# Patient Record
Sex: Male | Born: 1940 | Race: White | Hispanic: No | Marital: Married | Smoking: Never smoker
Health system: Southern US, Community
[De-identification: ages and names within clinical notes are randomized; demographics above are authoritative.]

## PROBLEM LIST (undated history)

## (undated) DIAGNOSIS — C4491 Basal cell carcinoma of skin, unspecified: Secondary | ICD-10-CM

## (undated) DIAGNOSIS — IMO0002 Reserved for concepts with insufficient information to code with codable children: Secondary | ICD-10-CM

## (undated) DIAGNOSIS — Z8546 Personal history of malignant neoplasm of prostate: Secondary | ICD-10-CM

## (undated) DIAGNOSIS — Z860101 Personal history of adenomatous and serrated colon polyps: Secondary | ICD-10-CM

## (undated) DIAGNOSIS — K648 Other hemorrhoids: Secondary | ICD-10-CM

## (undated) DIAGNOSIS — Z86010 Personal history of colonic polyps: Secondary | ICD-10-CM

## (undated) DIAGNOSIS — S0300XA Dislocation of jaw, unspecified side, initial encounter: Secondary | ICD-10-CM

## (undated) DIAGNOSIS — E785 Hyperlipidemia, unspecified: Secondary | ICD-10-CM

## (undated) DIAGNOSIS — K7689 Other specified diseases of liver: Secondary | ICD-10-CM

## (undated) DIAGNOSIS — K222 Esophageal obstruction: Secondary | ICD-10-CM

## (undated) DIAGNOSIS — K5790 Diverticulosis of intestine, part unspecified, without perforation or abscess without bleeding: Secondary | ICD-10-CM

## (undated) DIAGNOSIS — K2970 Gastritis, unspecified, without bleeding: Secondary | ICD-10-CM

## (undated) DIAGNOSIS — K219 Gastro-esophageal reflux disease without esophagitis: Secondary | ICD-10-CM

## (undated) DIAGNOSIS — R911 Solitary pulmonary nodule: Secondary | ICD-10-CM

## (undated) HISTORY — PX: MOHS SURGERY: SUR867

## (undated) HISTORY — DX: Gastro-esophageal reflux disease without esophagitis: K21.9

## (undated) HISTORY — DX: Reserved for concepts with insufficient information to code with codable children: IMO0002

## (undated) HISTORY — DX: Personal history of adenomatous and serrated colon polyps: Z86.0101

## (undated) HISTORY — PX: APPENDECTOMY: SHX54

## (undated) HISTORY — PX: ROTATOR CUFF REPAIR: SHX139

## (undated) HISTORY — DX: Personal history of malignant neoplasm of prostate: Z85.46

## (undated) HISTORY — DX: Gilbert syndrome: E80.4

## (undated) HISTORY — DX: Dislocation of jaw, unspecified side, initial encounter: S03.00XA

## (undated) HISTORY — DX: Gastritis, unspecified, without bleeding: K29.70

## (undated) HISTORY — DX: Basal cell carcinoma of skin, unspecified: C44.91

## (undated) HISTORY — DX: Other specified diseases of liver: K76.89

## (undated) HISTORY — DX: Solitary pulmonary nodule: R91.1

## (undated) HISTORY — DX: Diverticulosis of intestine, part unspecified, without perforation or abscess without bleeding: K57.90

## (undated) HISTORY — DX: Personal history of colonic polyps: Z86.010

## (undated) HISTORY — DX: Esophageal obstruction: K22.2

## (undated) HISTORY — DX: Hyperlipidemia, unspecified: E78.5

## (undated) HISTORY — DX: Other hemorrhoids: K64.8

---

## 1983-06-11 HISTORY — PX: CHOLECYSTECTOMY: SHX55

## 2000-04-22 ENCOUNTER — Ambulatory Visit (HOSPITAL_COMMUNITY): Admission: RE | Admit: 2000-04-22 | Discharge: 2000-04-22 | Payer: Self-pay | Admitting: Gastroenterology

## 2000-04-23 ENCOUNTER — Encounter (INDEPENDENT_AMBULATORY_CARE_PROVIDER_SITE_OTHER): Payer: Self-pay | Admitting: *Deleted

## 2000-09-27 ENCOUNTER — Emergency Department (HOSPITAL_COMMUNITY): Admission: EM | Admit: 2000-09-27 | Discharge: 2000-09-28 | Payer: Self-pay | Admitting: Emergency Medicine

## 2000-09-28 ENCOUNTER — Encounter: Payer: Self-pay | Admitting: Internal Medicine

## 2000-10-29 ENCOUNTER — Observation Stay (HOSPITAL_COMMUNITY): Admission: EM | Admit: 2000-10-29 | Discharge: 2000-10-30 | Payer: Self-pay | Admitting: Emergency Medicine

## 2000-10-29 ENCOUNTER — Encounter (INDEPENDENT_AMBULATORY_CARE_PROVIDER_SITE_OTHER): Payer: Self-pay | Admitting: Specialist

## 2002-06-10 HISTORY — PX: COLONOSCOPY W/ POLYPECTOMY: SHX1380

## 2002-06-10 HISTORY — PX: ESOPHAGEAL DILATION: SHX303

## 2004-02-09 ENCOUNTER — Encounter: Payer: Self-pay | Admitting: Internal Medicine

## 2004-04-30 ENCOUNTER — Ambulatory Visit: Payer: Self-pay | Admitting: Internal Medicine

## 2004-06-28 ENCOUNTER — Ambulatory Visit: Payer: Self-pay | Admitting: Internal Medicine

## 2004-08-14 ENCOUNTER — Ambulatory Visit: Payer: Self-pay | Admitting: Internal Medicine

## 2004-09-27 ENCOUNTER — Ambulatory Visit: Payer: Self-pay | Admitting: Internal Medicine

## 2004-10-03 ENCOUNTER — Ambulatory Visit: Payer: Self-pay | Admitting: Internal Medicine

## 2005-03-19 ENCOUNTER — Ambulatory Visit: Payer: Self-pay | Admitting: Internal Medicine

## 2005-04-09 ENCOUNTER — Ambulatory Visit: Payer: Self-pay | Admitting: Internal Medicine

## 2005-05-23 ENCOUNTER — Ambulatory Visit: Payer: Self-pay | Admitting: Internal Medicine

## 2005-05-29 ENCOUNTER — Encounter: Admission: RE | Admit: 2005-05-29 | Discharge: 2005-05-29 | Payer: Self-pay | Admitting: Internal Medicine

## 2005-07-11 HISTORY — PX: PROSTATECTOMY: SHX69

## 2005-08-01 ENCOUNTER — Inpatient Hospital Stay (HOSPITAL_COMMUNITY): Admission: RE | Admit: 2005-08-01 | Discharge: 2005-08-02 | Payer: Self-pay | Admitting: Urology

## 2005-08-01 ENCOUNTER — Encounter (INDEPENDENT_AMBULATORY_CARE_PROVIDER_SITE_OTHER): Payer: Self-pay | Admitting: *Deleted

## 2005-10-02 ENCOUNTER — Ambulatory Visit: Payer: Self-pay | Admitting: Internal Medicine

## 2005-11-18 ENCOUNTER — Ambulatory Visit: Payer: Self-pay | Admitting: Internal Medicine

## 2006-01-23 ENCOUNTER — Ambulatory Visit: Payer: Self-pay | Admitting: Internal Medicine

## 2006-02-19 ENCOUNTER — Ambulatory Visit: Payer: Self-pay | Admitting: Internal Medicine

## 2006-02-25 ENCOUNTER — Ambulatory Visit: Payer: Self-pay | Admitting: Internal Medicine

## 2006-07-14 ENCOUNTER — Ambulatory Visit: Payer: Self-pay | Admitting: Internal Medicine

## 2006-08-18 ENCOUNTER — Ambulatory Visit: Payer: Self-pay | Admitting: Internal Medicine

## 2006-08-25 ENCOUNTER — Encounter: Admission: RE | Admit: 2006-08-25 | Discharge: 2006-08-25 | Payer: Self-pay | Admitting: Internal Medicine

## 2006-10-06 ENCOUNTER — Ambulatory Visit: Payer: Self-pay | Admitting: Internal Medicine

## 2006-10-06 LAB — CONVERTED CEMR LAB
Alkaline Phosphatase: 66 units/L (ref 39–117)
Basophils Relative: 0.4 % (ref 0.0–1.0)
Bilirubin, Direct: 0.1 mg/dL (ref 0.0–0.3)
CO2: 31 meq/L (ref 19–32)
Creatinine, Ser: 1 mg/dL (ref 0.4–1.5)
Eosinophils Relative: 7.5 % — ABNORMAL HIGH (ref 0.0–5.0)
Glucose, Bld: 97 mg/dL (ref 70–99)
HCT: 41.8 % (ref 39.0–52.0)
Hemoglobin: 14.3 g/dL (ref 13.0–17.0)
LDL Cholesterol: 58 mg/dL (ref 0–99)
Lymphocytes Relative: 28.5 % (ref 12.0–46.0)
Monocytes Absolute: 0.5 10*3/uL (ref 0.2–0.7)
Neutrophils Relative %: 52.8 % (ref 43.0–77.0)
Potassium: 4.4 meq/L (ref 3.5–5.1)
RDW: 12.2 % (ref 11.5–14.6)
Sodium: 144 meq/L (ref 135–145)
TSH: 1.27 microintl units/mL (ref 0.35–5.50)
Total Bilirubin: 1 mg/dL (ref 0.3–1.2)
Total Protein: 6.6 g/dL (ref 6.0–8.3)
VLDL: 22 mg/dL (ref 0–40)

## 2006-10-20 ENCOUNTER — Encounter: Admission: RE | Admit: 2006-10-20 | Discharge: 2006-10-20 | Payer: Self-pay | Admitting: Specialist

## 2006-10-29 DIAGNOSIS — C4490 Unspecified malignant neoplasm of skin, unspecified: Secondary | ICD-10-CM | POA: Insufficient documentation

## 2006-12-08 ENCOUNTER — Ambulatory Visit: Payer: Self-pay | Admitting: Family Medicine

## 2007-02-12 ENCOUNTER — Telehealth (INDEPENDENT_AMBULATORY_CARE_PROVIDER_SITE_OTHER): Payer: Self-pay | Admitting: *Deleted

## 2007-03-03 ENCOUNTER — Ambulatory Visit: Payer: Self-pay | Admitting: Internal Medicine

## 2007-03-03 DIAGNOSIS — E785 Hyperlipidemia, unspecified: Secondary | ICD-10-CM | POA: Insufficient documentation

## 2007-03-23 ENCOUNTER — Encounter: Payer: Self-pay | Admitting: Internal Medicine

## 2007-04-21 ENCOUNTER — Ambulatory Visit: Payer: Self-pay | Admitting: Internal Medicine

## 2007-06-01 ENCOUNTER — Telehealth (INDEPENDENT_AMBULATORY_CARE_PROVIDER_SITE_OTHER): Payer: Self-pay | Admitting: *Deleted

## 2007-06-19 ENCOUNTER — Ambulatory Visit: Payer: Self-pay | Admitting: Internal Medicine

## 2007-06-29 LAB — CONVERTED CEMR LAB
Basophils Absolute: 0 10*3/uL (ref 0.0–0.1)
Eosinophils Absolute: 0.2 10*3/uL (ref 0.0–0.6)
Eosinophils Relative: 3.5 % (ref 0.0–5.0)
MCV: 95.9 fL (ref 78.0–100.0)
Platelets: 163 10*3/uL (ref 150–400)
RBC: 4.17 M/uL — ABNORMAL LOW (ref 4.22–5.81)
TSH: 1.52 microintl units/mL (ref 0.35–5.50)
WBC: 5.4 10*3/uL (ref 4.5–10.5)

## 2007-08-03 ENCOUNTER — Emergency Department (HOSPITAL_COMMUNITY): Admission: EM | Admit: 2007-08-03 | Discharge: 2007-08-03 | Payer: Self-pay | Admitting: Family Medicine

## 2007-09-01 ENCOUNTER — Ambulatory Visit: Payer: Self-pay | Admitting: Internal Medicine

## 2007-09-03 ENCOUNTER — Ambulatory Visit: Payer: Self-pay | Admitting: Internal Medicine

## 2007-09-03 DIAGNOSIS — K219 Gastro-esophageal reflux disease without esophagitis: Secondary | ICD-10-CM | POA: Insufficient documentation

## 2007-09-04 ENCOUNTER — Encounter (INDEPENDENT_AMBULATORY_CARE_PROVIDER_SITE_OTHER): Payer: Self-pay | Admitting: *Deleted

## 2007-09-07 ENCOUNTER — Encounter (INDEPENDENT_AMBULATORY_CARE_PROVIDER_SITE_OTHER): Payer: Self-pay | Admitting: *Deleted

## 2007-09-14 ENCOUNTER — Encounter: Payer: Self-pay | Admitting: Internal Medicine

## 2007-09-14 ENCOUNTER — Ambulatory Visit: Payer: Self-pay

## 2007-09-17 ENCOUNTER — Ambulatory Visit: Payer: Self-pay | Admitting: Internal Medicine

## 2007-09-17 ENCOUNTER — Encounter: Payer: Self-pay | Admitting: Internal Medicine

## 2007-09-17 ENCOUNTER — Encounter (INDEPENDENT_AMBULATORY_CARE_PROVIDER_SITE_OTHER): Payer: Self-pay | Admitting: *Deleted

## 2007-09-22 ENCOUNTER — Encounter: Payer: Self-pay | Admitting: Internal Medicine

## 2007-11-09 ENCOUNTER — Ambulatory Visit: Payer: Self-pay | Admitting: Internal Medicine

## 2007-11-10 ENCOUNTER — Ambulatory Visit: Payer: Self-pay | Admitting: Internal Medicine

## 2007-11-10 ENCOUNTER — Encounter: Payer: Self-pay | Admitting: Family Medicine

## 2007-11-12 ENCOUNTER — Encounter (INDEPENDENT_AMBULATORY_CARE_PROVIDER_SITE_OTHER): Payer: Self-pay | Admitting: *Deleted

## 2007-11-13 LAB — CONVERTED CEMR LAB
Magnesium: 2.8 mg/dL — ABNORMAL HIGH (ref 1.5–2.5)
Total CK: 103 units/L (ref 7–195)

## 2007-11-16 ENCOUNTER — Ambulatory Visit: Payer: Self-pay | Admitting: Internal Medicine

## 2007-11-16 LAB — CONVERTED CEMR LAB
Bilirubin Urine: NEGATIVE
Creatinine, Ser: 1 mg/dL (ref 0.4–1.5)
Eosinophils Relative: 4.5 % (ref 0.0–5.0)
Glucose, Urine, Semiquant: NEGATIVE
Monocytes Relative: 6.1 % (ref 3.0–12.0)
Neutrophils Relative %: 62 % (ref 43.0–77.0)
Platelets: 148 10*3/uL — ABNORMAL LOW (ref 150–400)
Specific Gravity, Urine: 1.02
WBC: 5.3 10*3/uL (ref 4.5–10.5)
pH: 6.5

## 2007-11-17 ENCOUNTER — Ambulatory Visit: Payer: Self-pay | Admitting: Internal Medicine

## 2007-12-01 ENCOUNTER — Ambulatory Visit: Payer: Self-pay | Admitting: Internal Medicine

## 2007-12-01 DIAGNOSIS — K7689 Other specified diseases of liver: Secondary | ICD-10-CM | POA: Insufficient documentation

## 2007-12-01 DIAGNOSIS — R930 Abnormal findings on diagnostic imaging of skull and head, not elsewhere classified: Secondary | ICD-10-CM | POA: Insufficient documentation

## 2008-01-12 ENCOUNTER — Ambulatory Visit: Payer: Self-pay | Admitting: Internal Medicine

## 2008-01-12 DIAGNOSIS — Z8546 Personal history of malignant neoplasm of prostate: Secondary | ICD-10-CM | POA: Insufficient documentation

## 2008-01-12 LAB — CONVERTED CEMR LAB
Cholesterol, target level: 200 mg/dL
HDL goal, serum: 40 mg/dL

## 2008-01-18 LAB — CONVERTED CEMR LAB
ALT: 30 units/L (ref 0–53)
AST: 29 units/L (ref 0–37)
Albumin: 4.1 g/dL (ref 3.5–5.2)
BUN: 16 mg/dL (ref 6–23)
Basophils Absolute: 0.1 10*3/uL (ref 0.0–0.1)
Basophils Relative: 2 % (ref 0.0–3.0)
CO2: 28 meq/L (ref 19–32)
Calcium: 9.2 mg/dL (ref 8.4–10.5)
Cholesterol: 149 mg/dL (ref 0–200)
Creatinine, Ser: 1 mg/dL (ref 0.4–1.5)
Eosinophils Relative: 3.7 % (ref 0.0–5.0)
Hemoglobin: 15.7 g/dL (ref 13.0–17.0)
LDL Cholesterol: 82 mg/dL (ref 0–99)
Lymphocytes Relative: 29 % (ref 12.0–46.0)
MCHC: 34 g/dL (ref 30.0–36.0)
MCV: 98.3 fL (ref 78.0–100.0)
Neutro Abs: 2.8 10*3/uL (ref 1.4–7.7)
RBC: 4.68 M/uL (ref 4.22–5.81)
TSH: 1.36 microintl units/mL (ref 0.35–5.50)
Total Bilirubin: 1.8 mg/dL — ABNORMAL HIGH (ref 0.3–1.2)
Total Protein: 7.2 g/dL (ref 6.0–8.3)
WBC: 5.1 10*3/uL (ref 4.5–10.5)

## 2008-03-01 ENCOUNTER — Telehealth (INDEPENDENT_AMBULATORY_CARE_PROVIDER_SITE_OTHER): Payer: Self-pay | Admitting: *Deleted

## 2008-03-16 ENCOUNTER — Ambulatory Visit: Payer: Self-pay | Admitting: Internal Medicine

## 2008-03-23 ENCOUNTER — Encounter: Payer: Self-pay | Admitting: Internal Medicine

## 2008-04-08 ENCOUNTER — Ambulatory Visit: Payer: Self-pay | Admitting: Family Medicine

## 2008-04-08 LAB — CONVERTED CEMR LAB
Glucose, Urine, Semiquant: NEGATIVE
Nitrite: NEGATIVE
Specific Gravity, Urine: <1.005
WBC Urine, dipstick: NEGATIVE
pH: 6.5

## 2008-05-12 ENCOUNTER — Ambulatory Visit: Payer: Self-pay | Admitting: Internal Medicine

## 2008-05-12 LAB — CONVERTED CEMR LAB
OCCULT 1: NEGATIVE
OCCULT 2: NEGATIVE

## 2008-05-13 ENCOUNTER — Telehealth: Payer: Self-pay | Admitting: Internal Medicine

## 2008-05-13 DIAGNOSIS — H4040X0 Glaucoma secondary to eye inflammation, unspecified eye, stage unspecified: Secondary | ICD-10-CM | POA: Insufficient documentation

## 2008-05-17 ENCOUNTER — Telehealth: Payer: Self-pay | Admitting: Internal Medicine

## 2008-05-18 DIAGNOSIS — Z860100 Personal history of colon polyps, unspecified: Secondary | ICD-10-CM | POA: Insufficient documentation

## 2008-05-18 DIAGNOSIS — K648 Other hemorrhoids: Secondary | ICD-10-CM | POA: Insufficient documentation

## 2008-05-18 DIAGNOSIS — K222 Esophageal obstruction: Secondary | ICD-10-CM | POA: Insufficient documentation

## 2008-05-18 DIAGNOSIS — K5730 Diverticulosis of large intestine without perforation or abscess without bleeding: Secondary | ICD-10-CM | POA: Insufficient documentation

## 2008-05-19 ENCOUNTER — Ambulatory Visit: Payer: Self-pay | Admitting: Internal Medicine

## 2008-05-19 DIAGNOSIS — J984 Other disorders of lung: Secondary | ICD-10-CM | POA: Insufficient documentation

## 2008-05-19 LAB — CONVERTED CEMR LAB
ALT: 26 units/L (ref 0–53)
Albumin: 3.9 g/dL (ref 3.5–5.2)
Amylase: 115 units/L (ref 27–131)
Creatinine, Ser: 1.1 mg/dL (ref 0.4–1.5)
Total Protein: 7.1 g/dL (ref 6.0–8.3)

## 2008-05-23 ENCOUNTER — Ambulatory Visit: Payer: Self-pay | Admitting: Internal Medicine

## 2008-05-30 ENCOUNTER — Telehealth (INDEPENDENT_AMBULATORY_CARE_PROVIDER_SITE_OTHER): Payer: Self-pay | Admitting: *Deleted

## 2008-06-23 ENCOUNTER — Ambulatory Visit: Payer: Self-pay | Admitting: Internal Medicine

## 2008-08-15 ENCOUNTER — Encounter: Payer: Self-pay | Admitting: Internal Medicine

## 2008-08-26 ENCOUNTER — Ambulatory Visit: Payer: Self-pay | Admitting: Internal Medicine

## 2008-08-26 LAB — CONVERTED CEMR LAB
AST: 24 units/L (ref 0–37)
Albumin: 3.9 g/dL (ref 3.5–5.2)
Alkaline Phosphatase: 76 units/L (ref 39–117)
LDL Cholesterol: 75 mg/dL (ref 0–99)
Total Bilirubin: 1.4 mg/dL — ABNORMAL HIGH (ref 0.3–1.2)
Total CHOL/HDL Ratio: 4

## 2008-09-02 ENCOUNTER — Ambulatory Visit: Payer: Self-pay | Admitting: Internal Medicine

## 2008-09-21 ENCOUNTER — Encounter: Payer: Self-pay | Admitting: Internal Medicine

## 2008-12-15 ENCOUNTER — Encounter: Payer: Self-pay | Admitting: Internal Medicine

## 2008-12-27 ENCOUNTER — Telehealth (INDEPENDENT_AMBULATORY_CARE_PROVIDER_SITE_OTHER): Payer: Self-pay | Admitting: *Deleted

## 2008-12-28 ENCOUNTER — Encounter (INDEPENDENT_AMBULATORY_CARE_PROVIDER_SITE_OTHER): Payer: Self-pay | Admitting: *Deleted

## 2009-01-17 ENCOUNTER — Ambulatory Visit: Payer: Self-pay | Admitting: Internal Medicine

## 2009-01-17 DIAGNOSIS — M26629 Arthralgia of temporomandibular joint, unspecified side: Secondary | ICD-10-CM | POA: Insufficient documentation

## 2009-01-17 DIAGNOSIS — H9190 Unspecified hearing loss, unspecified ear: Secondary | ICD-10-CM | POA: Insufficient documentation

## 2009-01-20 ENCOUNTER — Encounter (INDEPENDENT_AMBULATORY_CARE_PROVIDER_SITE_OTHER): Payer: Self-pay | Admitting: *Deleted

## 2009-03-29 ENCOUNTER — Encounter: Payer: Self-pay | Admitting: Internal Medicine

## 2009-03-30 ENCOUNTER — Encounter: Payer: Self-pay | Admitting: Internal Medicine

## 2009-03-30 ENCOUNTER — Ambulatory Visit (HOSPITAL_COMMUNITY): Admission: RE | Admit: 2009-03-30 | Discharge: 2009-03-30 | Payer: Self-pay | Admitting: Internal Medicine

## 2009-04-06 ENCOUNTER — Ambulatory Visit: Payer: Self-pay | Admitting: Internal Medicine

## 2009-05-16 ENCOUNTER — Encounter: Payer: Self-pay | Admitting: Internal Medicine

## 2009-06-06 ENCOUNTER — Ambulatory Visit: Payer: Self-pay | Admitting: Internal Medicine

## 2009-07-21 ENCOUNTER — Ambulatory Visit: Payer: Self-pay | Admitting: Family Medicine

## 2009-07-24 ENCOUNTER — Ambulatory Visit: Payer: Self-pay | Admitting: Internal Medicine

## 2009-08-29 ENCOUNTER — Ambulatory Visit: Payer: Self-pay | Admitting: Internal Medicine

## 2009-08-30 ENCOUNTER — Telehealth (INDEPENDENT_AMBULATORY_CARE_PROVIDER_SITE_OTHER): Payer: Self-pay | Admitting: *Deleted

## 2009-10-06 ENCOUNTER — Encounter: Payer: Self-pay | Admitting: Internal Medicine

## 2009-10-17 ENCOUNTER — Encounter: Payer: Self-pay | Admitting: Internal Medicine

## 2009-10-26 ENCOUNTER — Telehealth (INDEPENDENT_AMBULATORY_CARE_PROVIDER_SITE_OTHER): Payer: Self-pay | Admitting: *Deleted

## 2010-01-15 ENCOUNTER — Ambulatory Visit: Payer: Self-pay | Admitting: Internal Medicine

## 2010-01-15 DIAGNOSIS — M940 Chondrocostal junction syndrome [Tietze]: Secondary | ICD-10-CM | POA: Insufficient documentation

## 2010-01-16 ENCOUNTER — Encounter: Payer: Self-pay | Admitting: Internal Medicine

## 2010-01-19 ENCOUNTER — Ambulatory Visit: Payer: Self-pay | Admitting: Internal Medicine

## 2010-01-22 ENCOUNTER — Telehealth: Payer: Self-pay | Admitting: Internal Medicine

## 2010-01-22 LAB — CONVERTED CEMR LAB
ALT: 29 units/L (ref 0–53)
AST: 29 units/L (ref 0–37)
Albumin: 4.3 g/dL (ref 3.5–5.2)
Alkaline Phosphatase: 63 units/L (ref 39–117)
Basophils Absolute: 0 10*3/uL (ref 0.0–0.1)
Calcium: 9.3 mg/dL (ref 8.4–10.5)
Eosinophils Relative: 3.3 % (ref 0.0–5.0)
GFR calc non Af Amer: 80.08 mL/min (ref >60)
Glucose, Bld: 95 mg/dL (ref 70–99)
HDL: 39.5 mg/dL (ref >39.00)
Hemoglobin: 15.3 g/dL (ref 13.0–17.0)
Lymphocytes Relative: 27.6 % (ref 12.0–46.0)
Monocytes Relative: 11.2 % (ref 3.0–12.0)
Neutro Abs: 2.8 10*3/uL (ref 1.4–7.7)
Potassium: 4.6 meq/L (ref 3.5–5.1)
RDW: 12.9 % (ref 11.5–14.6)
Sodium: 142 meq/L (ref 135–145)
Total Bilirubin: 1.6 mg/dL — ABNORMAL HIGH (ref 0.3–1.2)
Total CHOL/HDL Ratio: 4
Triglycerides: 225 mg/dL — ABNORMAL HIGH (ref 0.0–149.0)
VLDL: 45 mg/dL — ABNORMAL HIGH (ref 0.0–40.0)
WBC: 5 10*3/uL (ref 4.5–10.5)

## 2010-01-29 ENCOUNTER — Ambulatory Visit: Payer: Self-pay | Admitting: Internal Medicine

## 2010-02-08 ENCOUNTER — Ambulatory Visit: Payer: Self-pay | Admitting: Internal Medicine

## 2010-03-19 ENCOUNTER — Ambulatory Visit: Payer: Self-pay | Admitting: Internal Medicine

## 2010-03-19 DIAGNOSIS — R0789 Other chest pain: Secondary | ICD-10-CM | POA: Insufficient documentation

## 2010-03-20 ENCOUNTER — Telehealth (INDEPENDENT_AMBULATORY_CARE_PROVIDER_SITE_OTHER): Payer: Self-pay | Admitting: *Deleted

## 2010-05-24 ENCOUNTER — Ambulatory Visit: Payer: Self-pay | Admitting: Internal Medicine

## 2010-05-31 LAB — CONVERTED CEMR LAB
HDL: 35 mg/dL — ABNORMAL LOW (ref >39.00)
LDL Cholesterol: 86 mg/dL (ref 0–99)
Total CHOL/HDL Ratio: 4
Triglycerides: 104 mg/dL (ref 0.0–149.0)

## 2010-06-10 HISTORY — PX: OTHER SURGICAL HISTORY: SHX169

## 2010-06-15 ENCOUNTER — Telehealth (INDEPENDENT_AMBULATORY_CARE_PROVIDER_SITE_OTHER): Payer: Self-pay | Admitting: *Deleted

## 2010-06-20 ENCOUNTER — Encounter: Payer: Self-pay | Admitting: Internal Medicine

## 2010-07-04 ENCOUNTER — Other Ambulatory Visit: Payer: Self-pay | Admitting: Dermatology

## 2010-07-08 LAB — CONVERTED CEMR LAB
ALT: 27 units/L (ref 0–53)
AST: 29 units/L (ref 0–37)
Alkaline Phosphatase: 63 units/L (ref 39–117)
BUN: 14 mg/dL (ref 6–23)
Basophils Absolute: 0 10*3/uL (ref 0.0–0.1)
Bilirubin, Direct: 0.1 mg/dL (ref 0.0–0.3)
Calcium: 9.1 mg/dL (ref 8.4–10.5)
Cholesterol, target level: 200 mg/dL
Cholesterol: 166 mg/dL (ref 0–200)
Eosinophils Relative: 2.8 % (ref 0.0–5.0)
GFR calc non Af Amer: 78.46 mL/min (ref >60)
Glucose, Bld: 86 mg/dL (ref 70–99)
HCT: 44.7 % (ref 39.0–52.0)
HDL: 35.5 mg/dL — ABNORMAL LOW (ref >39.00)
LDL Cholesterol: 95 mg/dL (ref 0–99)
Lymphocytes Relative: 29.1 % (ref 12.0–46.0)
Monocytes Relative: 10.5 % (ref 3.0–12.0)
Platelets: 144 10*3/uL — ABNORMAL LOW (ref 150.0–400.0)
Potassium: 4 meq/L (ref 3.5–5.1)
RDW: 12.1 % (ref 11.5–14.6)
Sodium: 142 meq/L (ref 135–145)
TSH: 1.09 microintl units/mL (ref 0.35–5.50)
Total Bilirubin: 1.7 mg/dL — ABNORMAL HIGH (ref 0.3–1.2)
Troponin I: 0.02 ng/mL (ref <0.06)
VLDL: 35.2 mg/dL (ref 0.0–40.0)
WBC: 4.9 10*3/uL (ref 4.5–10.5)

## 2010-07-12 NOTE — Assessment & Plan Note (Signed)
Summary: cough, fever, headache//fd   Vital Signs:  Patient profile:   70 year old male Weight:      169.13 pounds Temp:     98.2 degrees F oral Pulse rate:   65 / minute Pulse rhythm:   regular BP sitting:   122 / 80  (left arm) Cuff size:   regular  Vitals Entered By: Army Fossa CMA (July 21, 2009 1:12 PM) CC: [name] (highest 101.2), chest congestion, and headache, Cough   History of Present Illness:  Cough      This is a 70 year old man who presents with Cough.  The symptoms began 3 days ago.  Pt using cheratussin and mucinex but he has had high fevers.  He is also taking IB.  The patient reports non-productive cough and fever, but denies productive cough, pleuritic chest pain, shortness of breath, wheezing, exertional dyspnea, hemoptysis, and malaise.  The patient denies the following symptoms: cold/URI symptoms, sore throat, nasal congestion, chronic rhinitis, weight loss, acid reflux symptoms, and peripheral edema.  Ineffective prior treatments have included OTC cough medication.    Allergies: 1)  ! Asa 2)  ! Relafen 3)  ! * Heavy Pain Meds 4)  ! Codeine  Past History:  Past Medical History: Last updated: 01/17/2009 Low back pain,DDD Skin cancer, hx of, Basal Cell & Squamous Cell, Dr Nicholas Lose Hx of GASTRITIS, HX OF (ICD-V12.79) Hx of ESOPHAGEAL STRICTURE (ICD-530.3), Dr [name] COLONIC POLYPS, ADENOMATOUS, HX OF (ICD-V12.72) HEMORRHOIDS, INTERNAL (ICD-455.0) DIVERTICULOSIS OF COLON (ICD-562.10) GLAUCOMA ASSOCIATED WITH OCULAR INFLAMMATIONS (ICD-365.62) IBS (ICD-564.1) PROSTATE CANCER, HX OF (ICD-V10.46), Dr Laverle Patter NONSPECIFIC ABNORM FIND RAD&OTH EXAM LUNG FIELD (ICD-793.1) HEPATIC CYST (ICD-573.8) GERD (ICD-530.81) HYPERLIPIDEMIA (ICD-272.2): LDL goal = < 100 Gilbert's Syndrome  Past Surgical History: Last updated: 01/17/2009 Endo 2001: stricture, neg 2009 Cholecystectomy1995 Colon polypectomy 2004,neg 2009, Dr [name] Prostatectomy 2/07, Dr  Laverle Patter Rotator cuff repair X 2 ,L  shoulder Appendectomy  Family History: Last updated: 01/17/2009 Father: MM Mother: colon CA Siblings: bro COAD M uncle MI,d 38  Social History: Last updated: 01/17/2009 no diet Occupation: Retired Alcohol Use - yes: socially Illicit Drug Use - no Never Smoked Regular exercise-yes: decreased in Summer, usually 5X/week for 2-3 mpd   Risk Factors: Exercise: yes (01/17/2009)  Risk Factors: Smoking Status: never (01/17/2009)  Family History: Reviewed history from 01/17/2009 and no changes required. Father: MM Mother: colon CA Siblings: bro COAD M uncle MI,d 80  Social History: Reviewed history from 01/17/2009 and no changes required. no diet Occupation: Retired Alcohol Use - yes: socially Illicit Drug Use - no Never Smoked Regular exercise-yes: decreased in Summer, usually 5X/week for 2-3 mpd   Review of Systems      See HPI  Physical Exam  General:  Well-developed,well-nourished,in no acute distress; alert,appropriate and cooperative throughout examination Ears:  External ear exam shows no significant lesions or deformities.  Otoscopic examination reveals clear canals, tympanic membranes are intact bilaterally without bulging, retraction, inflammation or discharge. Hearing is grossly normal bilaterally. Nose:  External nasal examination shows no deformity or inflammation. Nasal mucosa are pink and moist without lesions or exudates. Mouth:  Oral mucosa and oropharynx without lesions or exudates.  Teeth in good repair. Neck:  No deformities, masses, or tenderness noted. Lungs:  bibasilar rales L>R Heart:  normal rate and no murmur.   Skin:  Intact without suspicious lesions or rashes Psych:  Oriented X3 and normally interactive.     Impression & Recommendations:  Problem # 1:  ACUTE BRONCHITIS (ICD-466.0)  r/o pneumonia The following medications were removed from the medication list:    Azithromycin 250 Mg Tabs  (Azithromycin) .Marland Kitchen... As per pack His updated medication list for this problem includes:    Benzonatate 200 Mg Caps (Benzonatate) .Marland Kitchen... 1  q 8 hrs as needed cough    Avelox 400 Mg Tabs (Moxifloxacin hcl) .Marland Kitchen... 1 by mouth once daily    Cheratussin Ac 100-10 Mg/22ml Syrp (Guaifenesin-codeine) .Marland Kitchen... 1-2 tsp by mouth at bedtime    mucinex for day cough  Orders: T-2 View CXR (71020TC)  Take antibiotics and other medications as directed. Encouraged to push clear liquids, get enough rest, and take acetaminophen as needed. To be seen in 5-7 days if no improvement, sooner if worse.  Complete Medication List: 1)  Xalatan Soln (Latanoprost soln) 2)  Prilosec 20 Mg Cpdr (Omeprazole) .... Take 1 tablet by mouth two times a day 3)  Lorazepam 1 Mg Tabs (Lorazepam) .Marland Kitchen.. 1 by mouth once daily prn 4)  Cialis 20 Mg Tabs (Tadalafil) .... Every other day 5)  Folic Acid 1000mg   6)  Tramadol Hcl 50 Mg Tabs (Tramadol hcl) .Marland Kitchen.. 1 by mouth once daily as needed 7)  Multivitamin Without Iron  8)  Flaxseed Oil 2000mg   .... 2 tab qd 9)  Polyethylene Glycol 3350 Powd (Polyethylene glycol 3350) .... Dissolve 17 gms (1 capful) in 8 oz of liquid and take daily.  disp 1 large bottle 10)  Crestor 20 Mg Tabs (Rosuvastatin calcium) .Marland Kitchen.. 1 by mouth at bedtime 11)  Benzonatate 200 Mg Caps (Benzonatate) .Marland Kitchen.. 1  q 8 hrs as needed cough 12)  Avelox 400 Mg Tabs (Moxifloxacin hcl) .Marland Kitchen.. 1 by mouth once daily 13)  Cheratussin Ac 100-10 Mg/88ml Syrp (Guaifenesin-codeine) .Marland Kitchen.. 1-2 tsp by mouth at bedtime Prescriptions: AVELOX 400 MG TABS (MOXIFLOXACIN HCL) 1 by mouth once daily  #10 x 0   Entered and Authorized by:   Loreen Freud DO   Signed by:   Loreen Freud DO on 07/21/2009   Method used:   Electronically to        [facility]* (retail)       [address] [place], [place]  [address]       Ph: [phone] or [phone]       Fax: [phone]   RxID:    [id number]   Laboratory Results    Other Tests  Influenza A: negative Comments: Army Fossa CMA  July 21, 2009 1:35 PM

## 2010-07-12 NOTE — Medication Information (Signed)
Summary: [facility]  [facility]   Imported By: Lanelle Bal 06/27/2010 15:10:59  _____________________________________________________________________  External Attachment:    Type:   Image     Comment:   External Document

## 2010-07-12 NOTE — Consult Note (Signed)
Summary: [facility]  [facility]   Imported By: Lanelle Bal 11/07/2009 12:46:54  _____________________________________________________________________  External Attachment:    Type:   Image     Comment:   External Document

## 2010-07-12 NOTE — Letter (Signed)
Summary: [facility]  [facility]   Imported By: Lanelle Bal 10/16/2009 13:50:36  _____________________________________________________________________  External Attachment:    Type:   Image     Comment:   External Document

## 2010-07-12 NOTE — Assessment & Plan Note (Signed)
Summary: FELL FRIDAY HIT SIDE//KN   Vital Signs:  Patient profile:   70 year old male Weight:      167.8 pounds BMI:     26.77 Temp:     97.7 degrees F oral Pulse rate:   60 / minute Resp:     16 per minute BP sitting:   122 / 70  (left arm) Cuff size:   large  Vitals Entered By: Shonna Chock CMA (March 19, 2010 2:42 PM) CC: Patient injured right side playing soccer   Primary Care Provider:  Marga Melnick, MD  CC:  Patient injured right side playing soccer.  History of Present Illness:      This is a 70 year old male who presents with chest wall  pain since a fall 03/16/2010 while playing soccer with his grandson  The patient reports resting chest pain  @ the R  inferior rib cage, worse with  lifting .He  denies nausea, vomiting, diaphoresis, shortness of breath, and indigestion.  The pain is described as intermittent and burning.   The pain is brought on or made worse by lying in RLLDP  The pain is relieved or improved with change in position, standing up.  Rx: Tramadol , Tylenol  Current Medications (verified): 1)  Xalatan   Soln (Latanoprost Soln) 2)  Prilosec 20 Mg  Cpdr (Omeprazole) .... Take 1 Tablet By Mouth Two Times A Day 3)  Lorazepam 1 Mg  Tabs (Lorazepam) .Marland Kitchen.. 1 By Mouth Once Daily Prn 4)  Cialis 20 Mg  Tabs (Tadalafil) .... Every Other Day 5)  Folic Acid 1000mg  6)  Multivitamin Without Iron 7)  Flaxseed Oil 2000mg  .... 2 Tab Qd 8)  Crestor 20 Mg Tabs (Rosuvastatin Calcium) .Marland Kitchen.. 1 By Mouth At Bedtime 9)  Tramadol Hcl 50 Mg Tabs (Tramadol Hcl) .Marland Kitchen.. 1 Every 6 Hrs As Needed Pain 10)  Glucosamine 500 Mg Caps (Glucosamine Sulfate) .... 2 By Mouth Am  Allergies: 1)  ! Asa 2)  ! Relafen 3)  ! * Heavy Pain Meds 4)  ! Codeine  Physical Exam  General:  well-nourished,in no acute distress; alert,appropriate and cooperative throughout examination Chest Wall:  costochondrial tenderness especially R ant /inferior rib cage .   Lungs:  Normal respiratory effort, chest  expands symmetrically. Lungs are clear to auscultation, no crackles or wheezes.No rub Heart:  regular rhythm, no murmur, no gallop, no rub, and bradycardia.   Skin:  Intact without suspicious lesions or rashes Cervical Nodes:  No lymphadenopathy noted Axillary Nodes:  No palpable lymphadenopathy   Impression & Recommendations:  Problem # 1:  CHEST PAIN (ICD-786.50)  chest wall pain post fall  Orders: T-2 View CXR (71020TC) T-Ribs Unilateral 2 Views (71100TC)  Complete Medication List: 1)  Xalatan Soln (Latanoprost soln) 2)  Prilosec 20 Mg Cpdr (Omeprazole) .... Take 1 tablet by mouth two times a day 3)  Lorazepam 1 Mg Tabs (Lorazepam) .Marland Kitchen.. 1 by mouth once daily prn 4)  Cialis 20 Mg Tabs (Tadalafil) .... Every other day 5)  Folic Acid 1000mg   6)  Multivitamin Without Iron  7)  Flaxseed Oil 2000mg   .... 2 tab qd 8)  Crestor 20 Mg Tabs (Rosuvastatin calcium) .Marland Kitchen.. 1 by mouth at bedtime 9)  Tramadol Hcl 50 Mg Tabs (Tramadol hcl) .Marland Kitchen.. 1 every 6 hrs as needed pain 10)  Glucosamine 500 Mg Caps (Glucosamine sulfate) .... 2 by mouth am  Patient Instructions: 1)  Avoid injury to the rib cage; no soccer or other contact  sports Prescriptions: TRAMADOL HCL 50 MG TABS (TRAMADOL HCL) 1 every 6 hrs as needed pain  #30 x 5   Entered and Authorized by:   Marga Melnick MD   Signed by:   Marga Melnick MD on 03/19/2010   Method used:   Faxed to ...       [facility]* (retail)       [address] [place], [place]  [address]       Ph: [phone] or [phone]       Fax: [phone]   RxID:   [id number]

## 2010-07-12 NOTE — Progress Notes (Signed)
Summary: Referral  Phone Note Call from Patient   Caller: Patient Summary of Call: Pt would like to know the name of the Doctor that was recommended to him by Dr. [name] for the Audiologist. [phone]. Initial call taken by: Lavell Islam,  January 22, 2010 8:58 AM  Follow-up for Phone Call        Pauline Good , phone  [phone] Follow-up by: Marga Melnick MD,  January 22, 2010 3:48 PM  Additional Follow-up for Phone Call Additional follow up Details #1::        pt wife aware...........Marland KitchenFelecia Deloach CMA  January 22, 2010 4:33 PM

## 2010-07-12 NOTE — Assessment & Plan Note (Signed)
Summary: YEARLY EXAM///SPH   Vital Signs:  Patient profile:   70 year old male Height:      66.5 inches Weight:      164 pounds BMI:     26.17 Temp:     97.6 degrees F oral Pulse rate:   56 / minute Resp:     14 per minute BP sitting:   110 / 62  (left arm) Cuff size:   large  Vitals Entered By: Shonna Chock CMA (January 19, 2010 8:37 AM) CC: Yearly exam, fasting labs and refill Crestor   Primary Care Provider:  Marga Melnick, MD  CC:  Yearly exam and fasting labs and refill Crestor.  History of Present Illness: Here for Medicare AWV: 1.Risk factors based on Past M, S, F history:GERD;colon polyps;Dyslipidemia( chart updated) 2.Physical Activities: see data 3.Depression/mood: denied 4.Hearing: decreased whisper @ 6 ft. Audiology evaluation recommended. Hearing test @ [facility] 6 mos ago was abnormal. 5.ADL's:no limitations  6.Fall Risk:none   7.Home Safety: no issues 8.Height, weight, &visual acuity:wall chart read @ 6 ft with lenses 9.Counseling: none requested;POA up to date 10.Labs ordered based on risk factors: see Orders 11. Referral Coordination: see #4 12. Care Plan: see Instructions 13.Cognitive Assessment: Oriented X 3;memory & recall  normal ; "WORLD" spelled backwards; mood & affect normal. Hyperlipidemia Follow-Up      This is a 70 year old man who  also presents for Hyperlipidemia follow-up.  The patient denies muscle aches, GI upset, abdominal pain, flushing, itching, constipation, diarrhea, and fatigue.  The patient denies the following symptoms: chest pain/pressure, exercise intolerance, dypsnea, palpitations, syncope, and pedal edema.  Compliance with medications (by patient report) has been near 100%.  Dietary compliance has been good.  Adjunctive measures currently used by the patient include fish oil supplements.    Preventive Screening-Counseling & Management  Alcohol-Tobacco     Alcohol drinks/day: 1     Smoking Status: never  Caffeine-Diet-Exercise  Caffeine use/day: 2-3 cups     Diet Comments: modified heart healthy     Does Patient Exercise: no  Hep-HIV-STD-Contraception     Dental Visit-last 6 months yes     Sun Exposure-Excessive: no  Safety-Violence-Falls     Seat Belt Use: yes     Firearms in the Home: firearms in the home     Firearm Counseling: not indicated; uses recommended firearm safety measures     Smoke Detectors: yes     Violence in the Home: no risk noted     Sexual Abuse: no     Fall Risk: none      Sexual History:  currently monogamous.        Drug Use:  never.        Blood Transfusions:  no.        Travel History:  05/2009  to [place].    Current Medications (verified): 1)  Xalatan   Soln (Latanoprost Soln) 2)  Prilosec 20 Mg  Cpdr (Omeprazole) .... Take 1 Tablet By Mouth Two Times A Day 3)  Lorazepam 1 Mg  Tabs (Lorazepam) .Marland Kitchen.. 1 By Mouth Once Daily Prn 4)  Cialis 20 Mg  Tabs (Tadalafil) .... Every Other Day 5)  Folic Acid 1000mg  6)  Multivitamin Without Iron 7)  Flaxseed Oil 2000mg  .... 2 Tab Qd 8)  Crestor 20 Mg Tabs (Rosuvastatin Calcium) .Marland Kitchen.. 1 By Mouth At Bedtime 9)  Tramadol Hcl 50 Mg Tabs (Tramadol Hcl) .Marland Kitchen.. 1 Every 6 Hrs As Needed Pain  Allergies: 1)  !  Asa 2)  ! Relafen 3)  ! * Heavy Pain Meds 4)  ! Codeine  Past History:  Past Medical History: Low back pain,DDD Skin cancer, hx of, Basal Cell & Squamous Cell, Dr Nicholas Lose GASTRITIS,PMH  OF (ICD-V12.79) Hx of ESOPHAGEAL STRICTURE (ICD-530.3), Dr [name] COLONIC POLYPS, ADENOMATOUS, HX OF (ICD-V12.72) HEMORRHOIDS, INTERNAL (ICD-455.0) DIVERTICULOSIS OF COLON (ICD-562.10) GLAUCOMA ASSOCIATED WITH OCULAR INFLAMMATIONS (ICD-365.62) PROSTATE CANCER, HX OF (ICD-V10.46), Dr Laverle Patter NONSPECIFIC ABNORM FIND RAD&OTH EXAM LUNG FIELD (ICD-793.1) HEPATIC CYST (ICD-573.8) GERD (ICD-530.81) HYPERLIPIDEMIA (ICD-272.2): LDL goal = < 100 Gilbert's Syndrome  Past Surgical History: Endoscopy  2001: stricture, negative   2009 Cholecystectomy1985 Colon polypectomy 2004,negative  2009, Dr [name] Prostatectomy 07/2005, Dr Laverle Patter Rotator cuff repair X 2 ,L  shoulder Appendectomy  Family History: Father: Multiple Myeloma Mother: colon  cancer,CHF Siblings: bro: COAD M uncle: MI,d 31  Social History: Occupation: Retired Alcohol Use - yes: socially Illicit Drug Use - no Never Smoked Caffeine use/day:  2-3 cups Does Patient Exercise:  no Dental Care w/in 6 mos.:  yes Sun Exposure-Excessive:  no Seat Belt Use:  yes Fall Risk:  none Sexual History:  currently monogamous Drug Use:  never Blood Transfusions:  no  Review of Systems  The patient denies anorexia, fever, weight loss, weight gain, hoarseness, prolonged cough, hemoptysis, melena, hematochezia, severe indigestion/heartburn, hematuria, incontinence, unusual weight change, abnormal bleeding, enlarged lymph nodes, and angioedema.         GERD well controlled on PPI. Dr Laverle Patter seen 07/2009; PSA was 0.04.  Physical Exam  General:  well-nourished,appears  younger than age;alert,appropriate and cooperative throughout examination Head:  Normocephalic and atraumatic without obvious abnormalities. No apparent alopecia Eyes:  No corneal or conjunctival inflammation noted. EOMI. Perrla.  Ears:  External ear exam shows no significant lesions or deformities.  Otoscopic examination reveals clear canals, tympanic membranes are intact bilaterally without bulging, retraction, inflammation or discharge. Hearing is grossly normal bilaterally. Nose:  External nasal examination shows no deformity or inflammation. Nasal mucosa are pink and moist without lesions or exudates. Mouth:  Oral mucosa and oropharynx without lesions or exudates.  Teeth in good repair. Neck:  No deformities, masses, or tenderness noted. Lungs:  Normal respiratory effort, chest expands symmetrically. Lungs are clear to auscultation, no crackles or wheezes. Heart:  regular rhythm, no murmur,  no gallop, no rub, no JVD, no HJR, and bradycardia.   Abdomen:  Bowel sounds positive,abdomen soft and non-tender without masses, organomegaly or hernias noted. Genitalia:  Dr Laverle Patter Msk:  No deformity or scoliosis noted of thoracic or lumbar spine.   Pulses:  R and L carotid,radial,dorsalis pedis and posterior tibial pulses are full and equal bilaterally Extremities:  No clubbing, cyanosis, edema, or deformity noted with normal full range of motion of all joints.   Neurologic:  alert & oriented X3 and DTRs symmetrical and normal.   Skin:  Chronic solar changes with keratotic chnages Cervical Nodes:  No lymphadenopathy noted Axillary Nodes:  No palpable lymphadenopathy Psych:  memory intact for recent and remote, normally interactive, good eye contact, not anxious appearing, and not depressed appearing.     Impression & Recommendations:  Problem # 1:  PREVENTIVE HEALTH CARE (ICD-V70.0)  Orders: [facility] -Subsequent Annual Wellness Visit [phone]  Problem # 2:  HYPERLIPIDEMIA (ICD-272.2)  His updated medication list for this problem includes:    Crestor 20 Mg Tabs (Rosuvastatin calcium) .Marland Kitchen... 1 by mouth at bedtime  Orders: Venipuncture (60454) TLB-Lipid Panel (80061-LIPID) TLB-BMP (Basic Metabolic Panel-BMET) (80048-METABOL) TLB-Hepatic/Liver Function Pnl (  80076-HEPATIC) TLB-TSH (Thyroid Stimulating Hormone) (84443-TSH)  Problem # 3:  GERD (ICD-530.81)  Controlled His updated medication list for this problem includes:    Prilosec 20 Mg Cpdr (Omeprazole) .Marland Kitchen... 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 Take 1 tablet by mouth two times a day 3)  Lorazepam 1 Mg Tabs (Lorazepam) .Marland Kitchen.. 1 by mouth once daily prn 4)  Cialis 20 Mg Tabs (Tadalafil) .... Every other day 5)  Folic Acid 1000mg   6)  Multivitamin Without Iron  7)  Flaxseed Oil 2000mg   .... 2 tab qd 8)  Crestor 20 Mg Tabs (Rosuvastatin calcium) .Marland Kitchen.. 1 by mouth at bedtime 9)  Tramadol Hcl 50 Mg Tabs (Tramadol hcl) .Marland Kitchen.. 1 every 6 hrs as needed pain  Other Orders: Pneumococcal Vaccine (98119) Admin 1st Vaccine (14782)  Patient Instructions: 1)  Consider Audiology evaluation as discussed. 2)  It is important that you exercise regularly at least 20 minutes 5 times a week. If you develop chest pain, have severe difficulty breathing, or feel very tired , stop exercising immediately and seek medical attention. Prescriptions: CRESTOR 20 MG TABS (ROSUVASTATIN CALCIUM) 1 by mouth at bedtime  #90 x 3   Entered and Authorized by:   Marga Melnick MD   Signed by:   Marga Melnick MD on 01/19/2010   Method used:   Print then Give to Patient   RxID:   [id number]    Immunizations Administered:  Pneumonia Vaccine:    Vaccine Type: Pneumovax (Medicare)    Site: right deltoid    Mfr: Merck    Dose: 0.5 ml    Route: IM    Given by: Shonna Chock CMA    Exp. Date: 06/27/2011    Lot #: [id number]  Appended Document: YEARLY EXAM///SPH

## 2010-07-12 NOTE — Progress Notes (Signed)
Summary: Refill Request  Phone Note Refill Request Call back at [phone] Message from:  Pharmacy on Oct 26, 2009 2:16 PM  Refills Requested: Medication #1:  CRESTOR 20 MG TABS 1 by mouth at bedtime.   Dosage confirmed as above?Dosage Confirmed   Supply Requested: 3 months   Last Refilled: 07/31/2009 [facility]  Next Appointment Scheduled: 8.12.11 Initial call taken by: Harold Barban,  Oct 26, 2009 2:16 PM    Prescriptions: CRESTOR 20 MG TABS (ROSUVASTATIN CALCIUM) 1 by mouth at bedtime  #90 x 0   Entered by:   Shonna Chock   Authorized by:   Marga Melnick MD   Signed by:   Shonna Chock on 10/26/2009   Method used:   Electronically to        [facility]* (retail)       [address] [place], [place]  [address]       Ph: [phone] or [phone]       Fax: [phone]   RxID:   [id number]

## 2010-07-12 NOTE — Assessment & Plan Note (Signed)
Summary: Flu Vaccine.cbs   Nurse Visit   Allergies: 1)  ! Asa 2)  ! Relafen 3)  ! * Heavy Pain Meds 4)  ! Codeine  Immunization History:  Tetanus/Td Immunization History:    Tetanus/Td:  historical (12/08/2006)  Orders Added: 1)  Flu Vaccine 12yrs + [[address]] 2)  Administration Flu vaccine - MCR [G0008] Flu Vaccine Consent Questions     Do you have a history of severe allergic reactions to this vaccine? no    Any prior history of allergic reactions to egg and/or gelatin? no    Do you have a sensitivity to the preservative Thimersol? no    Do you have a past history of Guillan-Barre Syndrome? no    Do you currently have an acute febrile illness? no    Have you ever had a severe reaction to latex? no    Vaccine information given and explained to patient? yes    Are you currently pregnant? no    Lot Number:[id number]   Exp Date:12/08/2010   Site Given  Left Deltoid IMmedflu

## 2010-07-12 NOTE — Progress Notes (Signed)
Summary: Crestor    *pt will bring papers  Phone Note Refill Request Call back at Home Phone [phone] Message from:  Patient on June 15, 2010 3:53 PM  Refills Requested: Medication #1:  CRESTOR 20 MG TABS 1 by mouth at bedtime Patient notes that he has new insurance and can now do mail order. He would like to discuss the process. Left message on machine to call back to office.  Initial call taken by: Lucious Groves CMA,  June 15, 2010 3:53 PM  Follow-up for Phone Call        Patient notified of the process and will bring me his forms on monday so I can fax it for faster processing. Follow-up by: Lucious Groves CMA,  June 15, 2010 4:55 PM  Additional Follow-up for Phone Call Additional follow up Details #1::        RX and formed dropped off by patient faxed to [phone] Additional Follow-up by: Shonna Chock CMA,  June 20, 2010 11:31 AM    Prescriptions: CRESTOR 20 MG TABS (ROSUVASTATIN CALCIUM) 1 by mouth at bedtime  #90 x 3   Entered by:   Shonna Chock CMA   Authorized by:   Marga Melnick MD   Signed by:   Shonna Chock CMA on 06/20/2010   Method used:   Print then Give to Patient   RxID:   [id number]

## 2010-07-12 NOTE — Progress Notes (Signed)
Summary: -ray results  Phone Note Call from Patient   Summary of Call: Pt left VM that he would like to know results of x-ray. left message to call  office...........Marland KitchenFelecia Deloach CMA  August 30, 2009 4:03 PM    Good ; no fracture seen. Please take report copy to Dr Lestine Box. Initial call taken by: Jeremy Johann CMA,  August 30, 2009 4:03 PM  Follow-up for Phone Call        pt aware copy mailed to pt.Marland KitchenMarland KitchenMarland KitchenFelecia Deloach CMA  August 30, 2009 4:32 PM

## 2010-07-12 NOTE — Assessment & Plan Note (Signed)
Summary: pain shoulder/chest /felt tear when reaching/cbs   Vital Signs:  Patient profile:   70 year old male Weight:      168.4 pounds BMI:     27.07 Temp:     97.9 degrees F oral Pulse rate:   56 / minute Resp:     15 per minute BP sitting:   106 / 70  (left arm) Cuff size:   large  Vitals Entered By: Shonna Chock CMA (January 15, 2010 5:05 PM) CC: Chest pain: patient states " I caused pain"  patient ? pulled muscle or cracked rib on left side, patient was reaching for something and thats when pain onset (about 7 hours ago)    Primary Care Provider:  Marga Melnick, MD  CC:  Chest pain: patient states " I caused pain"  patient ? pulled muscle or cracked rib on left side and patient was reaching for something and thats when pain onset (about 7 hours ago).  History of Present Illness:       This is a 70 year old male who presents with acute chest pain after hyperextension of trunk & LUE over back of chair retrieving glasses. He felt a " rolling " sensation @ LSB . The patient reports positional  chest pain, but denies nausea, vomiting, diaphoresis, shortness of breath, palpitations, dizziness, light headedness, syncope, and indigestion.  The pain is described as intermittent and sharp.  The pain is located in the left anterior chest.  The pain radiates to the back.  Episodes of chest pain last < 1 minute.  The pain is brought on or made worse by deep breathing , laughing and upper body movement.  The pain is relieved or improved with rest; Tylenol did not help.    Current Medications (verified): 1)  Xalatan   Soln (Latanoprost Soln) 2)  Prilosec 20 Mg  Cpdr (Omeprazole) .... Take 1 Tablet By Mouth Two Times A Day 3)  Lorazepam 1 Mg  Tabs (Lorazepam) .Marland Kitchen.. 1 By Mouth Once Daily Prn 4)  Cialis 20 Mg  Tabs (Tadalafil) .... Every Other Day 5)  Folic Acid 1000mg  6)  Multivitamin Without Iron 7)  Flaxseed Oil 2000mg  .... 2 Tab Qd 8)  Crestor 20 Mg Tabs (Rosuvastatin Calcium) .Marland Kitchen.. 1 By  Mouth At Bedtime  Allergies: 1)  ! Asa 2)  ! Relafen 3)  ! * Heavy Pain Meds 4)  ! Codeine  Physical Exam  General:  appears younger than age;well-nourished,in no acute distress; alert,appropriate and cooperative throughout examination Chest Wall:  costochondrial tenderness L ant chest.   Lungs:  Normal respiratory effort, chest expands symmetrically. Lungs are clear to auscultation, no crackles or wheezes. Heart:  regular rhythm, no murmur, no gallop, no rub, no JVD, no HJR, and bradycardia.   S4 with slurring Abdomen:  Bowel sounds positive,abdomen soft and non-tender without masses, organomegaly or hernias noted. Pulses:  R and L carotid,radial,dorsalis pedis and posterior tibial pulses are full and equal bilaterally Extremities:  No clubbing, cyanosis, edema. Homan's  Neurologic:  alert & oriented X3.   Skin:  Intact without suspicious lesions or rashes Cervical Nodes:  No lymphadenopathy noted Axillary Nodes:  No palpable lymphadenopathy Psych:  memory intact for recent and remote, normally interactive, and good eye contact.     Impression & Recommendations:  Problem # 1:  COSTOCHONDRITIS (ICD-733.6)  Secondary to hyperextension  Orders: EKG w/ Interpretation (93000) Prescription Created Electronically [phone])  Complete Medication List: 1)  Xalatan Soln (Latanoprost soln) 2)  Prilosec 20 Mg Cpdr (Omeprazole) .... Take 1 tablet by mouth two times a day 3)  Lorazepam 1 Mg Tabs (Lorazepam) .Marland Kitchen.. 1 by mouth once daily prn 4)  Cialis 20 Mg Tabs (Tadalafil) .... Every other day 5)  Folic Acid 1000mg   6)  Multivitamin Without Iron  7)  Flaxseed Oil 2000mg   .... 2 tab qd 8)  Crestor 20 Mg Tabs (Rosuvastatin calcium) .Marland Kitchen.. 1 by mouth at bedtime 9)  Tramadol Hcl 50 Mg Tabs (Tramadol hcl) .Marland Kitchen.. 1 every 6 hrs as needed pain  Patient Instructions: 1)  Warm moist compresses or Zostrix cream. Glucosamine sulfate 1500 mg once daily until pain resolves. Prescriptions: TRAMADOL HCL 50  MG TABS (TRAMADOL HCL) 1 every 6 hrs as needed pain  #30 x 1   Entered and Authorized by:   Marga Melnick MD   Signed by:   Marga Melnick MD on 01/15/2010   Method used:   Faxed to ...       [facility]* (retail)       [address] [place], [place]  [address]       Ph: [phone] or [phone]       Fax: [phone]   RxID:   [id number]

## 2010-07-12 NOTE — Assessment & Plan Note (Signed)
Summary: sore on bottom of foot/kdc   Vital Signs:  Patient profile:   70 year old male Weight:      168.6 pounds Temp:     98.3 degrees F oral Pulse rate:   64 / minute Resp:     14 per minute BP sitting:   110 / 64  (left arm) Cuff size:   large  Vitals Entered By: Shonna Chock (August 29, 2009 10:45 AM) CC: Sore on the bottom of left foot Comments REVIEWED MED LIST, PATIENT AGREED DOSE AND INSTRUCTION CORRECT    Primary Care Provider:  Marga Melnick, MD  CC:  Sore on the bottom of left foot.  History of Present Illness: Intermittent soreness L  lateral sole @base  of 4&5th toes  since 04/2009 after walking on  uneven ground over 3 days . Constant as of 1 month ago after starting to walk again; it resolves overnight but recurs as soon as he ambulates in am. It also flares with clutch use. Rx: none. No PMH of gout ; PMH DJD.  Allergies: 1)  ! Asa 2)  ! Relafen 3)  ! * Heavy Pain Meds 4)  ! Codeine  Physical Exam  General:  in no acute distress; alert,appropriate and cooperative throughout examination Extremities:  No clubbing, cyanosis, edema, or significant  deformity noted with normal full range of motion of all joints.  Minor OA DIP R index finger. Tender to palpation @ base 4&5th L toes, ? osteoma Skin:  Intact without suspicious lesions or rashes   Impression & Recommendations:  Problem # 1:  FOOT PAIN, LEFT (ICD-729.5)  R/O fracture vs. Neuroma  Orders: T-Foot Left Min 3 Views (73630TC) Orthopedic Referral (Ortho)  Complete Medication List: 1)  Xalatan Soln (Latanoprost soln) 2)  Prilosec 20 Mg Cpdr (Omeprazole) .... Take 1 tablet by mouth two times a day 3)  Lorazepam 1 Mg Tabs (Lorazepam) .Marland Kitchen.. 1 by mouth once daily prn 4)  Cialis 20 Mg Tabs (Tadalafil) .... Every other day 5)  Folic Acid 1000mg   6)  Multivitamin Without Iron  7)  Flaxseed Oil 2000mg   .... 2 tab qd 8)  Crestor 20 Mg Tabs (Rosuvastatin calcium) .Marland Kitchen.. 1 by mouth at bedtime  Patient  Instructions: 1)  Wear most rigid shoe available

## 2010-07-12 NOTE — Progress Notes (Signed)
Summary: Radiology Results  Phone Note Outgoing Call Call back at Home Phone [phone]   Call placed by: Shonna Chock CMA,  March 20, 2010 10:12 AM Call placed to: Patient Details for Reason: Radiology Results-Ribs Unilateral Summary of Call: Spoke with patient and his wife reguarding info below:  Fractured 8th R rib suggested with no displacement. It will heal over several weeks if protected. Alfonse FlavorsShonna Chock CMA  March 20, 2010 10:13 AM

## 2010-07-12 NOTE — Assessment & Plan Note (Signed)
Summary: NOT FEELING ANY BETTER/BACK HURTS/KDC   Vital Signs:  Patient profile:   70 year old male Weight:      167.4 pounds Temp:     97.8 degrees F oral Pulse rate:   72 / minute Resp:     16 per minute BP sitting:   130 / 72  (left arm) Cuff size:   large  Vitals Entered By: Shonna Chock (July 24, 2009 4:34 PM) CC: Seen Dr.Lowne Friday Rx'ed Avelox x 10days. Patient not better: Fever still off/on, Upper back and chest pain Comments REVIEWED MED LIST, PATIENT AGREED DOSE AND INSTRUCTION CORRECT    Primary Care Provider:  Marga Melnick, MD  CC:  Seen Dr.Lowne Friday Rx'ed Avelox x 10days. Patient not better: Fever still off/on and Upper back and chest pain.  History of Present Illness: Intermittent fever to 101F  since 02/11 appt( note reviewed). No fever today. Major issue is sensitivity to touch over inferior thorax. L chest pain  @ night  "like reflux pain" which is worse in LLDP, better in RLDP. He has been taking 2 ibuprofen up to 6-8/ day. He switched to Tylenol yesterday. Severe paroxysmal NP cough.CXray reviewed : bronchitis w/o PNA. He signed for codeine ; he is tolerating it despite "codeine allergy". He had mental status changes post op with high doses of codeine, no fever or rash. No PMH pof asthma.  Allergies: 1)  ! Asa 2)  ! Relafen 3)  ! * Heavy Pain Meds 4)  ! Codeine  Review of Systems General:  Complains of sweats; denies chills. ENT:  Complains of earache; denies difficulty swallowing, ear discharge, hoarseness, nasal congestion, and sinus pressure; No facial pain , frontal headache, or purulence. Resp:  Denies chest pain with inspiration, coughing up blood, shortness of breath, and wheezing. GI:  Denies bloody stools, dark tarry stools, and indigestion.  Physical Exam  General:  well-nourished,alert,appropriate and cooperative throughout examination Eyes:  No corneal or conjunctival inflammation noted.  Perrla.No icterus Mouth:  Oral mucosa and  oropharynx without lesions or exudates.  Teeth in good repair. Minor erythema of uvula Lungs:  Normal respiratory effort, chest expands symmetrically. Lungs are clear to auscultation, no crackles or wheezes. Heart:  regular rhythm and bradycardia.  S4 with slurring Abdomen:  Bowel sounds positive,abdomen soft minimally RUQ tenderness  without masses, organomegaly or hernias noted. Skin:  Intact without suspicious lesions or rashes; no jaundice Cervical Nodes:  No lymphadenopathy noted Axillary Nodes:  No palpable lymphadenopathy Psych:  memory intact for recent and remote, normally interactive, and good eye contact.     Impression & Recommendations:  Problem # 1:  ACUTE BRONCHITIS (ICD-466.0)  His updated medication list for this problem includes:    Benzonatate 200 Mg Caps (Benzonatate) .Marland Kitchen... 1  q 8 hrs as needed cough    Avelox 400 Mg Tabs (Moxifloxacin hcl) .Marland Kitchen... 1 by mouth once daily    Cheratussin Ac 100-10 Mg/7ml Syrp (Guaifenesin-codeine) .Marland Kitchen... 1-2 tsp by mouth at bedtime    Hydrocod Polst-chlorphen Polst 10-8 Mg/62ml Lqcr (Chlorpheniramine-hydrocodone) .Marland Kitchen... 1 tsp q 8-12 hrs as needed for cough  Problem # 2:  FEVER (ICD-780.60)  Problem # 3:  CHEST PAIN (ICD-786.50)  Due to NSAIDS effect on ERD  Orders: EKG w/ Interpretation (93000)  Complete Medication List: 1)  Xalatan Soln (Latanoprost soln) 2)  Prilosec 20 Mg Cpdr (Omeprazole) .... Take 1 tablet by mouth two times a day 3)  Lorazepam 1 Mg Tabs (Lorazepam) .Marland Kitchen.. 1 by mouth once  daily prn 4)  Cialis 20 Mg Tabs (Tadalafil) .... Every other day 5)  Folic Acid 1000mg   6)  Tramadol Hcl 50 Mg Tabs (Tramadol hcl) .Marland Kitchen.. 1 by mouth once daily as needed 7)  Multivitamin Without Iron  8)  Flaxseed Oil 2000mg   .... 2 tab qd 9)  Polyethylene Glycol 3350 Powd (Polyethylene glycol 3350) .... Dissolve 17 gms (1 capful) in 8 oz of liquid and take daily.  disp 1 large bottle 10)  Crestor 20 Mg Tabs (Rosuvastatin calcium) .Marland Kitchen.. 1 by mouth  at bedtime 11)  Benzonatate 200 Mg Caps (Benzonatate) .Marland Kitchen.. 1  q 8 hrs as needed cough 12)  Avelox 400 Mg Tabs (Moxifloxacin hcl) .Marland Kitchen.. 1 by mouth once daily 13)  Cheratussin Ac 100-10 Mg/78ml Syrp (Guaifenesin-codeine) .Marland Kitchen.. 1-2 tsp by mouth at bedtime 14)  Hydrocod Polst-chlorphen Polst 10-8 Mg/2ml Lqcr (Chlorpheniramine-hydrocodone) .Marland Kitchen.. 1 tsp q 8-12 hrs as needed for cough  Patient Instructions: 1)  Omeprazole 20 mg two times a day pre meals.NO NSAIDS ! 2)  Avoid foods high in acid (tomatoes, citrus juices, spicy foods). Avoid eating within two hours of lying down or before exercising. Do not over eat; try smaller more frequent meals. Elevate head of bed twelve inches when sleeping. Prescriptions: HYDROCOD POLST-CHLORPHEN POLST 10-8 MG/5ML LQCR (CHLORPHENIRAMINE-HYDROCODONE) 1 tsp q 8-12 hrs as needed for cough  #90cc x 0   Entered and Authorized by:   Marga Melnick MD   Signed by:   Marga Melnick MD on 07/24/2009   Method used:   Print then Give to Patient   RxID:   [id number]

## 2010-08-31 ENCOUNTER — Encounter: Payer: Self-pay | Admitting: Internal Medicine

## 2010-09-03 ENCOUNTER — Encounter: Payer: Self-pay | Admitting: Internal Medicine

## 2010-09-03 ENCOUNTER — Ambulatory Visit (INDEPENDENT_AMBULATORY_CARE_PROVIDER_SITE_OTHER): Payer: Medicare Other | Admitting: Internal Medicine

## 2010-09-03 VITALS — BP 122/70 | HR 64 | Temp 98.0°F | Wt 167.6 lb

## 2010-09-03 DIAGNOSIS — R7611 Nonspecific reaction to tuberculin skin test without active tuberculosis: Secondary | ICD-10-CM

## 2010-09-03 DIAGNOSIS — K219 Gastro-esophageal reflux disease without esophagitis: Secondary | ICD-10-CM

## 2010-09-03 DIAGNOSIS — M79609 Pain in unspecified limb: Secondary | ICD-10-CM

## 2010-09-03 DIAGNOSIS — M79662 Pain in left lower leg: Secondary | ICD-10-CM

## 2010-09-03 DIAGNOSIS — M79661 Pain in right lower leg: Secondary | ICD-10-CM

## 2010-09-03 LAB — CK: Total CK: 78 U/L (ref 7–232)

## 2010-09-03 NOTE — Patient Instructions (Signed)
Please do isometric exercises we discussed prior to going to bed. If you're having calf discomfort please take Cal/Mag  and as needed

## 2010-09-03 NOTE — Progress Notes (Signed)
  Subjective:    Patient ID: Edwin Ramirez, male    DOB: [birth year], 70 y.o.   MRN: [id number]  HPI  Over the last month he had had recurrent sore throat with productive cough. 2 weeks ago he started taking his PPI twice a day. The symptoms have resolved ; he has decreased the PPI to once a day as of last week.   He specifically  denies  pain in the ears or discharge from the ears; frontal headache; facial pain; dental pain; wheezing or shortness of breath ; or productive cough. He's had intermittent TMJ symptoms on the right.   He's also concerned about calf pain please had for several months. This is described as  dull aching  pain in the calves intermittently at night ; it does improve if he gets up and ambulates. He denies symptoms of restless leg syndrome. These symptoms were in the context of returning to ice skating and walking up to 3 miles per day.  He is concerned that these symptoms might be related to Crestor. As noted the symptoms are intermittent not daily.     he has cataract surgery scheduled in near future. He was asked if he ever had tuberculosis but at the surgical office. 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 His daughter was treated with isoniazid at that time. His most recent chest x-ray was October 2011 and revealed no evidence of tuberculosis. Her he denies any constitutional symptoms at this time with fever chills sweats or weight loss. As noted above he does not have a productive cough and has not had hemoptysis.       Review of Systems     Objective:   Physical Exam  Constitutional: He is oriented to person, place, and time. He appears well-nourished.  HENT:  Right Ear: External ear normal.  Left Ear: External ear normal.  Nose: Nose normal.  Mouth/Throat: Oropharynx is clear and moist. No oropharyngeal exudate.  Eyes: Conjunctivae are normal. Right eye exhibits no discharge. Left eye exhibits no discharge. No scleral icterus.  Pulmonary/Chest: Effort normal and  breath sounds normal. He has no wheezes. He has no rales.  Musculoskeletal: Normal range of motion. He exhibits no edema and no tenderness.        There is normal range of motion of lower extremities. Homans sign is negative bilaterally.  Lymphadenopathy:    He has no cervical adenopathy.  Neurological: He is oriented to person, place, and time. He has normal reflexes. He exhibits normal muscle tone.  Skin: Skin is warm and dry. No rash noted.             Assessment & Plan:   #1 he's had pseudo-upper respiratory tract symptoms related to significant reflux. These symptoms have resolved increasing his PPI to twice a day.   #2 calf pain; this is due to an overuse syndrome related to ice skating and walking up to 3 miles a day. Isometric exercises prior to going to bed are recommend routine basis ; a combination of calcium and magnesium (Cal/Mag) would be recommended as needed.   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

## 2010-10-10 ENCOUNTER — Telehealth: Payer: Self-pay | Admitting: Internal Medicine

## 2010-10-10 MED ORDER — OMEPRAZOLE 20 MG PO CPDR: 20.0000 mg | DELAYED_RELEASE_CAPSULE | Freq: Two times a day (BID) | ORAL | Status: DC

## 2010-10-10 NOTE — Telephone Encounter (Signed)
Spoke with patient and scheduled him to see Dr. [name] on 11/01/10 at 9:30 AM

## 2010-10-10 NOTE — Telephone Encounter (Signed)
Patient given one month supply of Prilosec until his appointment. Patient understands it will not be refilled if he does not keep appointment

## 2010-10-23 NOTE — Assessment & Plan Note (Signed)
[name]                        [name] NOTE   NAME:Edwin Ramirez, Edwin Ramirez                 MRN:          [id number]  DATE:10/06/2006                            DOB:          1940/11/18    PAST MEDICAL HISTORY:  Unchanged from that recorded on August 18, 2006.  Additions to that dictation include esophageal dilatation x1 and  appendectomy remotely.   His mother had colon cancer, father had multiple myeloma and maternal  uncle had coronary artery disease.   He presently exercises two times per week, no cardiopulmonary symptoms,  walking 2-1/2 to 3 miles.  He has liberalized his diet and has greater  fat intake than previously.  He states that with these changes he has  had less of the abdominal pain for which he was seen by Hedwig Morton. [name],  M.D. (see her dictation of October 04, 2006).   He is no longer on Celebrex.  He is on Xalatan eye drops for glaucoma,  folic acid 1000 mcg daily, Omega-3 fatty acid, multivitamin without  iron, omeprazole 20 mg daily, Vytorin 10/20, and Glucosamine supplement.   ALLERGIES:  He is intolerant to RELAFEN and ASPIRIN which cause GI  symptoms.   REVIEW OF SYSTEMS:  Positive for constant irritation in the deltoid  and wrist and also in the right scapular area.   He is scheduled to see a neurosurgeon in [facility] in June.  He also  has been evaluated by Caralyn Guile. Ethelene Hal, M.D. of [facility].  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  He will be seeing Alvy Beal, M.D. for further  evaluation.   Contact with the neurosurgery office indicates that he will be seen by a  P.A.; the possibility of epidural steroid has been raised.  He is  concerned because prior administration of steroids has caused  exacerbation of his glaucoma.   The remainder of the review of systems is essentially negative.  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   PHYSICAL EXAMINATION:  VITAL SIGNS:  Weight is essentially stable at  165, pulse 60, respiratory rate 16, and blood pressure 110/66.  GENERAL:  He appears healthy and in no acute distress.  HEENT:  Fundal examination reveals some arteriolar narrowing but no  significant vascular disease.  Otolaryngologic examination is  unremarkable.  Dental hygiene is excellent.  NECK:  Thyroid is normal to palpation.  CHEST:  Clear; an S4 is noted.  All pulses are intact and there are no  bruits.  ABDOMEN:  No organomegaly or masses.  GENITOURINARY:  Deferred to his urologist, [name], M.D.  He has no  lymphadenopathy or organomegaly.  EXTREMITIES:  He has crepitus in the knees, but no other significant  musculoskeletal findings.  Strength and deep tendon reflexes are normal.  Specifically no deficit was noted in the right upper extremity to direct  opposition.  NEUROLOGIC/  PSYCHIATRIC:  Negative.   Stool cards were normal x6.  EKG revealed sinus bradycardia.   LABORATORY DATA:  Fasting  labs will be collected with a copy sent to him  for his home file.  Additionally a copy of this note will be requested  to Dr. Ethelene Hal and also will be shared with the neurosurgeon if that  evaluation is completed.     [name]. [name], MD,FACP,FCCP  Electronically Signed    [facility]/MedQ  DD: 10/06/2006  DT: 10/06/2006  Job #: [id number]   cc:   Caralyn Guile. Ethelene Hal, M.D.

## 2010-10-26 NOTE — Discharge Summary (Signed)
NAMEMARON, STANZIONE          ACCOUNT NO.:  [id number]   MEDICAL RECORD NO.:  [id number]          PATIENT TYPE:  INP   LOCATION:  1408                         FACILITY:  [facility]   PHYSICIAN:  Heloise Purpura, MD      DATE OF BIRTH:  Mar 22, 1941   DATE OF ADMISSION:  08/01/2005  DATE OF DISCHARGE:  08/02/2005                                 DISCHARGE SUMMARY   ADMISSION DIAGNOSIS:  Clinically localized adenocarcinoma of the prostate.   DISCHARGE DIAGNOSIS:  Clinically localized adenocarcinoma of the prostate.   PROCEDURES:  Robotic assisted laparoscopic radical prostatectomy.   HISTORY:  For full details, please see admission history and physical.  Briefly, Edwin Ramirez is a 70 year old gentleman with recently diagnosed  clinically localized adenocarcinoma of the prostate. After discussion  regarding management options, the patient elected to proceed with surgical  therapy.   HOSPITAL COURSE:  Following an uneventful robotic assisted laparoscopic  radical prostatectomy, the patient was able to be admitted to a regular  hospital room following recovery from anesthesia. Over the course of the  next 24 hours, he was able to begin ambulating which he did without  difficulty. He was also able to begin a clear liquid diet on postoperative  day #1 which he tolerated without difficulty. He was then able to be  transitioned over to oral pain medication. Over the course of the 24 hours  following surgery, his pelvic drain had minimal output and it was able to be  removed on postoperative day #1. He was thus able to be discharged home on  postoperative day #1 in excellent condition.   DISPOSITION:  Home.   DISCHARGE MEDICATIONS:  Edwin Ramirez was instructed to resume his regular  home medications except for any aspirin, nonsteroidal anti-inflammatory  drugs, or herbal medications for a period of 10 days. He was given a  prescription for Vicodin to take as needed for pain. He was also  given a  prescription for Cipro to begin 1 day prior to his return visit. He was also  told to take Colace as a stool softener.   DISCHARGE INSTRUCTIONS:  The patient was instructed to gradually advance his  diet as tolerated once passing flatus. He was instructed to be ambulatory  but specifically instructed to refrain from any heavy lifting, strenuous  activity, or driving. He was instructed on the signs and symptoms of wound  infection and told to call should he have any problems. He was also  instructed on routine Foley catheter care and given a leg bag for daytime  usage.   PATHOLOGY:  Edwin Ramirez pathology returned on the evening of  February23, 2007.  This demonstrated a pT2c NX MX adenocarcinoma of the  prostate, Gleason 3+3=6 with negative surgical margins. These results were  discussed with the patient via telephone on the evening of August 02, 2005.   FOLLOW UP:  Edwin Ramirez will follow up 1 week from his discharge for  staple removal as well as Foley catheter removal.           ______________________________  Heloise Purpura, MD  Electronically Signed  LB/MEDQ  D:  08/03/2005  T:  08/05/2005  Job:  [id number]   cc:   [name]. [name], M.D. [facility]  [phone]  [place] [address]

## 2010-10-26 NOTE — H&P (Signed)
Edwin Ramirez, Edwin Ramirez          ACCOUNT NO.:  [id number]   MEDICAL RECORD NO.:  [id number]          PATIENT TYPE:  INP   LOCATION:  NA                           FACILITY:  [facility]   PHYSICIAN:  Heloise Purpura, MD      DATE OF BIRTH:  12/27/1940   DATE OF ADMISSION:  08/01/2005  DATE OF DISCHARGE:                                HISTORY & PHYSICAL   CHIEF COMPLAINT:  Clinically localized prostate cancer.   HISTORY OF PRESENT ILLNESS:  Edwin Ramirez is a 70 year old gentleman who  was recently diagnosed with clinical stage T1C prostate cancer with a PSA of  2.4 and a Gleason Score of 3+3=6 in 10% of the left sided biopsy specimens.  Preoperative IPSS score was 1 with an IIEF score of 21.  After discussing  management options for clinically localized adenocarcinoma of the prostate,  it was decided to proceed with surgical therapy.   PAST MEDICAL HISTORY:  1.  Gastroesophageal reflux disease.  2.  Glaucoma.  3.  Ventral hernia.  4.  Hypercholesterolemia.  5.  History of basal cell carcinoma.  6.  Rosacea.   PAST SURGICAL HISTORY:  1.  Left rotator cuff surgery.  2.  Laparoscopic appendectomy.  3.  Laparoscopic cholecystectomy.   MEDICATIONS:  1.  Lovastatin.  2.  Prilosec.  3.  Xalatan.  4.  Flax seed oil.  5.  Folic acid.  6.  Multivitamin.   ALLERGIES:  CODEINE which causes the patient to become spacey.  He also has  a contact allergy to ADHESIVE TAPE.   FAMILY HISTORY:  There is no history of genitourinary malignancy.   SOCIAL HISTORY:  The patient is currently retired.  He is married. He denies  tobacco use and drinks alcohol occasionally.   PHYSICAL EXAMINATION:  CONSTITUTIONAL:  The patient is a well-nourished,  well-developed age-appropriate male.  CARDIOVASCULAR:  Regular rate and rhythm.  LUNGS:  Clear bilaterally.  ABDOMEN: Soft, nontender without abdominal masses.  He does have well healed  laparoscopic port incisions.  GENITOURINARY:  No nodularity or  induration on digital rectal examination.   IMPRESSION:  Clinically localized prostate cancer.   PLAN:  [name] will undergo a robotic-assisted laparoscopic radical  prostatectomy and then be admitted to the hospital for routine postoperative  care.           ______________________________  Heloise Purpura, MD  Electronically Signed     LB/MEDQ  D:  08/01/2005  T:  08/01/2005  Job:  [id number]

## 2010-10-26 NOTE — Op Note (Signed)
[facility]  Patient:    Edwin Ramirez, Edwin Ramirez                 MRN: [id number] Proc. Date: 10/30/00 Adm. Date:  [id number] Disc. Date: [id number] Attending:  Meredith Leeds                           Operative Report  PREOPERATIVE DIAGNOSIS:  Acute appendicitis.  POSTOPERATIVE DIAGNOSIS:  Acute appendicitis.  OPERATION PERFORMED:  Laparoscopic appendectomy.  SURGEON:  Dr. Orson Slick.  ANESTHESIA:  General.  DESCRIPTION OF PROCEDURE:  After the patient was adequately monitored and anesthetized and after routine preparation and draping of the abdomen and with a Foley catheter in place, I made a short infraumbilical incision, dissected down to the fascia, opened it longitudinally and opened the peritoneum bluntly. I placed a pursestring suture #0 Vicryl suture in the fascia, secured a Hasson cannula and inflated the abdomen with CO2. Laparoscopy disclosed an acutely inflamed appendix and stuck to the anterior abdominal wall and no other abnormalities noted. I put in a 5 mm right mid abdominal port and a 12 mm suprapubic port and freed up the appendix. The base of the appendix appeared normal. I used endovascular stapler and transected the mesoappendix and the appendix with the stapler. It didnt quite go all the way across and I used a second firing of the stapler and there was a nice clean resection. There was a little bit of bleeding from the mesentery and I used the suction and cautery and controlled that without difficulty. The cecum appeared healthy. I irrigated the area and removed the irrigant. There was no bleeding from the port sites. I removed the appendix through the umbilical incision in a plastic bag and then tied the pursestring suture and removed the lateral port under direct vision and removed the suprapubic port after releasing the CO2. I closed the skin incisions with intracuticular 4-0 Vicryl and Steri-Strips and applied bandages. The  patient tolerated the operation well. D:  10/30/00 TD:  10/30/00 Job: [id number]

## 2010-10-26 NOTE — Assessment & Plan Note (Signed)
[facility]                        [name] NOTE   NAME:Edwin Ramirez, Edwin Ramirez                 MRN:          [id number]  DATE:08/18/2006                            DOB:          1940-12-29    [name] was seen August 18, 2006.  Age 70.  Complaining of  neck & shoulder pain intermittently since July or August of 2007.  He  originally attributed the symptoms to alterations in sleeping pattern,  specifically sleeping in a new bed in his home.  He is having pain in  the neck, a numbness in the right thenar eminence at times, with some  pain across the shoulder, worse with extension of left upper extremity.  At that time he was treated with glucosamine and advised to get a  cervical pillow.  He was given Celebrex for five days and also used full  strength Tylenol qhs.He returned February 25, 2006 stating that his  right shoulder pain was getting worse.  Celebrex had been of no benefit.  Full strength Tylenol at bedtime had been of greater benefit.  He was  having some burning discomfort in the right scapular area and sharp pain  in the scapular area when  the right upper extremity was opposed when it  was adducted medially.   The exam revealed no definite neurologic findings, but had  some pain  with shoulder rotation.   On July 14, 2006, he presented with pain in the left neck upon  awakening, aggravated by swallowing & discomfort with lateral rotation  of the head.  The pain with swallowing was referred to the left ear.  Tylenol resulted in 90% improvement.  There was no evidence of  temporomandibular joint dysfunction.  Isometric exercises were  recommended for the cervical and trapezius muscle pain.   On March 10 he described paresthesias at the base of the right thumb  with burning in the anterior axillary area for one week. He would have  pain with elevation of the arm such as combing his hair.  He also noted  aching  in the posterior cervical spine area looking up @ a scoreboard  during the [facility] [employer].   He denies any constitutional symptoms, has had no rash.   He has had rotator cuff surgery on two occasions on the left in 1998 and  in 2000.   He has esophageal reflux, which has been symptomatic & for which he is  on omeprazole 20 mg daily.   In 2006 he was treated for adenocarcinoma of the prostate with a  prostatectomy.   He has also had a cholecystectomy.  He has dyslipidemia and is on  Vytorin 10/20.  He is also on Xalatan eye drops for glaucoma.   PHYSICAL EXAMINATION:  VITAL SIGNS:  Weight is stable and vital signs  are normal.  LYMPHATICS:  He has no lymphadenopathy.  MUSCULOSKELETAL:  Unremarkable.  There was some limitation of  range of  motion of the neck.  Cranial nerve exam is normal.  He describes pain  with elevation of the arm passively but there was no pain with rotation  of the  shoulder, no pain to opposition, abduction or elevation.  Strength was excellent in the hands and reflexes were normal.   MRI of the cervical spine will be performed.  If this is negative, then  referral would be made to an orthopedist to evaluate the shoulder.     [name]. [name], MD,FACP,FCCP  Electronically Signed    [facility]/MedQ  DD: 08/18/2006  DT: 08/18/2006  Job #: [id number]

## 2010-10-26 NOTE — Op Note (Signed)
NAMEAIMAN, Edwin Ramirez          ACCOUNT NO.:  [id number]   MEDICAL RECORD NO.:  [id number]          PATIENT TYPE:  INP   LOCATION:  NA                           FACILITY:  [facility]   PHYSICIAN:  Heloise Purpura, MD      DATE OF BIRTH:  1940-08-10   DATE OF PROCEDURE:  08/01/2005  DATE OF DISCHARGE:                                 OPERATIVE REPORT   PREOPERATIVE DIAGNOSIS:  Clinically localized adenocarcinoma of prostate.   POSTOPERATIVE DIAGNOSIS:  Clinically localized adenocarcinoma of prostate.   PROCEDURE:  Robotic assisted laparoscopic radical prostatectomy (bilateral  nerve sparing).   SURGEON:  [name], M.D.   ASSISTANT:  Dr. Bjorn Pippin.   ANESTHESIA:  General.   COMPLICATIONS:  None.   ESTIMATED BLOOD LOSS:  300 mL.   INTRAVENOUS FLUIDS:  1800 mL of lactated Ringer's.   SPECIMEN:  Prostate and seminal vesicles.   DRAINS:  1.  A #19 Blake pelvic drain.  2.  An 18-French Coude catheter.   INDICATION:  Edwin Ramirez is a 70 year old gentleman with clinical stage  T1C prostate cancer with a PSA of 2.4 and a Gleason score of 3+3 equals 6.  His preoperative IPSS score was 1 with an IIEF score of 21. After discussing  management options, the patient elected to proceed with the above procedure  for prostate cancer therapy. Potential risks and benefits of this procedure  were discussed with the patient and he consented.   DESCRIPTION OF PROCEDURE:  The patient was taken to the operating room and a  general anesthetic was administered. He was given preoperative antibiotics,  placed in the dorsal lithotomy position, prepped and draped in the usual  sterile fashion. Next, a preoperative time-out was performed. A Foley  catheter was then inserted into the bladder. A site was selected 18 cm from  the pubic symphysis and just to the left of the umbilicus for placement of  the camera port. This was placed using a standard Hasson technique which  allowed entry into the  peritoneal cavity under direct vision.  A 12 mm port  was then placed and a pneumoperitoneum was established. With the 0 degree  lens, the abdomen was inspected and there was no evidence of any intra-  abdominal injuries. There were noted to be some adhesions between the  ascending colon and the right lower quadrant likely a result of the  patient's prior laparoscopic appendectomy. The remaining ports were then  placed. Bilateral 8 mm robotic ports were placed 16 cm from the pubic  symphysis and 10 cm lateral to the camera port. An additional 8 mm robotic  port was placed in the far left lateral abdominal wall. A 5 mm port was  placed between the camera port and the right robotic port. An additional 12  mm laparoscopic port and was placed in the far right lateral abdominal wall  after the previously mentioned adhesions were taken down with the  laparoscopic scissors. All ports were placed under direct vision and without  difficulty. The surgical cart was then docked. With the aid of the hook  cautery, the bladder was reflected posteriorly.  During this dissection,  there was noted to be a small cystotomy made. This was immediately  identified and was closed in two layers with 2-0 Vicryl suture. The bladder  was then filled and there was no evidence of leakage. Dissection then  continued exposing the space of Retzius and allowing identification of the  prostate and endopelvic fascia. The endopelvic fascia was then incised from  the apex back to the base of the prostate bilaterally and the underlying  levator muscle fibers were swept laterally off the prostate. This isolated  the dorsal venous complex which was stapled and divided with the 45 mm flex  ETS stapler. The bladder neck was then identified with the aid of  Foley  catheter manipulation and opened anteriorly. This allowed identification of  the catheter. The Foley catheter balloon was then deflated allowing the  catheter to be brought  into the operative field and used to retract the  prostate anteriorly. Dissection continued posteriorly allowing the posterior  bladder neck to be divided and the vasa differentia and seminal vesicles to  be identified. The vasa differentia were isolated and subsequently divided.  The seminal vesicles were isolated and reflected anteriorly. The space  between Denonvilliers' fascia and the anterior rectum was then bluntly  developed thereby isolating the vascular pedicles of the prostate. Attention  then turned to the anterior aspect of the prostate. Using sharp dissection,  the lateral prostatic fascia was incised along the prostate bilaterally  allowing the neurovascular bundles to be swept laterally and posteriorly off  the prostate. Hemolok clips were then used to ligate the vascular pedicles  which were sharply divided. Care was taken to provide neurovascular  preservation at the base of the prostate bilaterally. The urethra was then  sharply divided allowing the prostate to be disarticulated and placed up  placed up into the abdomen. The pelvis was then copiously irrigated and with  irrigation in the pelvis, air was injected into the rectal catheter which  demonstrated no evidence of a rectal injury. Hemostasis appeared adequate  and therefore attention turned to the urethral anastomosis. A 2-0 Vicryl  slip-knot was placed at the 6 o'clock position between the bladder neck and  urethra and used to reapproximate these structures.  A double-armed 2-0  Monocryl suture was then used for a 360 degree running anastomosis between  the bladder neck and urethra in a tension-free fashion. A new 18-French  Coude catheter was then inserted into the bladder with difficulty. It was  irrigated. There was no evidence of blood clots within the bladder or  leakage from the anastomosis. A #19 Blake drain was then brought through the left robotic port and appropriately positioned in the pelvis. It was  secured  to the skin with a nylon suture. All remaining ports were then removed under  direct vision.  The surgical cart was then undocked. The Endopouch retrieval  bag was used to retrieve the prostate via the periumbilical camera port  site. A 0 Vicryl suture was then placed through the abdominal wall fascia of  the right lateral 12 mm port site for port site closure. All remaining ports  were then removed under direct vision. The periumbilical port was then  slightly extended inferiorly allowing the prostate specimen to be removed  intact within the Endopouch retrieval bag. This fascial opening was then  closed with a running 0 Vicryl suture. All ports were then injected with  0.25% Marcaine and reapproximated at the skin level with staples. Sterile  dressings were applied. The patient appeared to tolerate the procedure well  and without complications. He was able to be extubated and transferred to  the recovery unit in satisfactory condition. All sponge and needle counts  were correct x2 at the end of the procedure.           ______________________________  Heloise Purpura, MD  Electronically Signed     LB/MEDQ  D:  08/01/2005  T:  08/02/2005  Job:  [id number]

## 2010-11-01 ENCOUNTER — Encounter: Payer: Self-pay | Admitting: Internal Medicine

## 2010-11-01 ENCOUNTER — Ambulatory Visit (INDEPENDENT_AMBULATORY_CARE_PROVIDER_SITE_OTHER): Payer: Medicare Other | Admitting: Internal Medicine

## 2010-11-01 VITALS — BP 112/68 | HR 68 | Ht 67.0 in | Wt 165.0 lb

## 2010-11-01 DIAGNOSIS — K219 Gastro-esophageal reflux disease without esophagitis: Secondary | ICD-10-CM

## 2010-11-01 DIAGNOSIS — R932 Abnormal findings on diagnostic imaging of liver and biliary tract: Secondary | ICD-10-CM

## 2010-11-01 MED ORDER — OMEPRAZOLE 20 MG PO CPDR: 40.0000 mg | DELAYED_RELEASE_CAPSULE | Freq: Two times a day (BID) | ORAL | Status: DC

## 2010-11-01 NOTE — Progress Notes (Signed)
JUQUAN REZNICK 10/02/1940 MRN [id number]     History of Present Illness:  This is a 70 year old white male with gastroesophageal reflux and history of esophageal stricture. This is a followup appointment. He denies any dysphagia or odynophagia. Prior upper endoscopies in 4132,4401 and in April 2009 showed mild a benign esophageal stricture which was dilated. He has been on Prilosec 20 mg twice a day or once a day. An upper abdominal ultrasound in 2009 showed mild intrahepatic duct dilatation and a common bile duct of 11 mm. His liver function tests were normal. He had a laparoscopic cholecystectomy in 1985. His last colonoscopy in April 2009 showed a tubular adenoma. His mother had colon cancer. He will be due for a recall colonoscopy in April 2014. He has a history of appendagitis.   Past Medical History  Diagnosis Date  . DDD (degenerative disc disease)   . History of skin cancer   . Gastritis   . Esophageal stricture   . Hx of adenomatous colonic polyps   . Internal hemorrhoids   . Diverticulosis   . Glaucoma   . History of prostate cancer   . Hepatic cyst   . GERD (gastroesophageal reflux disease)   . Hyperlipidemia   . Gilbert syndrome   . TMJ (dislocation of temporomandibular joint)   . Pulmonary nodule    Past Surgical History  Procedure Date  . Cholecystectomy 1985  . Colonoscopy w/ polypectomy 2004, 2009    Neg-2009, Dr.Tniya Bowditch  . Prostatectomy 07/2005    Dr.Borden  . Rotator cuff repair     x 2-Left  . Appendectomy   . Cataract surgery     x 2  . Mohs surgery     reports that he has never smoked. He does not have any smokeless tobacco history on file. He reports that he drinks alcohol. He reports that he does not use illicit drugs. family history includes Colon cancer in his mother; Coronary artery disease in his brother; Heart attack (age of onset:65) in his maternal uncle; Heart failure in his mother; and Multiple myeloma in his father. Allergies    Allergen Reactions  . Aspirin   . Codeine     REACTION: agitation, mental status changes with high doses of codeine post op  . Nabumetone         Review of Systems:  The remainder of the 10  point ROS is negative except as outlined in H&P   Physical Exam: General appearance  Well developed, in no distress. Eyes- non icteric. HEENT nontraumatic, normocephalic. Mouth no lesions, tongue papillated, no cheilosis. Neck supple without adenopathy, thyroid not enlarged, no carotid bruits, no JVD. Lungs Clear to auscultation bilaterally. Cor normal S1 normal S2, regular rhythm , no murmur,  quiet precordium. Abdomen soft nontender abdomen with normal active bowel sounds. No distention. Liver edge at costal margin. Rectal: Soft Hemoccult negative stool. Extremities no pedal edema. Skin no lesions. Neurological alert and oriented x 3. Psychological normal mood and affect.  Assessment and Plan:  Problem #1 gastroesophageal reflux under good control with Prilosec 20 mg twice a day or once a day depending on his symptoms. He denies any symptoms of dysphagia at this time. There is no need for a repeat endoscopy unless he becomes symptomatic.   Problem #2 family history of colon cancer and personal history of a tubular adenoma. He is due for a recall colonoscopy in April 2014.     11/01/2010 Lina Sar

## 2010-11-01 NOTE — Patient Instructions (Addendum)
Take Prilosec 1 tablet by mouth twice daily. We have sent a new prescription for this. You will be due for a colonoscopy in April 2014. We will send you a reminder in the mail. Check B12 level and LFT's at the next appointm with Dr [name] in Aug.2012  Dr [name]

## 2011-01-22 ENCOUNTER — Encounter: Payer: Self-pay | Admitting: Internal Medicine

## 2011-01-29 ENCOUNTER — Encounter: Payer: Self-pay | Admitting: Internal Medicine

## 2011-02-05 ENCOUNTER — Ambulatory Visit (INDEPENDENT_AMBULATORY_CARE_PROVIDER_SITE_OTHER): Payer: Medicare Other | Admitting: Internal Medicine

## 2011-02-05 ENCOUNTER — Encounter: Payer: Self-pay | Admitting: Internal Medicine

## 2011-02-05 VITALS — BP 102/60 | HR 59 | Temp 98.3°F | Resp 14 | Ht 67.0 in | Wt 166.2 lb

## 2011-02-05 DIAGNOSIS — K2940 Chronic atrophic gastritis without bleeding: Secondary | ICD-10-CM

## 2011-02-05 DIAGNOSIS — K7689 Other specified diseases of liver: Secondary | ICD-10-CM

## 2011-02-05 DIAGNOSIS — Z0000 Encounter for general adult medical examination without abnormal findings: Secondary | ICD-10-CM

## 2011-02-05 DIAGNOSIS — Z860100 Personal history of colon polyps, unspecified: Secondary | ICD-10-CM

## 2011-02-05 DIAGNOSIS — Z136 Encounter for screening for cardiovascular disorders: Secondary | ICD-10-CM

## 2011-02-05 DIAGNOSIS — R9431 Abnormal electrocardiogram [ECG] [EKG]: Secondary | ICD-10-CM

## 2011-02-05 DIAGNOSIS — Z79899 Other long term (current) drug therapy: Secondary | ICD-10-CM

## 2011-02-05 DIAGNOSIS — E782 Mixed hyperlipidemia: Secondary | ICD-10-CM

## 2011-02-05 DIAGNOSIS — Z8546 Personal history of malignant neoplasm of prostate: Secondary | ICD-10-CM

## 2011-02-05 DIAGNOSIS — C4490 Unspecified malignant neoplasm of skin, unspecified: Secondary | ICD-10-CM

## 2011-02-05 LAB — CBC WITH DIFFERENTIAL/PLATELET
Basophils Absolute: 0 10*3/uL (ref 0.0–0.1)
Eosinophils Absolute: 0.2 10*3/uL (ref 0.0–0.7)
Hemoglobin: 15 g/dL (ref 13.0–17.0)
Lymphocytes Relative: 28 % (ref 12.0–46.0)
Lymphs Abs: 1.4 10*3/uL (ref 0.7–4.0)
Monocytes Absolute: 0.5 10*3/uL (ref 0.1–1.0)
Monocytes Relative: 11.1 % (ref 3.0–12.0)
RBC: 4.61 Mil/uL (ref 4.22–5.81)

## 2011-02-05 LAB — BASIC METABOLIC PANEL
CO2: 28 mEq/L (ref 19–32)
Calcium: 9.1 mg/dL (ref 8.4–10.5)
GFR: 76.24 mL/min (ref >60.00)
Sodium: 141 mEq/L (ref 135–145)

## 2011-02-05 LAB — HEPATIC FUNCTION PANEL
Bilirubin, Direct: 0.1 mg/dL (ref 0.0–0.3)
Total Bilirubin: 1.2 mg/dL (ref 0.3–1.2)
Total Protein: 7.5 g/dL (ref 6.0–8.3)

## 2011-02-05 LAB — LDL CHOLESTEROL, DIRECT: Direct LDL: 78.8 mg/dL

## 2011-02-05 LAB — LIPID PANEL
Cholesterol: 144 mg/dL (ref 0–200)
Total CHOL/HDL Ratio: 3
Triglycerides: 204 mg/dL — ABNORMAL HIGH (ref 0.0–149.0)

## 2011-02-05 NOTE — Patient Instructions (Signed)
Preventive Health Care: Exercise at least 30-45 minutes a day,  3-4 days a week.  Eat a low-fat diet with lots of fruits and vegetables, up to 7-9 servings per day. Consume less than 40 grams of sugar per day from foods & drinks with High Fructose Corn Sugar as # 1,2,3 or # 4 on label.  

## 2011-02-05 NOTE — Progress Notes (Signed)
Subjective:    Patient ID: Edwin Ramirez, male    DOB: 06-06-1941, 70 y.o.   MRN: [id number]  HPI Medicare Wellness Visit:  The following psychosocial & medical history were reviewed as required by Medicare.   Social history: caffeine: 3 cups coffee/ day , alcohol:  4-5 glasses wine / wek ,  tobacco use : never  & exercise : walking 2 mpd  5X/ week.   Home & personal  safety / fall risk: no issues, activities of daily living: no limitations , seatbelt use : yes , and smoke alarm employment : yes .  Power of Attorney/Living Will status : in place  Vision ( as recorded per Nurse) & Hearing  evaluation :  Wall chart read @ 6 ft; hearing aids from [employer]. Orientation :oriented X 3 , memory & recall :good, spelling or math testing: good,and mood & affect : normal . Depression / anxiety: denied Travel history : 6/12 [place] , immunization status :up to date , transfusion history:  no, and preventive health surveillance ( colonoscopies, BMD , etc as per protocol/ [facility]): colonoscopy due in 2014, Dental care:  Seen every 6 mos . Chart reviewed &  Updated. Active issues reviewed & addressed.       Review of Systems   The gastrologist office visit of 11/01/10 with Dr [name]  was reviewed. B12 and liver function test were requested because of his gastritis history.     Objective:   Physical Exam Gen.: Healthy and well-nourished in appearance. Alert, appropriate and cooperative throughout exam. Head: Normocephalic without obvious abnormalities;  no alopecia  Eyes: No corneal or conjunctival inflammation noted. Pupils equal round reactive to light and accommodation. Fundal exam is benign without hemorrhages, exudate, papilledema. Extraocular motion intact. Vision grossly normal. Ears: External  ear exam reveals no significant lesions or deformities. Canals clear .TMs normal. Hearing is grossly normal bilaterally. Nose: External nasal exam reveals no deformity or inflammation. Nasal mucosa are  pink and moist. No lesions or exudates noted.  Mouth: Oral mucosa and oropharynx reveal no lesions or exudates. Teeth in good repair. Neck: No deformities, masses, or tenderness noted. Range of motion &. Thyroid  normal. Lungs: Normal respiratory effort; chest expands symmetrically. Lungs are clear to auscultation without rales, wheezes, or increased work of breathing. Heart: Slow rate and regular  rhythm. Normal S1 and S2. No gallop, click, or rub. No  murmur. Abdomen: Bowel sounds normal; abdomen soft and nontender. No masses, organomegaly or hernias noted. Genitalia: Dr Laverle Patter   .                                                                                   Musculoskeletal/extremities: No deformity or scoliosis noted of  the thoracic or lumbar spine. No clubbing, cyanosis, edema, or deformity noted. Range of motion  normal .Tone & strength  normal.Joints normal. Nail health  good. Vascular: Carotid, radial artery, dorsalis pedis and  posterior tibial pulses are full and equal. No bruits present. Neurologic: Alert and oriented x3. Deep tendon reflexes symmetrical and normal.          Skin: Intact without suspicious lesions or rashes. Keratotic changes especially over  forearms Lymph: No cervical, axillary, or inguinal lymphadenopathy present. Psych: Mood and affect are normal. Normally interactive                                                                                         Assessment & Plan:  #1 Medicare Wellness Exam; criteria met ; data entered #2 Problem List reviewed ; Assessment/ Recommendations made Plan: see Orders    Note: EKG reveals incomplete right bundle branch block and early repolarization ST-T wave changes. He was asked to carry  EKG when  traveling. The early repolarization changes could be mistaken for early injury pattern.

## 2011-02-13 ENCOUNTER — Telehealth: Payer: Self-pay | Admitting: Internal Medicine

## 2011-02-14 NOTE — Telephone Encounter (Signed)
Pt requesting copy of OV.Discuss with patient and copy mailed to Pt

## 2011-03-25 ENCOUNTER — Other Ambulatory Visit: Payer: Self-pay | Admitting: Internal Medicine

## 2011-03-28 ENCOUNTER — Ambulatory Visit (INDEPENDENT_AMBULATORY_CARE_PROVIDER_SITE_OTHER): Payer: Medicare Other

## 2011-03-28 DIAGNOSIS — Z23 Encounter for immunization: Secondary | ICD-10-CM

## 2011-05-23 ENCOUNTER — Encounter: Payer: Self-pay | Admitting: Internal Medicine

## 2011-05-23 ENCOUNTER — Ambulatory Visit (INDEPENDENT_AMBULATORY_CARE_PROVIDER_SITE_OTHER): Payer: Medicare Other | Admitting: Internal Medicine

## 2011-05-23 VITALS — BP 118/64 | HR 65 | Temp 97.5°F | Wt 171.2 lb

## 2011-05-23 DIAGNOSIS — M25469 Effusion, unspecified knee: Secondary | ICD-10-CM

## 2011-05-23 DIAGNOSIS — M25562 Pain in left knee: Secondary | ICD-10-CM

## 2011-05-23 DIAGNOSIS — M25462 Effusion, left knee: Secondary | ICD-10-CM

## 2011-05-23 DIAGNOSIS — M25569 Pain in unspecified knee: Secondary | ICD-10-CM

## 2011-05-23 MED ORDER — CELECOXIB 200 MG PO CAPS: 200.0000 mg | ORAL_CAPSULE | Freq: Every day | ORAL | Status: DC

## 2011-05-23 NOTE — Progress Notes (Signed)
  Subjective:    Patient ID: Edwin Ramirez, male    DOB: 02-Jun-1941, 70 y.o.   MRN: [id number]  HPI Extremity pain Location:L knee Onset:2 weeks ago Trigger/injury:repetitive climbing of 8 ft ladder Pain quality:initially sharp Pain severity: Duration: Radiation:across top of  patellar Exacerbating factors:usually stick shift; climbing stairs Treatment/response:ice helps Review of systems: Constitutional: no fever, chills, sweats, change in weight  Musculoskeletal:no  muscle cramps or pain; no  joint stiffness, redness.Some  swelling Skin:no rash, color change Neuro: weakness  Only with stairs. No incontinence (stool/urine); numbness and tingling Heme:no lymphadenopathy; abnormal bruising or bleeding        Review of Systems     Objective:   Physical Exam is healthy and well-nourished in appearance  Skin reveals no rashes over the knees. There are prominent venous spiders in the thighs.  Pedal pulses are intact and there is no edema or clubbing.  There is marked crepitus of the knees bilaterally. There is suggestion of a small effusion on the left. Range of motion is normal. Strength, tone deep tendon reflexes are also normal.        Assessment & Plan:  #1 knee pain secondary to repetitive motion(climbing a ladder). Small effusion appears to be present. He has a history of intolerance to aspirin-type agents because of gastritis.  Plan: Glucosamine 1500 mg daily for 4-6 weeks. Celebrex samples twice a day. He has topical Voltaren gel he obtained in [place] can be used twice a day  OR  apply warm moist compresses.

## 2011-05-23 NOTE — Patient Instructions (Signed)
Use warm moist compresses 2- 3 times a day to the knee OR apply Voltaren gel 2 X / day . Consider glucosamine sulfate 1500 mg daily for joint symptoms. Take this daily  for 3 months on & then 2 months off. This will rehydrate the cartilages.

## 2011-07-11 ENCOUNTER — Other Ambulatory Visit: Payer: Self-pay | Admitting: Internal Medicine

## 2011-07-11 MED ORDER — ROSUVASTATIN CALCIUM 20 MG PO TABS: 20.0000 mg | ORAL_TABLET | Freq: Every day | ORAL | Status: DC

## 2011-07-11 NOTE — Telephone Encounter (Signed)
RX sent

## 2011-07-15 ENCOUNTER — Ambulatory Visit (INDEPENDENT_AMBULATORY_CARE_PROVIDER_SITE_OTHER): Payer: Medicare Other | Admitting: Internal Medicine

## 2011-07-15 ENCOUNTER — Encounter: Payer: Self-pay | Admitting: Internal Medicine

## 2011-07-15 DIAGNOSIS — M5412 Radiculopathy, cervical region: Secondary | ICD-10-CM

## 2011-07-15 DIAGNOSIS — H6980 Other specified disorders of Eustachian tube, unspecified ear: Secondary | ICD-10-CM

## 2011-07-15 MED ORDER — FLUTICASONE PROPIONATE 50 MCG/ACT NA SUSP: 1.0000 | Freq: Two times a day (BID) | NASAL | Status: DC | PRN

## 2011-07-15 NOTE — Progress Notes (Signed)
  Subjective:    Patient ID: Edwin Ramirez, male    DOB: Dec 26, 1940, 71 y.o.   MRN: [id number]  HPI EAR PAIN Location: left    Onset:after valsalva maneuver @ stool for constipation 1 week ago in context of cold Symptoms  Sensation of fullness: yes,  Ear discharge: no URI symptoms: no , 95% gone with Neti pot Fever: no    Tinnitus: no Dizziness: no Hearing loss: nothing new Headache: no Toothache: minor in L maxillae Jaw click: no Rashes or lesions: no Facial muscle weakness: no  Red Flags Recent trauma: no PMH recurrent OM: no  PMH prior ear surgery: no   Recent antibiotic usage (last 30 days):  no Diabetes or Immunosuppresion:  no PMH of TMJ; resolved with bite block     Review of Systems He has intermittent cervical radiculopathy symptoms. He's been evaluated by a spine specialist. He thought it was at the C4 level; but the numbness and tingling is in the fourth and fifth fingers intermittently upon awakening. He also has some numbness and tingling of left upper back.  He denies any incontinence of stool or urine     Objective:   Physical Exam General appearance:good health ;well nourished; no acute distress or increased work of breathing is present.  No  lymphadenopathy about the head, neck, or axilla noted.   Eyes: No conjunctival inflammation or lid edema is present. EOMI .  Ears:  External ear exam shows no significant lesions or deformities.  Otoscopic examination reveals clear canals, tympanic membranes are intact bilaterally without bulging, retraction, inflammation or discharge. He is not wearing his headaches; there is dramatic hearing loss bilaterally.  Nose:  External nasal examination shows no deformity or inflammation. Nasal mucosa are pink and moist without lesions or exudates. No septal dislocation or deviation.No obstruction to airflow.   Oral exam: Dental hygiene is good; lips and gums are healthy appearing.There is no oropharyngeal erythema or  exudate noted. With mastication maneuvering; there is no significant crepitus or tenderness over the TMJ joint.   Neck:  No deformities, thyromegaly, masses, or tenderness noted.   Supple with full range of motion without pain.   Heart:  Normal rate and regular rhythm. S1 and S2 normal without gallop, murmur, click, rub or other extra sounds.   Lungs:Chest clear to auscultation; no wheezes, rhonchi,rales ,or rubs present.No increased work of breathing.    Chest: There is no malalignment of the spine at the right parasternal muscles are larger than the left suggesting subclinical scoliosis.  Extremities:  No cyanosis, edema, or clubbing  noted    Neuro: Deep tendon reflexes, strength and tone normal. There appears to be no weakness in the eighth cervical nerve region either side   Skin: Warm & dry w/o jaundice or tenting.          Assessment & Plan:   #1 otic pressure; this most likely represents eustachian tube dysfunction secondary to Valsalva . Other than the significant hearing loss the ENT exam is unremarkable  #2 C8 radicular symptoms with sleep. No neuromuscular deficit present on exam  #3 TMJ, inactive  Plan: See orders and recommendations

## 2011-07-15 NOTE — Patient Instructions (Signed)
Use a cervical memory foam pillow to prevent hyperextension or hyperflexion of the cervical spine. See Dr Ethelene Hal if symptoms persist Go to [facility] for eustachian tube dysfunction. Drink thin  fluids liberally through the day and chew sugarless gum . Do the Valsalva maneuver several times a day to "pop" ears open ;  Stop if this causes pain. Flonase 1 spray in each nostril twice a day as needed. Use the "crossover" technique as discussed. Use a Neti pot daily- 2x /day  as needed for sinus congestion

## 2011-08-20 ENCOUNTER — Other Ambulatory Visit: Payer: Self-pay | Admitting: *Deleted

## 2011-08-20 MED ORDER — OMEPRAZOLE 20 MG PO CPDR: 20.0000 mg | DELAYED_RELEASE_CAPSULE | Freq: Two times a day (BID) | ORAL | Status: DC

## 2012-01-03 ENCOUNTER — Other Ambulatory Visit: Payer: Self-pay | Admitting: *Deleted

## 2012-01-03 MED ORDER — OMEPRAZOLE 20 MG PO CPDR: 20.0000 mg | DELAYED_RELEASE_CAPSULE | Freq: Two times a day (BID) | ORAL | Status: DC

## 2012-01-22 ENCOUNTER — Other Ambulatory Visit: Payer: Self-pay | Admitting: Internal Medicine

## 2012-01-22 NOTE — Telephone Encounter (Signed)
refill  CRESTOR 20 MG Take 1 tablet (20 mg total) by mouth daily -90 day supply  Last wrt 1.31.13 #90 wt/1-refill  Last ov 2.4.13-acute

## 2012-01-23 MED ORDER — ROSUVASTATIN CALCIUM 20 MG PO TABS: 20.0000 mg | ORAL_TABLET | Freq: Every day | ORAL | Status: DC

## 2012-01-23 NOTE — Telephone Encounter (Signed)
RX sent

## 2012-02-06 ENCOUNTER — Ambulatory Visit (INDEPENDENT_AMBULATORY_CARE_PROVIDER_SITE_OTHER): Payer: Medicare Other | Admitting: Internal Medicine

## 2012-02-06 ENCOUNTER — Encounter: Payer: Self-pay | Admitting: Internal Medicine

## 2012-02-06 VITALS — BP 124/66 | HR 55 | Temp 97.6°F | Ht 67.0 in | Wt 168.0 lb

## 2012-02-06 DIAGNOSIS — E782 Mixed hyperlipidemia: Secondary | ICD-10-CM

## 2012-02-06 DIAGNOSIS — S8990XA Unspecified injury of unspecified lower leg, initial encounter: Secondary | ICD-10-CM

## 2012-02-06 DIAGNOSIS — Z0000 Encounter for general adult medical examination without abnormal findings: Secondary | ICD-10-CM

## 2012-02-06 DIAGNOSIS — H4040X0 Glaucoma secondary to eye inflammation, unspecified eye, stage unspecified: Secondary | ICD-10-CM

## 2012-02-06 DIAGNOSIS — E785 Hyperlipidemia, unspecified: Secondary | ICD-10-CM

## 2012-02-06 DIAGNOSIS — H409 Unspecified glaucoma: Secondary | ICD-10-CM

## 2012-02-06 DIAGNOSIS — M7120 Synovial cyst of popliteal space [Baker], unspecified knee: Secondary | ICD-10-CM

## 2012-02-06 DIAGNOSIS — K219 Gastro-esophageal reflux disease without esophagitis: Secondary | ICD-10-CM

## 2012-02-06 DIAGNOSIS — Z8546 Personal history of malignant neoplasm of prostate: Secondary | ICD-10-CM

## 2012-02-06 DIAGNOSIS — S99919A Unspecified injury of unspecified ankle, initial encounter: Secondary | ICD-10-CM

## 2012-02-06 LAB — LIPID PANEL
HDL: 42 mg/dL (ref >39.00)
LDL Cholesterol: 68 mg/dL (ref 0–99)
Total CHOL/HDL Ratio: 3
VLDL: 26 mg/dL (ref 0.0–40.0)

## 2012-02-06 LAB — CBC WITH DIFFERENTIAL/PLATELET
Basophils Absolute: 0 10*3/uL (ref 0.0–0.1)
Eosinophils Absolute: 0.2 10*3/uL (ref 0.0–0.7)
Lymphocytes Relative: 27.8 % (ref 12.0–46.0)
MCHC: 32.1 g/dL (ref 30.0–36.0)
Neutrophils Relative %: 56.9 % (ref 43.0–77.0)
Platelets: 133 10*3/uL — ABNORMAL LOW (ref 150.0–400.0)
RDW: 13.4 % (ref 11.5–14.6)

## 2012-02-06 LAB — BASIC METABOLIC PANEL
CO2: 27 mEq/L (ref 19–32)
Calcium: 9 mg/dL (ref 8.4–10.5)
Creatinine, Ser: 0.9 mg/dL (ref 0.4–1.5)
Glucose, Bld: 89 mg/dL (ref 70–99)

## 2012-02-06 LAB — HEPATIC FUNCTION PANEL
Albumin: 4.1 g/dL (ref 3.5–5.2)
Alkaline Phosphatase: 61 U/L (ref 39–117)
Bilirubin, Direct: 0.2 mg/dL (ref 0.0–0.3)
Total Bilirubin: 1.6 mg/dL — ABNORMAL HIGH (ref 0.3–1.2)

## 2012-02-06 LAB — TSH: TSH: 1.48 u[IU]/mL (ref 0.35–5.50)

## 2012-02-06 MED ORDER — TRAMADOL HCL 50 MG PO TABS: ORAL_TABLET | ORAL | Status: DC

## 2012-02-06 MED ORDER — LORAZEPAM 1 MG PO TABS: 0.5000 mg | ORAL_TABLET | Freq: Three times a day (TID) | ORAL | Status: DC | PRN

## 2012-02-06 NOTE — Patient Instructions (Addendum)
Preventive Health Care: Exercise at least 30-45 minutes a day,  3-4 days a week once knee issue resolved  Eat a low-fat diet with lots of fruits and vegetables, up to 7-9 servings per day. Consume less than 40 grams of sugar per day from foods & drinks with High Fructose Corn Sugar as # 1,2,3 or # 4 on label. Eye Doctor - have an eye exam @ least annually.                                                         Please try to go on My Chart within the next 24 hours to allow me to release the results directly to you.

## 2012-02-06 NOTE — Progress Notes (Signed)
Subjective:    Patient ID: Edwin Ramirez, male    DOB: 1940/10/25, 71 y.o.   MRN: [id number]  HPI  Medicare Wellness Visit:  The following psychosocial & medical history were reviewed as required by Medicare.   Social history: caffeine: 3 cups / day , alcohol:  5 glasses of wine / week ,  tobacco use : never  & exercise : decreased due to knee; prev 2-3 mpd 5X/week.   Home & personal  safety / fall risk: no issues, activities of daily living: no limitations , seatbelt use : yes , and smoke alarm employment : yes .  Power of Attorney/Living Will status : in place  Vision ( as recorded per Nurse) & Hearing  evaluation :  Ophth exam 6/13. Hearing aids from [place]. Orientation :oriented X 3 , memory & recall :good, spelling  testing: good,and mood & affect : normal . Depression / anxiety: denied Travel history : 6/12 [place] , immunization status : up to date , transfusion history: no, and preventive health surveillance ( colonoscopies, BMD , etc as per protocol/ [facility]): colonoscopy due 2014, Dental care:  Seen every 6 mos . Chart reviewed &  Updated. Active issues reviewed & addressed.       Review of Systems Extremity pain Location:L knee Onset:2 weeks ago Trigger/injury:jumped into 4 ft pool Pain quality:sharp Pain severity:up to 6 Duration:intermittent for up to hours Radiation:no Exacerbating factors:walking, flexion Treatment/response:support band , sleep, ice, & Tramadol  all help  Review of systems: Constitutional: no fever, chills, sweats Musculoskeletal:no  muscle cramps or pain; no  joint stiffness, redness, or swelling except L popliteal space Skin:no rash, color change Neuro: no weakness; incontinence (stool/urine); numbness and tingling Heme:no lymphadenopathy; abnormal bruising or bleeding        Objective:   Physical Exam Gen.: well-nourished in appearance. Alert, appropriate and cooperative throughout exam.Appears younger than stated age  Head: Normocephalic  without obvious abnormalities;  Moustache ; no alopecia  Eyes: No corneal or conjunctival inflammation noted. Extraocular motion intact.  Ears: External  ear exam reveals no significant lesions or deformities.  Hearing aids bilaterally. Nose: External nasal exam reveals no deformity or inflammation. Nasal mucosa are pink and moist. No lesions or exudates noted.   Mouth: Oral mucosa and oropharynx reveal no lesions or exudates. Teeth in good repair. Neck: No deformities, masses, or tenderness noted. Range of motion decreased laterally. Thyroid normal. Lungs: Normal respiratory effort; chest expands symmetrically. Lungs are clear to auscultation without rales, wheezes, or increased work of breathing. Heart: Slow rate and regular  rhythm. Normal S1 and S2. No gallop, click, or rub. Grade 1/2 over 6 systolic murmur  Abdomen: Bowel sounds normal; abdomen soft and nontender. No masses, organomegaly or hernias noted. Genitalia: Dr Laverle Patter                                                     Musculoskeletal/extremities: No deformity or scoliosis noted of  the thoracic or lumbar spine. No clubbing, cyanosis, edema, or deformity noted. Range of motion decreased L knee; L lateral popliteal cyst.Small effusion R knee. Crepitus L >R knee.Tone & strength  normal.Joints normal. Nail health  good. Vascular: Carotid, radial artery, dorsalis pedis and  posterior tibial pulses are full and equal. No bruits present. Neurologic: Alert and oriented x3. Deep tendon reflexes symmetrical and  normal.          Skin: Intact without suspicious lesions or rashes. Lymph: No cervical, axillary lymphadenopathy present. Psych: Mood and affect are normal. Normally interactive                                                                                         Assessment & Plan:  #1 Medicare Wellness Exam; criteria met ; data entered #2 Problem List reviewed ; Assessment/ Recommendations made #3 left knee trauma with  popliteal cyst, symptomatic Plan: see Orders

## 2012-03-05 ENCOUNTER — Ambulatory Visit (INDEPENDENT_AMBULATORY_CARE_PROVIDER_SITE_OTHER): Payer: Medicare Other

## 2012-03-05 DIAGNOSIS — Z23 Encounter for immunization: Secondary | ICD-10-CM

## 2012-04-27 ENCOUNTER — Other Ambulatory Visit: Payer: Self-pay | Admitting: Internal Medicine

## 2012-04-27 MED ORDER — ROSUVASTATIN CALCIUM 20 MG PO TABS: 20.0000 mg | ORAL_TABLET | Freq: Every day | ORAL | Status: DC

## 2012-04-27 NOTE — Telephone Encounter (Signed)
refill CRESTOR 20 MG Take 1 tablet (20 mg total) by mouth daily. --#90 last ov 8.29.13 Annual exam -- last wrt 8.15.13 #90

## 2012-04-27 NOTE — Telephone Encounter (Signed)
RX sent

## 2012-05-12 ENCOUNTER — Encounter: Payer: Self-pay | Admitting: Internal Medicine

## 2012-05-12 ENCOUNTER — Ambulatory Visit (INDEPENDENT_AMBULATORY_CARE_PROVIDER_SITE_OTHER): Payer: Medicare Other | Admitting: Internal Medicine

## 2012-05-12 VITALS — BP 110/60 | HR 60 | Temp 97.9°F | Wt 168.8 lb

## 2012-05-12 DIAGNOSIS — M654 Radial styloid tenosynovitis [de Quervain]: Secondary | ICD-10-CM

## 2012-05-12 NOTE — Progress Notes (Signed)
  Subjective:    Patient ID: Edwin Ramirez, male    DOB: [birth year], 71 y.o.   MRN: [id number]  HPI Extremity pain Location:R > L  wrist & thumb Onset:3 weeks ago Trigger/injury:not definitely Pain quality: burning @ ventral wrist; numbness w/o tingling @ R thumb tip Pain severity:up to 8 on R ; 5 on L Duration:hour or less Radiation: across base of thumb & into thenar imminence Exacerbating factors:RLDP; twisting cap off bottle; holding weight  & shaking hands Treatment/response:ice ? helped , brace @ night did not      Review of Systems Constitutional: no fever, chills, sweats, change in weight  Musculoskeletal:no  muscle cramps or pain; no  joint stiffness, redness, or swelling Skin:no rash, color/temp  change Neuro: Grip weakness; no incontinence (stool/urine)  Heme:no lymphadenopathy; abnormal bruising or bleeding      Objective:   Physical Exam   He appears healthy and well-nourished in no distress  Radial artery pulses are normal.  Deep tendon reflexes at the biceps are normal  Skin reveals no lesions of the hands.  Range of motion, strength, and time and is normal. He has the greatest discomfort with wrist flexion or medial rotation. There is also tenderness to palpation over the base of the right thumb. Finklestein's test is equivocal. Tinel's is negative bilaterally        Assessment & Plan:  # 1 wrist tenosynovitis Plan hand Surgery referral

## 2012-05-12 NOTE — Patient Instructions (Addendum)
Use an anti-inflammatory cream such as Aspercreme or Zostrix cream twice a day to the thumb base as needed. In lieu of this warm moist compresses or  hot water bottle can be used. Do not apply ice .

## 2012-06-13 ENCOUNTER — Ambulatory Visit (INDEPENDENT_AMBULATORY_CARE_PROVIDER_SITE_OTHER): Payer: Medicare Other | Admitting: Family Medicine

## 2012-06-13 VITALS — BP 112/64 | HR 59 | Temp 97.9°F | Resp 16 | Ht 66.0 in | Wt 171.0 lb

## 2012-06-13 DIAGNOSIS — R599 Enlarged lymph nodes, unspecified: Secondary | ICD-10-CM

## 2012-06-13 DIAGNOSIS — K1370 Unspecified lesions of oral mucosa: Secondary | ICD-10-CM

## 2012-06-13 DIAGNOSIS — K1379 Other lesions of oral mucosa: Secondary | ICD-10-CM

## 2012-06-13 DIAGNOSIS — R591 Generalized enlarged lymph nodes: Secondary | ICD-10-CM

## 2012-06-13 MED ORDER — AMOXICILLIN-POT CLAVULANATE 500-125 MG PO TABS: 1.0000 | ORAL_TABLET | Freq: Two times a day (BID) | ORAL | Status: DC

## 2012-06-13 NOTE — Progress Notes (Signed)
[facility] [address], [place] [address] [phone]  Date:  06/13/2012   Name:  Edwin Ramirez   DOB:  07-12-1940   MRN:  [id number]  PCP:  Marga Melnick, MD    Chief Complaint: Salivary Gland   History of Present Illness:  Edwin Ramirez is a 72 y.o. very pleasant male patient who presents with the following:  He is here with a possible salivary gland problems today. Yesterday he noted swelling and tenderness under his tongue on the right side.  He has noted some swelling of his cervical glands as well.    He took some tylenol yesterday and today.  The pain will sometimes radiate into his right ear He has never had this in the past He has not noted a fever, body aches or chills.  Otherwise he feels well No cough He has been sneezing a lot- this is not unusual for him No ST.   He had a GI bug a week ago- this is now cleared up.    Never a smoker, does not use smokeless tobacco either   Patient Active Problem List  Diagnosis  . CARCINOMA, BASAL CELL  . HYPERLIPIDEMIA  . GILBERT'S SYNDROME  . Glaucoma associated with ocular inflammations  . DECREASED HEARING  . HEMORRHOIDS, INTERNAL  . PULMONARY NODULE  . ESOPHAGEAL STRICTURE  . GERD  . DIVERTICULOSIS OF COLON  . HEPATIC CYST  . PROSTATE CANCER, HX OF  . COLONIC POLYPS, ADENOMATOUS, HX OF    Past Medical History  Diagnosis Date  . DDD (degenerative disc disease)   . Basal cell cancer     Dr Nicholas Lose  . Gastritis   . Esophageal stricture     Dr [name]  . Hx of adenomatous colonic polyps   . Internal hemorrhoids   . Diverticulosis   . Glaucoma(365)   . History of prostate cancer     Dr Laverle Patter  . Hepatic cyst   . GERD (gastroesophageal reflux disease)   . Hyperlipidemia   . Gilbert syndrome   . TMJ (dislocation of temporomandibular joint)   . Pulmonary nodule     Past Surgical History  Procedure Date  . Cholecystectomy 1985  . Colonoscopy w/ polypectomy 2004   Negative 2009, Dr.Brodie, due 2014  . Prostatectomy 07/2005    Dr.Borden  . Rotator cuff repair      X 2-Left; Dr Eulah Pont  . Appendectomy   . Cataract surgery 2012    bilateral  . Mohs surgery     Basal Cell  . Esophageal dilation     X 2    History  Substance Use Topics  . Smoking status: Never Smoker   . Smokeless tobacco: Not on file  . Alcohol Use: 3.0 oz/week    5 Glasses of wine per week     Comment: Socially    Family History  Problem Relation Age of Onset  . Multiple myeloma Father   . Colon cancer Mother 41  . Heart failure Mother   . Coronary artery disease Brother   . Heart attack Maternal Uncle 65  . Diabetes Neg Hx   . Stroke Neg Hx     Allergies  Allergen Reactions  . Codeine     REACTION: agitation, mental status changes with high doses of codeine post op  . Nabumetone     Mental status changes post op with Relafen  . Aspirin     gastritis    Medication  list has been reviewed and updated.  Current Outpatient Prescriptions on File Prior to Visit  Medication Sig Dispense Refill  . Glucosamine 500 MG CAPS Take 500 mg by mouth. 2 by mouth in the morning       . latanoprost (XALATAN) 0.005 % ophthalmic solution Place 1 drop into both eyes at bedtime.       Marland Kitchen LORazepam (ATIVAN) 1 MG tablet Take 0.5 tablets (0.5 mg total) by mouth every 8 (eight) hours as needed.  30 tablet  2  . Multiple Vitamin (MULTIVITAMIN) tablet Take 1 tablet by mouth daily.        Marland Kitchen omeprazole (PRILOSEC) 20 MG capsule Take 1 capsule (20 mg total) by mouth 2 (two) times daily.  180 capsule  1  . rosuvastatin (CRESTOR) 20 MG tablet Take 1 tablet (20 mg total) by mouth daily.  90 tablet  2  . traMADol (ULTRAM) 50 MG tablet TAKE ONE (1) EVERY 6 HOURS AS NEEDED    FOR PAIN  30 tablet  2    Review of Systems:  As per HPI- otherwise negative.   Physical Examination: Filed Vitals:   06/13/12 1605  BP: 112/64  Pulse: 59  Temp: 97.9 F (36.6 C)  Resp: 16   Filed Vitals:    06/13/12 1605  Height: 5\' 6"  (1.676 m)  Weight: 171 lb (77.565 kg)   Body mass index is 27.60 kg/(m^2). Ideal Body Weight: Weight in (lb) to have BMI = 25: 154.6   GEN: WDWN, NAD, Non-toxic, A & O x 3 HEENT: Atraumatic, Normocephalic. Neck supple. No masses. Bilateral TM wnl, oropharynx normal.  PEERL,EOMI.   Under tongue is a small, red, fleshy mass on the lingual frenulum.   Small submandibular nodes are present bilaterally  Ears and Nose: No external deformity. CV: RRR, No M/G/R. No JVD. No thrill. No extra heart sounds. PULM: CTA B, no wheezes, crackles, rhonchi. No retractions. No resp. distress. No accessory muscle use. EXTR: No c/c/e NEURO Normal gait.  PSYCH: Normally interactive. Conversant. Not depressed or anxious appearing.  Calm demeanor.   Assessment and Plan: 1. Mouth pain  amoxicillin-clavulanate (AUGMENTIN) 500-125 MG per tablet  2. Lymphadenopathy     Lesion under tongue.  Sudden appearance makes oral cancer less likely, but cautioned him regarding the need to rule- out oral cancer if this does not go away.  Will try augmentin as this problem is associated with LAD.  He will call or email with an update in a few days.  See patient instructions for more details.     Abbe Amsterdam, MD

## 2012-06-13 NOTE — Patient Instructions (Addendum)
Use the antibiotic as directed. If you are getting worse please let me know right away. Give me a call or email with an update in 4 or 5 days.  If you are not improved within a week please call your dentist to have him take a look at your mouth.

## 2012-06-17 ENCOUNTER — Ambulatory Visit (INDEPENDENT_AMBULATORY_CARE_PROVIDER_SITE_OTHER): Payer: Medicare Other | Admitting: Internal Medicine

## 2012-06-17 ENCOUNTER — Encounter: Payer: Self-pay | Admitting: Internal Medicine

## 2012-06-17 VITALS — BP 116/62 | HR 60 | Temp 97.8°F | Wt 167.4 lb

## 2012-06-17 DIAGNOSIS — R590 Localized enlarged lymph nodes: Secondary | ICD-10-CM

## 2012-06-17 DIAGNOSIS — R599 Enlarged lymph nodes, unspecified: Secondary | ICD-10-CM

## 2012-06-17 NOTE — Patient Instructions (Addendum)
Use warm moist compresses to 3 times a day to the affected area. Review and correct the record as indicated. Please share record with all medical staff seen.

## 2012-06-17 NOTE — Progress Notes (Signed)
  Subjective:    Patient ID: Edwin Ramirez, male    DOB: [birth year], 72 y.o.   MRN: [id number]  HPI  The records 06/13/12 from the urgent care were reviewed. He was treated with Augmentin for a lesion of the tongue associated with lymphadenopathy.  There is intermittent swelling under the right mandible which is intermittently worse with foods. He has not identified a definite food trigger. It is also associated discomfort radiating to the right preauricular area. He has no associated discharge    Review of Systems  He denies fever, chills or sweats     Objective:   Physical Exam General appearance:good health ;well nourished; no acute distress or increased work of breathing is present.  No  lymphadenopathy of axilla noted.   Eyes: No conjunctival inflammation or lid edema is present.   Ears:  External ear exam shows no significant lesions or deformities.  Otoscopic examination reveals clear canals, tympanic membranes are intact bilaterally without bulging, retraction, inflammation or discharge.Aids bilaterally  Nose:  External nasal examination shows no deformity or inflammation. Nasal mucosa are pink and moist without lesions or exudates. No septal dislocation or deviation.No obstruction to airflow.   Oral exam: Dental hygiene is good; lips and gums are healthy appearing.There is no oropharyngeal erythema or exudate noted. There is no tenderness to percussion of the teeth. No parotid tenderness or enlargement present. No evidence of TMJ. The frenulum under the tongue appears to have an small vascular malformation without active bleeding Neck:  No deformities, thyromegaly,  or tenderness noted.   Supple with full range of motion without pain. 2.5 x 2 cm right submandibular lymph node. Lymph node on the left in same area is approximately 1 x 1 cm   Skin: Warm & dry          Assessment & Plan:  #1 variable submandibular lymphadenopathy without clinical evidence of parotiditis,  dental disease, or TMJ. 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 It must be taken with food. Warm compresses twice a day to the swollen area. ENT consultation is indicated. Monitor her for any food triggers such as citrus or spicy foods.

## 2012-07-09 ENCOUNTER — Other Ambulatory Visit: Payer: Self-pay | Admitting: Dermatology

## 2012-07-13 ENCOUNTER — Encounter: Payer: Self-pay | Admitting: Internal Medicine

## 2012-07-29 ENCOUNTER — Encounter: Payer: Self-pay | Admitting: Lab

## 2012-07-30 ENCOUNTER — Ambulatory Visit (INDEPENDENT_AMBULATORY_CARE_PROVIDER_SITE_OTHER): Payer: Medicare Other | Admitting: Internal Medicine

## 2012-07-30 ENCOUNTER — Encounter: Payer: Self-pay | Admitting: Internal Medicine

## 2012-07-30 ENCOUNTER — Ambulatory Visit (INDEPENDENT_AMBULATORY_CARE_PROVIDER_SITE_OTHER)
Admission: RE | Admit: 2012-07-30 | Discharge: 2012-07-30 | Disposition: A | Discharge status: Home / Self Care | Payer: Medicare Other | Source: Ambulatory Visit | Attending: Internal Medicine | Admitting: Internal Medicine

## 2012-07-30 VITALS — BP 120/70 | HR 61 | Temp 97.5°F | Wt 169.0 lb

## 2012-07-30 DIAGNOSIS — R0789 Other chest pain: Secondary | ICD-10-CM

## 2012-07-30 DIAGNOSIS — S299XXA Unspecified injury of thorax, initial encounter: Secondary | ICD-10-CM

## 2012-07-30 DIAGNOSIS — M899 Disorder of bone, unspecified: Secondary | ICD-10-CM

## 2012-07-30 DIAGNOSIS — R0781 Pleurodynia: Secondary | ICD-10-CM

## 2012-07-30 DIAGNOSIS — S298XXA Other specified injuries of thorax, initial encounter: Secondary | ICD-10-CM

## 2012-07-30 DIAGNOSIS — R079 Chest pain, unspecified: Secondary | ICD-10-CM

## 2012-07-30 NOTE — Progress Notes (Signed)
  Subjective:    Patient ID: Edwin Ramirez, male    DOB: 1941/02/16, 72 y.o.   MRN: [id number]  HPI The pain began on  07/20/12 after falling forward going up steps injuring xiphoid process . 2nd fall on 07/22/12 while ice skating injuring R chest after falling with RUE braced against thorax. It is described as dull pain @ xiphoid process,sharp pain @ R lower ribs  The pain does radiate to R posterior thorax The discomfort last seconds in the xiphoid process area and minutes in the R ribs and posterior ribs. It is exacerbated by coughing, dressing and during sleep while changing positions.  Associated signs/symptoms include  R.forearm bruising which has resolved.  The pain was treated with Tylenol, Tramadol, ice  and moist heat with partial relief.                                                                                Review of Systems Constitutional: no fever, chills, sweats, change in weight  Musculoskeletal:no  muscle cramps; no  joint stiffness, redness, or swelling.  Skin:no rash, color/temp change Neuro: no weakness; incontinence (stool/urine); numbness and tingling Heme:no lymphadenopathy; abnormal bruising or bleeding       Objective:   Physical Exam Gen.: Healthy and well-nourished in appearance. Alert, appropriate and cooperative throughout exam.  Neck: No deformities, masses, or tenderness noted. Range of motion within normal limits.   There is some discomfort to palpation over the right inferior thorax and with deep palpation of the xiphoid. Pain is not elicited by chest compression side to side or anteriorly to posteriorly  Minimal rales are heard at the right base. There is no rub                            Musculoskeletal/extremities:There is some asymmetry of the posterior thoracic musculature suggesting occult scoliosis.  . No clubbing, cyanosis, edema, or significant extremity  deformity noted. Range of motion normal .Tone & strength   Normal. Neurologic: Alert and oriented x3. Deep tendon reflexes symmetrical and normal.  Skin: Intact without suspicious lesions or rashes. Lymph: No cervical, axillary lymphadenopathy present. Psych: Mood and affect are normal. Normally interactive                 Assessment & Plan:  #1 chest wall trauma x2 with probable xiphoid dislocation. Rule out right rib fractures  Plan: He'll continue the tramadol as needed. He declined narcotic pain medication. Films will be completed.

## 2012-07-30 NOTE — Patient Instructions (Addendum)
Use an anti-inflammatory cream such as Aspercreme or Zostrix cream twice a day to the areas of pain as needed. In lieu of this warm moist compresses or  hot water bottle can be used. Do not apply ice . Consider glucosamine sulfate 1500 mg daily for xiphoid pain. Take this daily  for 4-6 weeks. This will rehydrate the cartilages. Order for x-rays entered into  the computer; these will be performed at [address] [facility]. across from [facility]. No appointment is necessary.

## 2012-08-26 ENCOUNTER — Ambulatory Visit (INDEPENDENT_AMBULATORY_CARE_PROVIDER_SITE_OTHER): Payer: Medicare Other | Admitting: Internal Medicine

## 2012-08-26 ENCOUNTER — Encounter: Payer: Self-pay | Admitting: Internal Medicine

## 2012-08-26 VITALS — BP 114/70 | HR 71 | Wt 169.0 lb

## 2012-08-26 DIAGNOSIS — J209 Acute bronchitis, unspecified: Secondary | ICD-10-CM

## 2012-08-26 MED ORDER — AMOXICILLIN 500 MG PO CAPS: 500.0000 mg | ORAL_CAPSULE | Freq: Three times a day (TID) | ORAL | Status: DC

## 2012-08-26 MED ORDER — BENZONATATE 200 MG PO CAPS: 200.0000 mg | ORAL_CAPSULE | Freq: Three times a day (TID) | ORAL | Status: DC | PRN

## 2012-08-26 NOTE — Patient Instructions (Addendum)
Mucinex DM or Robitussin-DM should be effective for cough control; Robitussin D should be avoided as it can raise blood pressure and cause cardiac irregularities. In men it can also cause prostatic dysfunction.Stay well hydrated. Drink to thirst up to 32 ounces of fluids daily.

## 2012-08-26 NOTE — Progress Notes (Signed)
  Subjective:    Patient ID: Edwin Ramirez, male    DOB: [birth year], 72 y.o.   MRN: [id number]  HPI The respiratory tract symptoms began 08/20/12 as chest congestion & nonproductive cough initially. As of 3/17cough with  yellow sputum w/o wheezing  & dyspnea reported. With the cough he's had some chest discomfort at the site of the previous hairline fracture. No exposures reported  to sick family or friends. Environmental factors as triggers were lack of heat X 5 days during ice strom.  Night sweats were present intermittently.   Flu shot current   Treatment with codeine was partially effective. There is no history of asthma ; PMH of  seasonal remote allergies.  The patient had never smoked                      Review of Systems Extrinsic symptoms of itchy/ watery eyes &  sneezing were not present .  Myalgias and arthralgias were not present. Symptoms not present include frontal headache, facial pain, dental pain,sore throat, nasal purulence,   earache, &  otic discharge. Fever & chills were not present .      Objective:   Physical Exam General appearance:good health ;well nourished; no acute distress or increased work of breathing is present.   Eyes: No conjunctival inflammation or lid edema is present.  Ears:  External ear exam shows no significant lesions or deformities.  Otoscopic examination reveals clear canals, tympanic membranes are intact bilaterally without bulging, retraction, inflammation or discharge. Nose:  External nasal examination shows no deformity or inflammation. Nasal mucosa are erythematous without lesions or exudates. No septal dislocation or deviation.No obstruction to airflow.  Oral exam: Dental hygiene is good; lips and gums are healthy appearing.There is no oropharyngeal erythema or exudate noted.  Neck:  No deformities, masses, or tenderness noted.   Supple with full range of motion without pain.  Heart:  Normal rate and regular rhythm.  Split S1 ; S2 normal without gallop, click, rub or murmur.  Lungs:Chest clear to auscultation; no wheezes, rhonchi,rales ,or rubs present.No increased work of breathing.  There is some asymmetry of the posterior thoracic musculature suggesting occult scoliosis. Extremities:  No cyanosis, edema, or clubbing  noted  No  lymphadenopathy about the head, neck, or axilla noted.  Skin: Warm & dry         Assessment & Plan:  #1 acute bronchitis w/o bronchospasm Plan: See orders and recommendations

## 2012-08-27 ENCOUNTER — Telehealth: Payer: Self-pay | Admitting: Internal Medicine

## 2012-08-27 ENCOUNTER — Encounter: Payer: Self-pay | Admitting: Internal Medicine

## 2012-08-27 DIAGNOSIS — IMO0001 Reserved for inherently not codable concepts without codable children: Secondary | ICD-10-CM

## 2012-08-27 DIAGNOSIS — Z1211 Encounter for screening for malignant neoplasm of colon: Secondary | ICD-10-CM

## 2012-08-27 DIAGNOSIS — Z86010 Personal history of colonic polyps: Secondary | ICD-10-CM

## 2012-08-27 NOTE — Telephone Encounter (Signed)
Spoke with patient and scheduled ECOL on 10/09/12 at 10:00 AM at [facility] with Dr. [name]. Pre visit on 09/23/12 at 8:30AM

## 2012-08-27 NOTE — Telephone Encounter (Signed)
Yes, it is reasonable to do EGD since he is having reflux symptoms

## 2012-08-27 NOTE — Telephone Encounter (Signed)
Patient is due for colonoscopy and is asking for EGD also. Per OV note on 10/31/12, would not do EGD unless patient had dysphagia. Patient states he does not have any dysphagia but report for the last year he has not been able to sleep on his left side. States sleeping on left side causes his stomach to push on esophagus and he has nausea. When he gets up the nausea goes away. Does not have problems if he sleeps on right side. He is taking Prilosec daily and BID if he thinks he needs an extra dose(20% of the time he reports).  Please, advise if he needs EGD with colon.

## 2012-09-02 ENCOUNTER — Encounter: Payer: Self-pay | Admitting: Internal Medicine

## 2012-09-23 ENCOUNTER — Encounter: Payer: Self-pay | Admitting: Internal Medicine

## 2012-09-23 ENCOUNTER — Ambulatory Visit (AMBULATORY_SURGERY_CENTER): Payer: Medicare Other | Admitting: *Deleted

## 2012-09-23 VITALS — Ht 67.0 in | Wt 169.4 lb

## 2012-09-23 DIAGNOSIS — K219 Gastro-esophageal reflux disease without esophagitis: Secondary | ICD-10-CM

## 2012-09-23 DIAGNOSIS — Z860100 Personal history of colon polyps, unspecified: Secondary | ICD-10-CM

## 2012-09-23 DIAGNOSIS — Z1211 Encounter for screening for malignant neoplasm of colon: Secondary | ICD-10-CM

## 2012-09-23 MED ORDER — MOVIPREP 100 G PO SOLR: 1.0000 | Freq: Once | ORAL | Status: DC

## 2012-09-23 NOTE — Progress Notes (Signed)
Patient denies allergies to eggs or soy products. Denies any complications with anesthesia or sedation.

## 2012-10-09 ENCOUNTER — Encounter: Payer: Self-pay | Admitting: Internal Medicine

## 2012-10-09 ENCOUNTER — Ambulatory Visit (AMBULATORY_SURGERY_CENTER): Payer: Medicare Other | Admitting: Internal Medicine

## 2012-10-09 VITALS — BP 115/59 | HR 47 | Temp 96.9°F | Resp 17 | Ht 67.0 in | Wt 169.0 lb

## 2012-10-09 DIAGNOSIS — K209 Esophagitis, unspecified without bleeding: Secondary | ICD-10-CM

## 2012-10-09 DIAGNOSIS — D131 Benign neoplasm of stomach: Secondary | ICD-10-CM

## 2012-10-09 DIAGNOSIS — K219 Gastro-esophageal reflux disease without esophagitis: Secondary | ICD-10-CM

## 2012-10-09 DIAGNOSIS — Z1211 Encounter for screening for malignant neoplasm of colon: Secondary | ICD-10-CM

## 2012-10-09 DIAGNOSIS — Z86010 Personal history of colonic polyps: Secondary | ICD-10-CM

## 2012-10-09 MED ORDER — SODIUM CHLORIDE 0.9 % IV SOLN: 500.0000 mL | INTRAVENOUS | Status: DC

## 2012-10-09 MED ORDER — OMEPRAZOLE 40 MG PO CPDR: 40.0000 mg | DELAYED_RELEASE_CAPSULE | Freq: Two times a day (BID) | ORAL | Status: DC

## 2012-10-09 NOTE — Progress Notes (Signed)
Patient did not experience any of the following events: a burn prior to discharge; a fall within the facility; wrong site/side/patient/procedure/implant event; or a hospital transfer or hospital admission upon discharge from the facility. (G8907) Patient did not have preoperative order for IV antibiotic SSI prophylaxis. (G8918)  

## 2012-10-09 NOTE — Op Note (Signed)
[facility] [address] [place], [address]   COLONOSCOPY PROCEDURE REPORT  PATIENT: Edwin Ramirez, Edwin Ramirez.  MR#: [id number] BIRTHDATE: Oct 22, 1940 , 71  yrs. old GENDER: Male ENDOSCOPIST: Hart Carwin, MD REFERRED BY:  Marga Melnick, M.D. PROCEDURE DATE:  10/09/2012 PROCEDURE:   Colonoscopy, screening ASA CLASS:   Class II INDICATIONS:Patient's personal history of adenomatous colon polyps.  MEDICATIONS: propofol (Diprivan) 150mg  IV  DESCRIPTION OF PROCEDURE:   After the risks and benefits and of the procedure were explained, informed consent was obtained.  A digital rectal exam revealed no abnormalities of the rectum.    The LB PCF-H180AL [id number]  endoscope was introduced through the anus and advanced to the cecum, which was identified by both the appendix and ileocecal valve .  The quality of the prep was good, using MoviPrep .  The instrument was then slowly withdrawn as the colon was fully examined.     COLON FINDINGS: Mild diverticulosis was noted in the descending colon.     Retroflexed views revealed no abnormalities.     The scope was then withdrawn from the patient and the procedure completed.  COMPLICATIONS: There were no complications. ENDOSCOPIC IMPRESSION: Mild diverticulosis was noted in the descending colon  RECOMMENDATIONS: High fiber diet   REPEAT EXAM: In 10 year(s)  for Colonoscopy.  cc:  _______________________________ eSignedHart Carwin, MD 10/09/2012 11:12 AM

## 2012-10-09 NOTE — Patient Instructions (Addendum)
Impressions/Recommendations:  Hiatal hernia (handout given) Esophagitis (handout given) Gastritis (handout given)  Diverticulosis (handout given) High Fiber Diet (handout given)  YOU HAD AN ENDOSCOPIC PROCEDURE TODAY AT THE [facility]: Refer to the procedure report that was given to you for any specific questions about what was found during the examination.  If the procedure report does not answer your questions, please call your gastroenterologist to clarify.  If you requested that your care partner not be given the details of your procedure findings, then the procedure report has been included in a sealed envelope for you to review at your convenience later.  YOU SHOULD EXPECT: Some feelings of bloating in the abdomen. Passage of more gas than usual.  Walking can help get rid of the air that was put into your GI tract during the procedure and reduce the bloating. If you had a lower endoscopy (such as a colonoscopy or flexible sigmoidoscopy) you may notice spotting of blood in your stool or on the toilet paper. If you underwent a bowel prep for your procedure, then you may not have a normal bowel movement for a few days.  DIET: Your first meal following the procedure should be a light meal and then it is ok to progress to your normal diet.  A half-sandwich or bowl of soup is an example of a good first meal.  Heavy or fried foods are harder to digest and may make you feel nauseous or bloated.  Likewise meals heavy in dairy and vegetables can cause extra gas to form and this can also increase the bloating.  Drink plenty of fluids but you should avoid alcoholic beverages for 24 hours.  ACTIVITY: Your care partner should take you home directly after the procedure.  You should plan to take it easy, moving slowly for the rest of the day.  You can resume normal activity the day after the procedure however you should NOT DRIVE or use heavy machinery for 24 hours (because of the sedation  medicines used during the test).    SYMPTOMS TO REPORT IMMEDIATELY: A gastroenterologist can be reached at any hour.  During normal business hours, 8:30 AM to 5:00 PM Monday through Friday, call [phone].  After hours and on weekends, please call the GI answering service at [phone] who will take a message and have the physician on call contact you.   Following lower endoscopy (colonoscopy or flexible sigmoidoscopy):  Excessive amounts of blood in the stool  Significant tenderness or worsening of abdominal pains  Swelling of the abdomen that is new, acute  Fever of 100F or higher  Following upper endoscopy (EGD)  Vomiting of blood or coffee ground material  New chest pain or pain under the shoulder blades  Painful or persistently difficult swallowing  New shortness of breath  Fever of 100F or higher  Black, tarry-looking stools  FOLLOW UP: If any biopsies were taken you will be contacted by phone or by letter within the next 1-3 weeks.  Call your gastroenterologist if you have not heard about the biopsies in 3 weeks.  Our staff will call the home number listed on your records the next business day following your procedure to check on you and address any questions or concerns that you may have at that time regarding the information given to you following your procedure. This is a courtesy call and so if there is no answer at the home number and we have not heard from you through the emergency physician  on call, we will assume that you have returned to your regular daily activities without incident.  SIGNATURES/CONFIDENTIALITY: You and/or your care partner have signed paperwork which will be entered into your electronic medical record.  These signatures attest to the fact that that the information above on your After Visit Summary has been reviewed and is understood.  Full responsibility of the confidentiality of this discharge information lies with you and/or your  care-partner.

## 2012-10-09 NOTE — Progress Notes (Signed)
Report to pacu rn, vss, bbs=clear 

## 2012-10-09 NOTE — Progress Notes (Signed)
Called to room to assist during endoscopic procedure.  Patient ID and intended procedure confirmed with present staff. Received instructions for my participation in the procedure from the performing physician.  

## 2012-10-09 NOTE — Progress Notes (Signed)
Patient requesting refill of Prilosec today.

## 2012-10-09 NOTE — Op Note (Signed)
[facility] [address] [place], [address]   ENDOSCOPY PROCEDURE REPORT  PATIENT: [name], [name].  MR#: [id number] BIRTHDATE: Sep 23, 1940 , 71  yrs. old GENDER: Male ENDOSCOPIST: Hart Carwin, MD REFERRED BY:  Dr Lacretia Nicks.Hopper PROCEDURE DATE:  10/09/2012 PROCEDURE:  EGD w/ biopsy ASA CLASS:     Class II INDICATIONS:  Heartburn.   History of esophageal reflux.   Therapy of esophageal reflux.   refractory to prilosec 20 mg qd-bid. MEDICATIONS: MAC sedation, administered by CRNA and propofol (Diprivan) 150mg  IV TOPICAL ANESTHETIC: Cetacaine Spray  DESCRIPTION OF PROCEDURE: After the risks benefits and alternatives of the procedure were thoroughly explained, informed consent was obtained.  The LB GIF-H180 [id number] endoscope was introduced through the mouth and advanced to the second portion of the duodenum. Without limitations.  The instrument was slowly withdrawn as the mucosa was fully examined.      Esophagus: endoscope passed through the posterior pharynx into the esophagus under direct vision.. Esophageal mucosa appeared normal through the proximal ,mid and distal esophagus. There were 2 short erosions at the GE junction and there was increased and fibrous tissue at the GE junction suggestive of  nonobstructing esophageal stricture. Endoscope traversed without resistance  Stomach:  the stomach was insufflated with air and showed  small reducible hiatal hernia which measured 1-2 cm. Gastric body was normal there, were a few patches of erythema in gastric antrum which were biopsied to rule out H. pylori. Gastric outlet was normal. Endoscope was retroflexed and the fundus and cardia were visualized and appeared normal  Duodenum duodenal bulb and descending duodenum was normal. The endoscope was then retracted and the biopsies were taken from GE junction to rule out Barrett's esophagus[          The scope was then withdrawn from the patient and the  procedure completed.  COMPLICATIONS: There were no complications. ENDOSCOPIC IMPRESSION: small hiatal hernia which is reducible. Measures 1-2 cmGrade 1 esophagitis. Status post biopsies to rule out Barrett's esophagus Minimal antral gastritis. Status post biopsies to rule out H. pyloric RECOMMENDATIONS: 1.  Await pathology results 2.  Anti-reflux regimen to be follow 3  .Increase Prilosec to 40 mg once a day and possibly twice a day as needed for control of reflux symptoms  REPEAT EXAM: no recall unless barrett's esophagus  eSigned:  Hart Carwin, MD 10/09/2012 11:07 AM   CC:  PATIENT NAME:  [name]. MR#: [id number]

## 2012-10-12 ENCOUNTER — Telehealth: Payer: Self-pay | Admitting: *Deleted

## 2012-10-12 NOTE — Telephone Encounter (Signed)
  Follow up Call-  Call back number 10/09/2012  Post procedure Call Back phone  # [phone]  Permission to leave phone message Yes    No answer,left message.

## 2012-10-13 ENCOUNTER — Encounter: Payer: Self-pay | Admitting: Internal Medicine

## 2012-10-15 ENCOUNTER — Other Ambulatory Visit: Payer: Self-pay | Admitting: Internal Medicine

## 2012-10-15 NOTE — Telephone Encounter (Signed)
Med filled.  

## 2012-11-05 ENCOUNTER — Other Ambulatory Visit: Payer: Self-pay | Admitting: Dermatology

## 2013-02-23 ENCOUNTER — Ambulatory Visit (INDEPENDENT_AMBULATORY_CARE_PROVIDER_SITE_OTHER): Payer: Medicare Other | Admitting: Internal Medicine

## 2013-02-23 ENCOUNTER — Encounter: Payer: Self-pay | Admitting: Internal Medicine

## 2013-02-23 VITALS — BP 113/71 | HR 60 | Temp 97.9°F | Ht 66.6 in | Wt 165.6 lb

## 2013-02-23 DIAGNOSIS — E782 Mixed hyperlipidemia: Secondary | ICD-10-CM

## 2013-02-23 DIAGNOSIS — Z8546 Personal history of malignant neoplasm of prostate: Secondary | ICD-10-CM

## 2013-02-23 DIAGNOSIS — Z0000 Encounter for general adult medical examination without abnormal findings: Secondary | ICD-10-CM

## 2013-02-23 DIAGNOSIS — Z86010 Personal history of colonic polyps: Secondary | ICD-10-CM

## 2013-02-23 LAB — LIPID PANEL
Cholesterol: 142 mg/dL (ref 0–200)
LDL Cholesterol: 78 mg/dL (ref 0–99)
Triglycerides: 139 mg/dL (ref 0.0–149.0)
VLDL: 27.8 mg/dL (ref 0.0–40.0)

## 2013-02-23 LAB — BASIC METABOLIC PANEL
BUN: 13 mg/dL (ref 6–23)
GFR: 71.73 mL/min (ref >60.00)
Glucose, Bld: 98 mg/dL (ref 70–99)
Potassium: 4 mEq/L (ref 3.5–5.1)

## 2013-02-23 LAB — CBC WITH DIFFERENTIAL/PLATELET
Basophils Absolute: 0 10*3/uL (ref 0.0–0.1)
Eosinophils Absolute: 0.1 10*3/uL (ref 0.0–0.7)
HCT: 43.4 % (ref 39.0–52.0)
Lymphs Abs: 1.3 10*3/uL (ref 0.7–4.0)
MCV: 95.3 fl (ref 78.0–100.0)
Monocytes Absolute: 0.4 10*3/uL (ref 0.1–1.0)
Monocytes Relative: 9.4 % (ref 3.0–12.0)
Platelets: 161 10*3/uL (ref 150.0–400.0)
RDW: 12.9 % (ref 11.5–14.6)

## 2013-02-23 LAB — HEPATIC FUNCTION PANEL: Total Bilirubin: 1.6 mg/dL — ABNORMAL HIGH (ref 0.3–1.2)

## 2013-02-23 MED ORDER — ROSUVASTATIN CALCIUM 20 MG PO TABS: 20.0000 mg | ORAL_TABLET | Freq: Every day | ORAL | Status: DC

## 2013-02-23 MED ORDER — LORAZEPAM 1 MG PO TABS: ORAL_TABLET | ORAL | Status: DC

## 2013-02-23 NOTE — Patient Instructions (Addendum)
Share results with all non [facility] medical staff seen  

## 2013-02-23 NOTE — Progress Notes (Signed)
Subjective:    Patient ID: Edwin Ramirez, male    DOB: Dec 23, 1940, 72 y.o.   MRN: [id number]  HPI Medicare Wellness Visit:  Psychosocial & medical history were reviewed as required by Medicare (abuse,antisocial behavioral risks,firearm risk).  Social history: caffeine: 3 cups coffee/ day , alcohol: 5 glasses / week  ,  tobacco use:  never  Exercise :  See below Home & personal  safety / fall risk:no Limitations of activities of daily living:no Seatbelt  and smoke alarm use:yes Power of Attorney/Living Will status : in place Ophthalmology exam status : current Hearing evaluation status:current Orientation :oriented X 3 Memory & recall :good Spelling  testing:good Active depression / anxiety:denied Foreign travel history :  [place] 2012 Immunization status for Shingles /Flu/ PNA/ tetanus : Flu due Transfusion history:  no Preventive health surveillance status of colonoscopy as per protocol/ SOC: Dental care:  Every 6 mos Chart reviewed &  Updated. Active issues reviewed & addressed.      Review of Systems He is on a heart healthy diet; he exercises as walking 3 mpd 5 times per week  without symptoms. Specifically he denies chest pain, palpitations, dyspnea, or claudication.  Family history is negative for premature coronary disease. Advanced cholesterol testing reveals his LDL goal is less than 100. There is medication compliance with the statin.  Significant abdominal symptoms, memory deficit, or myalgias denied.     Objective:   Physical Exam Gen.: Healthy and well-nourished in appearance. Alert, appropriate and cooperative throughout exam.Appears younger than stated age  Head: Normocephalic without obvious abnormalities;moustache  Eyes: No corneal or conjunctival inflammation noted. Pupils equal round reactive to light and accommodation. Extraocular motion intact. Vision grossly normal with lenses Ears: External  ear exam reveals no significant lesions or deformities.  Hearing aids bilaterally. Nose: External nasal exam reveals no deformity or inflammation. Nasal mucosa are pink and moist. No lesions or exudates noted. Mouth: Oral mucosa and oropharynx reveal no lesions or exudates. Teeth in good repair. Neck: No deformities, masses, or tenderness noted. Range of motion slightly decreased. Thyroid normal. Lungs: Normal respiratory effort; chest expands symmetrically. Lungs are clear to auscultation without rales, wheezes, or increased work of breathing. Heart: Normal rate and rhythm. Accentuated S1 ; normal S2. No gallop, click, or rub. No murmur. Abdomen: Bowel sounds normal; abdomen soft and nontender. No masses, organomegaly or hernias noted. Genitalia: S/P TURP                             Musculoskeletal/extremities: No deformity or scoliosis noted of  the thoracic or lumbar spine.  No clubbing, cyanosis, edema, or significant extremity  deformity noted. Range of motion normal .Tone & strength  Normal. Joints normal . Nail health good. Able to lie down & sit up w/o help. Negative SLR bilaterally Vascular: Carotid, radial artery, dorsalis pedis and  posterior tibial pulses are full and equal. No bruits present. Neurologic: Alert and oriented x3. Deep tendon reflexes symmetrical and normal.      Skin: Intact without suspicious lesions or rashes. Lymph: No cervical, axillary lymphadenopathy present. Psych: Mood and affect are normal. Normally interactive  Assessment & Plan:  #1 Medicare Wellness Exam; criteria met ; data entered #2 Problem List/Diagnoses reviewed Plan:  Assessments made/ Orders entered  

## 2013-03-24 ENCOUNTER — Other Ambulatory Visit: Payer: Self-pay | Admitting: Dermatology

## 2013-03-25 ENCOUNTER — Ambulatory Visit (INDEPENDENT_AMBULATORY_CARE_PROVIDER_SITE_OTHER): Payer: Medicare Other

## 2013-03-25 DIAGNOSIS — Z23 Encounter for immunization: Secondary | ICD-10-CM

## 2013-03-28 ENCOUNTER — Encounter: Payer: Self-pay | Admitting: Internal Medicine

## 2013-04-21 ENCOUNTER — Other Ambulatory Visit: Payer: Self-pay | Admitting: *Deleted

## 2013-04-21 MED ORDER — ROSUVASTATIN CALCIUM 20 MG PO TABS: 20.0000 mg | ORAL_TABLET | Freq: Every day | ORAL | Status: DC

## 2013-04-21 NOTE — Telephone Encounter (Signed)
rosuvastatin refilled

## 2013-06-23 ENCOUNTER — Encounter: Payer: Self-pay | Admitting: Internal Medicine

## 2013-06-23 ENCOUNTER — Ambulatory Visit (INDEPENDENT_AMBULATORY_CARE_PROVIDER_SITE_OTHER): Payer: Medicare Other | Admitting: Internal Medicine

## 2013-06-23 VITALS — BP 121/67 | HR 60 | Temp 97.9°F | Resp 13 | Wt 173.4 lb

## 2013-06-23 DIAGNOSIS — M25519 Pain in unspecified shoulder: Secondary | ICD-10-CM

## 2013-06-23 NOTE — Progress Notes (Signed)
Pre visit review using our clinic review tool, if applicable. No additional management support is needed unless otherwise documented below in the visit note. 

## 2013-06-23 NOTE — Progress Notes (Signed)
   Subjective:    Patient ID: Edwin Ramirez, male    DOB: 1941-01-29, 73 y.o.   MRN: [id number]  HPI    Pain in L shoulder s/p raking leaves x 1 hour in November. Pain improved with ice. No medications taken for pain. Patient is left-handed and has difficulty with ADLs such as combing hair, lifting L arm .  He has history of rotator cuff surgery twice on the left.  Past medical history of tenosynovitis of the thumbs   Review of Systems   He has no numbness, tingling, weakness in the upper extremities.  There's been no rash or change in color or temperature of the area pain       Objective:   Physical Exam Gen.: nourished in appearance. Alert, appropriate and cooperative throughout exam. Appears younger than stated age  Head: Normocephalic without obvious abnormalities Neck: No deformities, masses, or tenderness noted. Range of motion good.                                  Musculoskeletal/extremities: No deformity or scoliosis noted of  the thoracic or lumbar spine. No scapular winging.  No pain in the upper extremity/shoulder to opposition if the arm is below the shoulder level. Left UE cannot be raised above the shoulder because of pain. Direct pressure over the crown does not elicit pain in the neck or shoulder area   No clubbing, cyanosis, edema, or significant extremity  deformity noted. Tone & strength normal. Hand joints normal   Vascular: Carotid & radial artery pulses are full and equal. No bruits present. Neurologic: Alert and oriented x3. Deep tendon reflexes symmetrical and normal.  Gait normal  . No cervical nerve deficit noted in hands.   Skin: Intact without suspicious lesions or rashes. Lymph: No cervical, axillary lymphadenopathy present. Psych: Mood and affect are normal. 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 No evidence of cervical nerve deficit.  See orders

## 2013-06-23 NOTE — Patient Instructions (Signed)
I recommend an Orthopedic consultation to determine optimal therapy 

## 2013-06-24 ENCOUNTER — Other Ambulatory Visit: Payer: Self-pay | Admitting: Internal Medicine

## 2013-06-25 NOTE — Telephone Encounter (Signed)
Script faxed. JG//CMA

## 2013-06-25 NOTE — Telephone Encounter (Signed)
traMADol (ULTRAM) 50 MG tablet Last refill: 01/17/2012 #30, 2 refills Last OV: 06/23/2013 No contract on file

## 2013-06-25 NOTE — Telephone Encounter (Signed)
OK X1 

## 2013-07-26 ENCOUNTER — Other Ambulatory Visit: Payer: Self-pay | Admitting: Orthopedic Surgery

## 2013-07-26 DIAGNOSIS — M25512 Pain in left shoulder: Secondary | ICD-10-CM

## 2013-07-31 ENCOUNTER — Other Ambulatory Visit: Payer: Medicare Other

## 2013-08-16 ENCOUNTER — Ambulatory Visit
Admission: RE | Admit: 2013-08-16 | Discharge: 2013-08-16 | Disposition: A | Discharge status: Home / Self Care | Payer: Medicare Other | Source: Ambulatory Visit | Attending: Orthopedic Surgery | Admitting: Orthopedic Surgery

## 2013-08-16 DIAGNOSIS — M25512 Pain in left shoulder: Secondary | ICD-10-CM

## 2013-09-13 ENCOUNTER — Telehealth: Payer: Self-pay | Admitting: Internal Medicine

## 2013-09-13 NOTE — Telephone Encounter (Signed)
I have started prior authorization on the phone with Colletta [place] at [facility] for omeprazole. Information has been sent for clinical review and answer will be provided to [place] via fax.

## 2013-09-21 ENCOUNTER — Other Ambulatory Visit: Payer: Self-pay | Admitting: Internal Medicine

## 2013-09-21 NOTE — Telephone Encounter (Signed)
Last seen 06/23/13 Med last filled #30 +2 refills

## 2013-09-21 NOTE — Telephone Encounter (Signed)
Lorazepam has been called to pharmacy  

## 2013-09-27 ENCOUNTER — Other Ambulatory Visit: Payer: Self-pay

## 2013-09-27 MED ORDER — ROSUVASTATIN CALCIUM 20 MG PO TABS: 20.0000 mg | ORAL_TABLET | Freq: Every day | ORAL | Status: DC

## 2013-12-29 ENCOUNTER — Other Ambulatory Visit: Payer: Self-pay | Admitting: *Deleted

## 2013-12-29 MED ORDER — OMEPRAZOLE 40 MG PO CPDR: 40.0000 mg | DELAYED_RELEASE_CAPSULE | Freq: Two times a day (BID) | ORAL | Status: DC

## 2014-01-13 ENCOUNTER — Other Ambulatory Visit: Payer: Self-pay | Admitting: Dermatology

## 2014-01-21 ENCOUNTER — Encounter: Payer: Self-pay | Admitting: Internal Medicine

## 2014-02-07 ENCOUNTER — Other Ambulatory Visit: Payer: Self-pay | Admitting: Internal Medicine

## 2014-02-07 NOTE — Telephone Encounter (Signed)
06/23/13 last office visit

## 2014-02-07 NOTE — Telephone Encounter (Signed)
#  30 prn as written

## 2014-02-25 ENCOUNTER — Other Ambulatory Visit (INDEPENDENT_AMBULATORY_CARE_PROVIDER_SITE_OTHER): Payer: Medicare Other

## 2014-02-25 ENCOUNTER — Ambulatory Visit (INDEPENDENT_AMBULATORY_CARE_PROVIDER_SITE_OTHER): Payer: Medicare Other | Admitting: Internal Medicine

## 2014-02-25 ENCOUNTER — Encounter: Payer: Self-pay | Admitting: Internal Medicine

## 2014-02-25 VITALS — BP 104/70 | HR 51 | Temp 97.8°F | Resp 13 | Ht 67.0 in | Wt 166.1 lb

## 2014-02-25 DIAGNOSIS — Z23 Encounter for immunization: Secondary | ICD-10-CM

## 2014-02-25 DIAGNOSIS — K222 Esophageal obstruction: Secondary | ICD-10-CM

## 2014-02-25 DIAGNOSIS — Z8546 Personal history of malignant neoplasm of prostate: Secondary | ICD-10-CM

## 2014-02-25 DIAGNOSIS — Z86010 Personal history of colonic polyps: Secondary | ICD-10-CM

## 2014-02-25 DIAGNOSIS — E782 Mixed hyperlipidemia: Secondary | ICD-10-CM

## 2014-02-25 LAB — BASIC METABOLIC PANEL
BUN: 16 mg/dL (ref 6–23)
CALCIUM: 9.3 mg/dL (ref 8.4–10.5)
CO2: 28 meq/L (ref 19–32)
Chloride: 107 mEq/L (ref 96–112)
Creatinine, Ser: 1.1 mg/dL (ref 0.4–1.5)
GFR: 67.87 mL/min (ref >60.00)
Glucose, Bld: 107 mg/dL — ABNORMAL HIGH (ref 70–99)
Potassium: 4.7 mEq/L (ref 3.5–5.1)
SODIUM: 141 meq/L (ref 135–145)

## 2014-02-25 LAB — LIPID PANEL
Cholesterol: 127 mg/dL (ref 0–200)
HDL: 34 mg/dL — ABNORMAL LOW (ref >39.00)
LDL Cholesterol: 69 mg/dL (ref 0–99)
NonHDL: 93
Total CHOL/HDL Ratio: 4
Triglycerides: 118 mg/dL (ref 0.0–149.0)
VLDL: 23.6 mg/dL (ref 0.0–40.0)

## 2014-02-25 LAB — HEPATIC FUNCTION PANEL
ALBUMIN: 4.2 g/dL (ref 3.5–5.2)
ALK PHOS: 64 U/L (ref 39–117)
ALT: 22 U/L (ref 0–53)
AST: 25 U/L (ref 0–37)
Bilirubin, Direct: 0.2 mg/dL (ref 0.0–0.3)
TOTAL PROTEIN: 7.3 g/dL (ref 6.0–8.3)
Total Bilirubin: 1.2 mg/dL (ref 0.2–1.2)

## 2014-02-25 LAB — CBC WITH DIFFERENTIAL/PLATELET
BASOS PCT: 0.5 % (ref 0.0–3.0)
Basophils Absolute: 0 10*3/uL (ref 0.0–0.1)
EOS ABS: 0.1 10*3/uL (ref 0.0–0.7)
EOS PCT: 3.2 % (ref 0.0–5.0)
HCT: 43.6 % (ref 39.0–52.0)
HEMOGLOBIN: 14.6 g/dL (ref 13.0–17.0)
LYMPHS PCT: 26 % (ref 12.0–46.0)
Lymphs Abs: 1.2 10*3/uL (ref 0.7–4.0)
MCHC: 33.3 g/dL (ref 30.0–36.0)
MCV: 97.1 fl (ref 78.0–100.0)
Monocytes Absolute: 0.6 10*3/uL (ref 0.1–1.0)
Monocytes Relative: 12 % (ref 3.0–12.0)
Neutro Abs: 2.7 10*3/uL (ref 1.4–7.7)
Neutrophils Relative %: 58.3 % (ref 43.0–77.0)
Platelets: 147 10*3/uL — ABNORMAL LOW (ref 150.0–400.0)
RBC: 4.49 Mil/uL (ref 4.22–5.81)
RDW: 12.9 % (ref 11.5–15.5)
WBC: 4.7 10*3/uL (ref 4.0–10.5)

## 2014-02-25 LAB — TSH: TSH: 1.08 u[IU]/mL (ref 0.35–4.50)

## 2014-02-25 LAB — PSA: PSA: 0 ng/mL — AB (ref 0.10–4.00)

## 2014-02-25 NOTE — Patient Instructions (Signed)
Your next office appointment will be determined based upon review of your pending labs. Those instructions will be transmitted to you through My Chart . 

## 2014-02-25 NOTE — Progress Notes (Signed)
Pre visit review using our clinic review tool, if applicable. No additional management support is needed unless otherwise documented below in the visit note. 

## 2014-02-25 NOTE — Assessment & Plan Note (Signed)
CBC

## 2014-02-25 NOTE — Progress Notes (Signed)
   Subjective:    Patient ID: Edwin Ramirez, male    DOB: 1940/08/12, 73 y.o.   MRN: [id number]  HPI  He is here to assess active health issues & conditions. PMH, FH, & Social history verified & updated   He is on a modified heart healthy diet. He's been compliant with his Crestor but it costs $ 100 for 3 months and that  has put him in the donut hole.  He averages walking 3-6 mpd as per his [employer].  There is no premature heart attack in the family or stroke. LDL goal= < 100, ideally < 70. He denies any adverse effects from the statin.    Review of Systems Specifically denied are  chest pain, palpitations, dyspnea, or claudication.  Significant abdominal symptoms, memory deficit, or myalgias not present.  He has had a steroid injection for rotator cuff symptoms with good response.     Objective:   Physical Exam Gen.: Healthy and well-nourished in appearance. Alert, appropriate and cooperative throughout exam. Appears younger than stated age  Head: Normocephalic without obvious abnormalities;  pattern alopecia  Eyes: No corneal or conjunctival inflammation noted. Pupils equal round reactive to light and accommodation. Extraocular motion intact. Ears: External  ear exam reveals no significant lesions or deformities.Hearing aids bilaterally. Nose: External nasal exam reveals no deformity or inflammation. Nasal mucosa are pink and moist. No lesions or exudates noted.   Mouth: Oral mucosa and oropharynx reveal no lesions or exudates. Teeth in good repair. Neck: No deformities, masses, or tenderness noted. Range of motion & Thyroid normal Lungs: Normal respiratory effort; chest expands symmetrically. Lungs are clear to auscultation without rales, wheezes, or increased work of breathing. Heart: Normal rate and rhythm. Normal S1 and S2. No gallop, click, or rub.No murmur. Abdomen: Bowel sounds normal; abdomen soft and nontender. No masses, organomegaly or hernias noted. Genitalia:  PSA                                 Musculoskeletal/extremities: No deformity or scoliosis noted of  the thoracic or lumbar spine.  No clubbing, cyanosis, edema, or significant extremity  deformity noted. Range of motion normal .Tone & strength normal. Some lateral deviation of great toes. Hand joints normal  Fingernail / toenail health good. Able to lie down & sit up w/o help. Negative SLR bilaterally Vascular: Carotid, radial artery, femoral,dorsalis pedis and  posterior tibial pulses are equal. Decreased DPP.No bruits present. Neurologic: Alert and oriented x3. Deep tendon reflexes symmetrical and normal. Fine bilateral intention tremor  Gait normal .      Skin: Intact without suspicious lesions or rashes. Lymph: No cervical, axillary lymphadenopathy present. Psych: Mood and affect are normal. Normally interactive                                                                                        Assessment & Plan:  See Current Assessment & Plan in Problem List under specific Diagnosis

## 2014-02-25 NOTE — Assessment & Plan Note (Signed)
Lipids, LFTs, TSH  

## 2014-02-28 ENCOUNTER — Telehealth: Payer: Self-pay

## 2014-02-28 NOTE — Telephone Encounter (Signed)
Request for add on has been faxed to lab 

## 2014-02-28 NOTE — Telephone Encounter (Signed)
Message copied by Shelly Coss on Mon Feb 28, 2014  8:12 AM ------      Message from: Hendricks Limes      Created: Sat Feb 26, 2014  9:55 AM       Please add A1c (790.29)       ------

## 2014-03-01 ENCOUNTER — Telehealth: Payer: Self-pay

## 2014-03-01 DIAGNOSIS — R7309 Other abnormal glucose: Secondary | ICD-10-CM

## 2014-03-01 NOTE — Telephone Encounter (Signed)
Phone call from [place] at [facility] she states a1c can not be added on since the building had no power this weekend tubes could not be stored

## 2014-03-01 NOTE — Addendum Note (Signed)
Addended by: Roma Schanz R on: 03/01/2014 10:04 AM   Modules accepted: Orders

## 2014-03-01 NOTE — Telephone Encounter (Signed)
Patient advised to come back for a1c.   Avs left at front desk for him to pick up

## 2014-03-01 NOTE — Telephone Encounter (Signed)
Needs to come back for A1c (790.29)

## 2014-03-07 ENCOUNTER — Telehealth: Payer: Self-pay | Admitting: Internal Medicine

## 2014-03-07 ENCOUNTER — Other Ambulatory Visit (INDEPENDENT_AMBULATORY_CARE_PROVIDER_SITE_OTHER): Payer: Medicare Other

## 2014-03-07 DIAGNOSIS — R7309 Other abnormal glucose: Secondary | ICD-10-CM

## 2014-03-07 LAB — HEMOGLOBIN A1C: Hgb A1c MFr Bld: 5.7 % (ref 4.6–6.5)

## 2014-03-07 NOTE — Telephone Encounter (Signed)
error 

## 2014-03-17 ENCOUNTER — Ambulatory Visit (INDEPENDENT_AMBULATORY_CARE_PROVIDER_SITE_OTHER): Payer: Medicare Other

## 2014-03-17 DIAGNOSIS — Z23 Encounter for immunization: Secondary | ICD-10-CM

## 2014-03-18 ENCOUNTER — Telehealth: Payer: Self-pay

## 2014-03-18 MED ORDER — SIMVASTATIN 20 MG PO TABS: 20.0000 mg | ORAL_TABLET | Freq: Every day | ORAL | Status: DC

## 2014-03-18 NOTE — Telephone Encounter (Signed)
Crestor taken off medication list and Simvastatin added and electronically sent to [facility].

## 2014-03-18 NOTE — Telephone Encounter (Signed)
To replace Crestor due to cost #90

## 2014-03-18 NOTE — Telephone Encounter (Signed)
Received a refill request from [facility] for a refill on Simvastatin 20 mg 90 day supply. This is not on present med list.

## 2014-07-14 ENCOUNTER — Other Ambulatory Visit: Payer: Self-pay | Admitting: Dermatology

## 2014-07-18 ENCOUNTER — Other Ambulatory Visit: Payer: Self-pay | Admitting: *Deleted

## 2014-07-18 MED ORDER — SIMVASTATIN 20 MG PO TABS: 20.0000 mg | ORAL_TABLET | Freq: Every day | ORAL | Status: DC

## 2014-07-18 NOTE — Telephone Encounter (Signed)
Left msg on triage no longer using mail service. Requesting simvastatin to be sen to [facility]. Called pt back inform him med sent to [facility]...Johny Chess

## 2014-08-05 ENCOUNTER — Encounter: Payer: Self-pay | Admitting: Internal Medicine

## 2014-08-05 ENCOUNTER — Ambulatory Visit (INDEPENDENT_AMBULATORY_CARE_PROVIDER_SITE_OTHER): Payer: PPO | Admitting: Internal Medicine

## 2014-08-05 VITALS — BP 130/70 | HR 62 | Temp 97.6°F | Resp 15 | Ht 67.0 in | Wt 172.2 lb

## 2014-08-05 DIAGNOSIS — R1032 Left lower quadrant pain: Secondary | ICD-10-CM

## 2014-08-05 MED ORDER — TRAMADOL HCL 50 MG PO TABS: 50.0000 mg | ORAL_TABLET | Freq: Four times a day (QID) | ORAL | Status: DC | PRN

## 2014-08-05 NOTE — Progress Notes (Signed)
   Subjective:    Patient ID: Edwin Ramirez, male    DOB: 1941-06-04, 74 y.o.   MRN: [id number]  HPI  His pain began 08/02/14 in the left lateral abdominal area. It varies in intensity from 7-8. There's been some radiation into the inguinal area as well as to the back slightly. He did lift a carton of bottles 2/22 or 2/23 but has had no other triggers or injuries.  Tylenol was of minimal benefit. He's had some variation in his stools with some loose stool. Bowel movement today was normal.  He has a history of diverticulosis on colonoscopy & adenomatous polyps. He's never had diverticulitis.  He does have dyspepsia which is unrelated. He has no other active GI or GU symptoms  He has had a cholecystectomy, appendectomy, and prostatectomy.  Review of Systems   He did have a cough over the last 3 weeks; the cough resolved last weekend prior to the onset of the pain. He denies seeing any abdominal bulge to suggest hernia. Unexplained weight loss,  dysphagia, melena, rectal bleeding, or persistently small caliber stools are denied. Dysuria, pyuria, hematuria, frequency, nocturia or polyuria are denied.    Objective:   Physical Exam  Pertinent or positive findings include :  He has bilateral hearing aids. The light reflex from his eyes is quite striking. He's had bilateral cataract surgery.  He has slight tenderness over the left lateral abdomen to deep palpation.  General appearance :adequately nourished; in no distress. Eyes: No conjunctival inflammation or scleral icterus is present. Oral exam: Dental hygiene is good. Lips and gums are healthy appearing.There is no oropharyngeal erythema or exudate noted.  Heart:  Normal rate and regular rhythm. S1 and S2 normal without gallop, murmur, click, rub or other extra sounds   Lungs:Chest clear to auscultation; no wheezes, rhonchi,rales ,or rubs present.No increased work of breathing.  Abdomen: bowel sounds normal, soft  without masses,  organomegaly or hernias noted.  No guarding or rebound. No flank tenderness to percussion. Vascular : all pulses equal ; no bruits present. Skin:Warm & dry.  Intact without suspicious lesions or rashes ; no jaundice or tenting Lymphatic: No lymphadenopathy is noted about the head, neck, axilla, or inguinal areas.  Neuro: Strength, tone  normal. Genitourinary: Genitalia normal except for  varices in left scrotum..          Assessment & Plan:  #1 left abdominal pain; clinically he has abdominal wall pain rather than diverticulitis.  Plan: Topical heat; tramadol as needed. He will monitor for any significant bowel changes or fever, chills, or sweats.

## 2014-08-05 NOTE — Progress Notes (Signed)
Pre visit review using our clinic review tool, if applicable. No additional management support is needed unless otherwise documented below in the visit note. 

## 2014-08-05 NOTE — Patient Instructions (Signed)
Use a heating pad on very low heat to the area 2-3 times a day for 20 minutes. Avoid activities which initiate the discomfort

## 2014-08-10 ENCOUNTER — Other Ambulatory Visit: Payer: Self-pay | Admitting: Internal Medicine

## 2014-08-10 NOTE — Telephone Encounter (Signed)
OK x 1 

## 2014-08-10 NOTE — Telephone Encounter (Signed)
Lorazepam has been called to [facility]

## 2014-08-16 ENCOUNTER — Telehealth: Payer: Self-pay | Admitting: *Deleted

## 2014-08-16 MED ORDER — OMEPRAZOLE 40 MG PO CPDR: 40.0000 mg | DELAYED_RELEASE_CAPSULE | Freq: Two times a day (BID) | ORAL | Status: DC

## 2014-08-16 NOTE — Telephone Encounter (Signed)
Rx sent for Omeprazole, 40 mg, #180 with one refill to [facility].

## 2014-09-14 ENCOUNTER — Telehealth: Payer: Self-pay | Admitting: Internal Medicine

## 2014-09-14 NOTE — Telephone Encounter (Signed)
I'd like to discuss @ his next OV He can get forms on line

## 2014-09-14 NOTE — Telephone Encounter (Signed)
Left message for patient to call back  

## 2014-09-14 NOTE — Telephone Encounter (Signed)
Patient is requesting DNR.

## 2014-11-28 ENCOUNTER — Other Ambulatory Visit: Payer: Self-pay | Admitting: Internal Medicine

## 2014-11-28 NOTE — Telephone Encounter (Signed)
OK X1 

## 2014-11-28 NOTE — Telephone Encounter (Signed)
Please advise, thanks.

## 2015-03-01 ENCOUNTER — Encounter: Payer: Self-pay | Admitting: Internal Medicine

## 2015-03-01 ENCOUNTER — Other Ambulatory Visit (INDEPENDENT_AMBULATORY_CARE_PROVIDER_SITE_OTHER): Payer: PPO

## 2015-03-01 ENCOUNTER — Ambulatory Visit (INDEPENDENT_AMBULATORY_CARE_PROVIDER_SITE_OTHER): Payer: PPO | Admitting: Internal Medicine

## 2015-03-01 VITALS — BP 120/72 | HR 62 | Ht 67.0 in | Wt 164.0 lb

## 2015-03-01 DIAGNOSIS — Z8546 Personal history of malignant neoplasm of prostate: Secondary | ICD-10-CM

## 2015-03-01 DIAGNOSIS — R001 Bradycardia, unspecified: Secondary | ICD-10-CM

## 2015-03-01 DIAGNOSIS — Z23 Encounter for immunization: Secondary | ICD-10-CM | POA: Diagnosis not present

## 2015-03-01 DIAGNOSIS — Z0000 Encounter for general adult medical examination without abnormal findings: Secondary | ICD-10-CM

## 2015-03-01 DIAGNOSIS — E782 Mixed hyperlipidemia: Secondary | ICD-10-CM

## 2015-03-01 DIAGNOSIS — K219 Gastro-esophageal reflux disease without esophagitis: Secondary | ICD-10-CM | POA: Diagnosis not present

## 2015-03-01 LAB — PSA: PSA: 0 ng/mL — ABNORMAL LOW (ref 0.10–4.00)

## 2015-03-01 LAB — CBC WITH DIFFERENTIAL/PLATELET
BASOS ABS: 0 10*3/uL (ref 0.0–0.1)
BASOS PCT: 0.4 % (ref 0.0–3.0)
EOS ABS: 0.1 10*3/uL (ref 0.0–0.7)
Eosinophils Relative: 2.9 % (ref 0.0–5.0)
HEMATOCRIT: 45 % (ref 39.0–52.0)
Hemoglobin: 15 g/dL (ref 13.0–17.0)
LYMPHS PCT: 27.6 % (ref 12.0–46.0)
Lymphs Abs: 1.3 10*3/uL (ref 0.7–4.0)
MCHC: 33.3 g/dL (ref 30.0–36.0)
MCV: 96.1 fl (ref 78.0–100.0)
Monocytes Absolute: 0.5 10*3/uL (ref 0.1–1.0)
Monocytes Relative: 11 % (ref 3.0–12.0)
Neutro Abs: 2.8 10*3/uL (ref 1.4–7.7)
Neutrophils Relative %: 58.1 % (ref 43.0–77.0)
Platelets: 166 10*3/uL (ref 150.0–400.0)
RBC: 4.69 Mil/uL (ref 4.22–5.81)
RDW: 13.4 % (ref 11.5–15.5)
WBC: 4.9 10*3/uL (ref 4.0–10.5)

## 2015-03-01 LAB — HEPATIC FUNCTION PANEL
ALK PHOS: 63 U/L (ref 39–117)
ALT: 17 U/L (ref 0–53)
AST: 21 U/L (ref 0–37)
Albumin: 4.3 g/dL (ref 3.5–5.2)
BILIRUBIN DIRECT: 0.2 mg/dL (ref 0.0–0.3)
BILIRUBIN TOTAL: 1.4 mg/dL — AB (ref 0.2–1.2)
Total Protein: 6.9 g/dL (ref 6.0–8.3)

## 2015-03-01 LAB — URINALYSIS
Bilirubin Urine: NEGATIVE
HGB URINE DIPSTICK: NEGATIVE
Ketones, ur: NEGATIVE
Leukocytes, UA: NEGATIVE
NITRITE: NEGATIVE
Specific Gravity, Urine: 1.025 (ref 1.000–1.030)
Total Protein, Urine: NEGATIVE
UROBILINOGEN UA: 0.2 (ref 0.0–1.0)
Urine Glucose: NEGATIVE
pH: 5.5 (ref 5.0–8.0)

## 2015-03-01 LAB — BASIC METABOLIC PANEL
BUN: 13 mg/dL (ref 6–23)
CALCIUM: 9.1 mg/dL (ref 8.4–10.5)
CHLORIDE: 105 meq/L (ref 96–112)
CO2: 30 meq/L (ref 19–32)
Creatinine, Ser: 1.11 mg/dL (ref 0.40–1.50)
GFR: 68.39 mL/min (ref >60.00)
Glucose, Bld: 94 mg/dL (ref 70–99)
Potassium: 4 mEq/L (ref 3.5–5.1)
Sodium: 141 mEq/L (ref 135–145)

## 2015-03-01 LAB — LIPID PANEL
Cholesterol: 145 mg/dL (ref 0–200)
HDL: 34.2 mg/dL — ABNORMAL LOW (ref >39.00)
LDL CALC: 85 mg/dL (ref 0–99)
NonHDL: 111.19
Total CHOL/HDL Ratio: 4
Triglycerides: 129 mg/dL (ref 0.0–149.0)
VLDL: 25.8 mg/dL (ref 0.0–40.0)

## 2015-03-01 LAB — TSH: TSH: 1.88 u[IU]/mL (ref 0.35–4.50)

## 2015-03-01 MED ORDER — VITAMIN D3 50 MCG (2000 UT) PO CAPS: 2000.0000 [IU] | ORAL_CAPSULE | Freq: Every day | ORAL | Status: DC

## 2015-03-01 MED ORDER — SIMVASTATIN 20 MG PO TABS: 20.0000 mg | ORAL_TABLET | Freq: Every day | ORAL | Status: DC

## 2015-03-01 MED ORDER — LORAZEPAM 1 MG PO TABS: ORAL_TABLET | ORAL | Status: DC

## 2015-03-01 NOTE — Assessment & Plan Note (Signed)

## 2015-03-01 NOTE — Progress Notes (Signed)
Subjective:  Patient ID: Edwin Ramirez, male    DOB: 06-01-1941  Age: 74 y.o. MRN: [id number]  CC: No chief complaint on file.   HPI NUMAN ZYLSTRA presents for a well exam - former [facility] pt, L handed. F/u dyslipidemia. Colon nl 2014  Outpatient Prescriptions Prior to Visit  Medication Sig Dispense Refill  . latanoprost (XALATAN) 0.005 % ophthalmic solution Place 1 drop into both eyes at bedtime.     . Multiple Vitamin (MULTIVITAMIN) tablet Take 1 tablet by mouth daily.      . Omega-3 Fatty Acids (FISH OIL PO) Take 1 capsule by mouth daily.     Marland Kitchen omeprazole (PRILOSEC) 40 MG capsule Take 1 capsule (40 mg total) by mouth 2 (two) times daily. 180 capsule 1  . traMADol (ULTRAM) 50 MG tablet Take 1 tablet (50 mg total) by mouth every 6 (six) hours as needed for moderate pain. 30 tablet 0  . LORazepam (ATIVAN) 1 MG tablet TAKE 1/2 TABLET BY MOUTH EVERY 8 HOURS AS NEEDED 30 tablet 0  . simvastatin (ZOCOR) 20 MG tablet Take 1 tablet (20 mg total) by mouth at bedtime. 90 tablet 3   No facility-administered medications prior to visit.    ROS Review of Systems  Constitutional: Negative for appetite change, fatigue and unexpected weight change.  HENT: Negative for congestion, nosebleeds, sneezing, sore throat and trouble swallowing.   Eyes: Negative for itching and visual disturbance.  Respiratory: Negative for cough.   Cardiovascular: Negative for chest pain, palpitations and leg swelling.  Gastrointestinal: Negative for nausea, diarrhea, blood in stool and abdominal distention.  Genitourinary: Negative for frequency and hematuria.  Musculoskeletal: Negative for back pain, joint swelling, gait problem and neck pain.  Skin: Negative for rash.  Neurological: Negative for dizziness, tremors, speech difficulty and weakness.  Psychiatric/Behavioral: Negative for suicidal ideas, sleep disturbance, dysphoric mood and agitation. The patient is not nervous/anxious.     Objective:  BP  120/72 mmHg  Pulse 62  Ht 5\' 7"  (1.702 m)  Wt 164 lb (74.39 kg)  BMI 25.68 kg/m2  SpO2 95%  BP Readings from Last 3 Encounters:  03/01/15 120/72  08/05/14 130/70  02/25/14 104/70    Wt Readings from Last 3 Encounters:  03/01/15 164 lb (74.39 kg)  08/05/14 172 lb 4 oz (78.132 kg)  02/25/14 166 lb 2 oz (75.354 kg)    Physical Exam  Constitutional: He is oriented to person, place, and time. He appears well-developed and well-nourished. No distress.  HENT:  Head: Normocephalic and atraumatic.  Right Ear: External ear normal.  Left Ear: External ear normal.  Nose: Nose normal.  Mouth/Throat: Oropharynx is clear and moist. No oropharyngeal exudate.  Eyes: Conjunctivae and EOM are normal. Pupils are equal, round, and reactive to light. Right eye exhibits no discharge. Left eye exhibits no discharge. No scleral icterus.  Neck: Normal range of motion. Neck supple. No JVD present. No tracheal deviation present. No thyromegaly present.  Cardiovascular: Normal rate, regular rhythm, normal heart sounds and intact distal pulses.  Exam reveals no gallop and no friction rub.   No murmur heard. Pulmonary/Chest: Effort normal and breath sounds normal. No stridor. No respiratory distress. He has no wheezes. He has no rales. He exhibits no tenderness.  Abdominal: Soft. Bowel sounds are normal. He exhibits no distension and no mass. There is no tenderness. There is no rebound and no guarding.  Genitourinary: Guaiac negative stool. No penile tenderness.  Musculoskeletal: Normal range of motion. He exhibits  no edema or tenderness.  Lymphadenopathy:    He has no cervical adenopathy.  Neurological: He is alert and oriented to person, place, and time. He has normal reflexes. No cranial nerve deficit. He exhibits normal muscle tone. Coordination normal.  Skin: Skin is warm and dry. No rash noted. He is not diaphoretic. No erythema. No pallor.  Psychiatric: He has a normal mood and affect. His behavior is  normal. Judgment and thought content normal.  rectal deffered  Lab Results  Component Value Date   WBC 4.9 03/01/2015   HGB 15.0 03/01/2015   HCT 45.0 03/01/2015   PLT 166.0 03/01/2015   GLUCOSE 94 03/01/2015   CHOL 145 03/01/2015   TRIG 129.0 03/01/2015   HDL 34.20* 03/01/2015   LDLDIRECT 78.8 02/05/2011   LDLCALC 85 03/01/2015   ALT 17 03/01/2015   AST 21 03/01/2015   NA 141 03/01/2015   K 4.0 03/01/2015   CL 105 03/01/2015   CREATININE 1.11 03/01/2015   BUN 13 03/01/2015   CO2 30 03/01/2015   TSH 1.88 03/01/2015   PSA 0.00* 03/01/2015   HGBA1C 5.7 03/07/2014   EKG S brady  Mr Shoulder Left Wo Contrast  08/16/2013   CLINICAL DATA:  Left shoulder pain. Status post left shoulder surgery and cuff repair in 1998 and in 2000.  EXAM: MRI OF THE LEFT SHOULDER WITHOUT CONTRAST  TECHNIQUE: Multiplanar, multisequence MR imaging of the shoulder was performed. No intravenous contrast was administered.  COMPARISON:  None.  FINDINGS: Rotator cuff: There is evidence of prior supraspinatus and infraspinatus cuff repair with susceptibility artifact resulting from orthopedic hardware. The subscapularis tendon and teres minor tendons are intact. There are intact fibers of the supraspinatus and infraspinatus tendons. There is relative attenuation at the junction of the supraspinatus and infraspinatus tendon peripherally concerning for a full-thickness tear.  Muscles: There is significant fatty atrophy of the infraspinatus muscle. The remainder of the rotator cuff muscles appear normal.  Biceps long head:  Intact.  Acromioclavicular Joint: Mild degenerative changes of the acromioclavicular joint. Type 2 acromion. No subacromial/subdeltoid bursal fluid.  Glenohumeral Joint: No joint effusion. Mild partial thickness cartilage loss of the humeral head and glenoid consistent with osteoarthritis.  Labrum: Grossly intact, but evaluation is limited by lack of intraarticular fluid.  Bones:  No focal marrow signal  abnormality.  IMPRESSION: 1. 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 The anterior fibers of the supraspinatus tendon and posterior fibers of the infraspinatus tendon are intact. Significant fatty atrophy of the infraspinatus muscle. 2. Intact subscapularis and teres minor tendons. 3. Mild osteoarthritis of the glenohumeral joint.   Electronically Signed   By: Kathreen Devoid   On: 08/16/2013 13:19    Assessment & Plan:   Diagnoses and all orders for this visit:  Well adult exam -     Basic metabolic panel; Future -     CBC with Differential/Platelet; Future -     Hepatic function panel; Future -     PSA; Future -     TSH; Future -     Urinalysis; Future -     Lipid panel; Future  Need for influenza vaccination -     Flu Vaccine QUAD 36+ mos IM -     Basic metabolic panel; Future -     CBC with Differential/Platelet; Future -     Hepatic function panel; Future -     PSA; Future -     TSH; Future -  Urinalysis; Future -     Lipid panel; Future  Bradycardia -     EKG 12-Lead -     Basic metabolic panel; Future -     CBC with Differential/Platelet; Future -     Hepatic function panel; Future -     PSA; Future -     TSH; Future -     Urinalysis; Future -     Lipid panel; Future  PROSTATE CANCER, HX OF -     Basic metabolic panel; Future -     CBC with Differential/Platelet; Future -     Hepatic function panel; Future -     PSA; Future -     TSH; Future -     Urinalysis; Future -     Lipid panel; Future  HYPERLIPIDEMIA -     Basic metabolic panel; Future -     CBC with Differential/Platelet; Future -     Hepatic function panel; Future -     PSA; Future -     TSH; Future -     Urinalysis; Future -     Lipid panel; Future  Gastroesophageal reflux disease without esophagitis -     Basic metabolic panel; Future -     CBC with Differential/Platelet; Future -     Hepatic function panel; Future -     PSA; Future -      TSH; Future -     Urinalysis; Future -     Lipid panel; Future  Other orders -     Cholecalciferol (VITAMIN D3) 2000 UNITS capsule; Take 1 capsule (2,000 Units total) by mouth daily. -     Discontinue: simvastatin (ZOCOR) 20 MG tablet; Take 1 tablet (20 mg total) by mouth at bedtime. -     LORazepam (ATIVAN) 1 MG tablet; TAKE 1/2 TABLET BY MOUTH EVERY 8 HOURS AS NEEDED -     simvastatin (ZOCOR) 20 MG tablet; Take 1 tablet (20 mg total) by mouth at bedtime.  I am having Mr. Hibler start on Vitamin D3. I am also having him maintain his multivitamin, latanoprost, Omega-3 Fatty Acids (FISH OIL PO), traMADol, omeprazole, Glucosamine-Chondroit-Vit C-Mn (GLUCOSAMINE 1500 COMPLEX PO), LORazepam, and simvastatin.  Meds ordered this encounter  Medications  . Glucosamine-Chondroit-Vit C-Mn (GLUCOSAMINE 1500 COMPLEX PO)    Sig: Take 2 capsules by mouth daily.  . Cholecalciferol (VITAMIN D3) 2000 UNITS capsule    Sig: Take 1 capsule (2,000 Units total) by mouth daily.    Dispense:  100 capsule    Refill:  3  . DISCONTD: simvastatin (ZOCOR) 20 MG tablet    Sig: Take 1 tablet (20 mg total) by mouth at bedtime.    Dispense:  90 tablet    Refill:  3  . LORazepam (ATIVAN) 1 MG tablet    Sig: TAKE 1/2 TABLET BY MOUTH EVERY 8 HOURS AS NEEDED    Dispense:  90 tablet    Refill:  1  . simvastatin (ZOCOR) 20 MG tablet    Sig: Take 1 tablet (20 mg total) by mouth at bedtime.    Dispense:  90 tablet    Refill:  3     Follow-up: Return in about 1 year (around 02/29/2016) for Wellness Exam.  Walker Kehr, MD

## 2015-03-01 NOTE — Assessment & Plan Note (Signed)
EGD 2014 Dr Olevia Perches On Prilosec

## 2015-03-01 NOTE — Progress Notes (Signed)
Pre visit review using our clinic review tool, if applicable. No additional management support is needed unless otherwise documented below in the visit note. 

## 2015-03-01 NOTE — Assessment & Plan Note (Signed)
Labs Simvastatin 

## 2015-03-01 NOTE — Patient Instructions (Signed)
Preventive Care for Adults A healthy lifestyle and preventive care can promote health and wellness. Preventive health guidelines for men include the following key practices:  A routine yearly physical is a good way to check with your health care provider about your health and preventative screening. It is a chance to share any concerns and updates on your health and to receive a thorough exam.  Visit your dentist for a routine exam and preventative care every 6 months. Brush your teeth twice a day and floss once a day. Good oral hygiene prevents tooth decay and gum disease.  The frequency of eye exams is based on your age, health, family medical history, use of contact lenses, and other factors. Follow your health care provider's recommendations for frequency of eye exams.  Eat a healthy diet. Foods such as vegetables, fruits, whole grains, low-fat dairy products, and lean protein foods contain the nutrients you need without too many calories. Decrease your intake of foods high in solid fats, added sugars, and salt. Eat the right amount of calories for you.Get information about a proper diet from your health care provider, if necessary.  Regular physical exercise is one of the most important things you can do for your health. Most adults should get at least 150 minutes of moderate-intensity exercise (any activity that increases your heart rate and causes you to sweat) each week. In addition, most adults need muscle-strengthening exercises on 2 or more days a week.  Maintain a healthy weight. The body mass index (BMI) is a screening tool to identify possible weight problems. It provides an estimate of body fat based on height and weight. Your health care provider can find your BMI and can help you achieve or maintain a healthy weight.For adults 20 years and older:  A BMI below 18.5 is considered underweight.  A BMI of 18.5 to 24.9 is normal.  A BMI of 25 to 29.9 is considered overweight.  A BMI  of 30 and above is considered obese.  Maintain normal blood lipids and cholesterol levels by exercising and minimizing your intake of saturated fat. Eat a balanced diet with plenty of fruit and vegetables. Blood tests for lipids and cholesterol should begin at age 50 and be repeated every 5 years. If your lipid or cholesterol levels are high, you are over 50, or you are at high risk for heart disease, you may need your cholesterol levels checked more frequently.Ongoing high lipid and cholesterol levels should be treated with medicines if diet and exercise are not working.  If you smoke, find out from your health care provider how to quit. If you do not use tobacco, do not start.  Lung cancer screening is recommended for adults aged 73-80 years who are at high risk for developing lung cancer because of a history of smoking. A yearly low-dose CT scan of the lungs is recommended for people who have at least a 30-pack-year history of smoking and are a current smoker or have quit within the past 15 years. A pack year of smoking is smoking an average of 1 pack of cigarettes a day for 1 year (for example: 1 pack a day for 30 years or 2 packs a day for 15 years). Yearly screening should continue until the smoker has stopped smoking for at least 15 years. Yearly screening should be stopped for people who develop a health problem that would prevent them from having lung cancer treatment.  If you choose to drink alcohol, do not have more than  2 drinks per day. One drink is considered to be 12 ounces (355 mL) of beer, 5 ounces (148 mL) of wine, or 1.5 ounces (44 mL) of liquor.  Avoid use of street drugs. Do not share needles with anyone. Ask for help if you need support or instructions about stopping the use of drugs.  High blood pressure causes heart disease and increases the risk of stroke. Your blood pressure should be checked at least every 1-2 years. Ongoing high blood pressure should be treated with  medicines, if weight loss and exercise are not effective.  If you are 45-79 years old, ask your health care provider if you should take aspirin to prevent heart disease.  Diabetes screening involves taking a blood sample to check your fasting blood sugar level. This should be done once every 3 years, after age 45, if you are within normal weight and without risk factors for diabetes. Testing should be considered at a younger age or be carried out more frequently if you are overweight and have at least 1 risk factor for diabetes.  Colorectal cancer can be detected and often prevented. Most routine colorectal cancer screening begins at the age of 50 and continues through age 75. However, your health care provider may recommend screening at an earlier age if you have risk factors for colon cancer. On a yearly basis, your health care provider may provide home test kits to check for hidden blood in the stool. Use of a small camera at the end of a tube to directly examine the colon (sigmoidoscopy or colonoscopy) can detect the earliest forms of colorectal cancer. Talk to your health care provider about this at age 50, when routine screening begins. Direct exam of the colon should be repeated every 5-10 years through age 75, unless early forms of precancerous polyps or small growths are found.  People who are at an increased risk for hepatitis B should be screened for this virus. You are considered at high risk for hepatitis B if:  You were born in a country where hepatitis B occurs often. Talk with your health care provider about which countries are considered high risk.  Your parents were born in a high-risk country and you have not received a shot to protect against hepatitis B (hepatitis B vaccine).  You have HIV or AIDS.  You use needles to inject street drugs.  You live with, or have sex with, someone who has hepatitis B.  You are a man who has sex with other men (MSM).  You get hemodialysis  treatment.  You take certain medicines for conditions such as cancer, organ transplantation, and autoimmune conditions.  Hepatitis C blood testing is recommended for all people born from 1945 through 1965 and any individual with known risks for hepatitis C.  Practice safe sex. Use condoms and avoid high-risk sexual practices to reduce the spread of sexually transmitted infections (STIs). STIs include gonorrhea, chlamydia, syphilis, trichomonas, herpes, HPV, and human immunodeficiency virus (HIV). Herpes, HIV, and HPV are viral illnesses that have no cure. They can result in disability, cancer, and death.  If you are at risk of being infected with HIV, it is recommended that you take a prescription medicine daily to prevent HIV infection. This is called preexposure prophylaxis (PrEP). You are considered at risk if:  You are a man who has sex with other men (MSM) and have other risk factors.  You are a heterosexual man, are sexually active, and are at increased risk for HIV infection.    You take drugs by injection.  You are sexually active with a partner who has HIV.  Talk with your health care provider about whether you are at high risk of being infected with HIV. If you choose to begin PrEP, you should first be tested for HIV. You should then be tested every 3 months for as long as you are taking PrEP.  A one-time screening for abdominal aortic aneurysm (AAA) and surgical repair of large AAAs by ultrasound are recommended for men ages 32 to 67 years who are current or former smokers.  Healthy men should no longer receive prostate-specific antigen (PSA) blood tests as part of routine cancer screening. Talk with your health care provider about prostate cancer screening.  Testicular cancer screening is not recommended for adult males who have no symptoms. Screening includes self-exam, a health care provider exam, and other screening tests. Consult with your health care provider about any symptoms  you have or any concerns you have about testicular cancer.  Use sunscreen. Apply sunscreen liberally and repeatedly throughout the day. You should seek shade when your shadow is shorter than you. Protect yourself by wearing long sleeves, pants, a wide-brimmed hat, and sunglasses year round, whenever you are outdoors.  Once a month, do a whole-body skin exam, using a mirror to look at the skin on your back. Tell your health care provider about new moles, moles that have irregular borders, moles that are larger than a pencil eraser, or moles that have changed in shape or color.  Stay current with required vaccines (immunizations).  Influenza vaccine. All adults should be immunized every year.  Tetanus, diphtheria, and acellular pertussis (Td, Tdap) vaccine. An adult who has not previously received Tdap or who does not know his vaccine status should receive 1 dose of Tdap. This initial dose should be followed by tetanus and diphtheria toxoids (Td) booster doses every 10 years. Adults with an unknown or incomplete history of completing a 3-dose immunization series with Td-containing vaccines should begin or complete a primary immunization series including a Tdap dose. Adults should receive a Td booster every 10 years.  Varicella vaccine. An adult without evidence of immunity to varicella should receive 2 doses or a second dose if he has previously received 1 dose.  Human papillomavirus (HPV) vaccine. Males aged 68-21 years who have not received the vaccine previously should receive the 3-dose series. Males aged 22-26 years may be immunized. Immunization is recommended through the age of 6 years for any male who has sex with males and did not get any or all doses earlier. Immunization is recommended for any person with an immunocompromised condition through the age of 49 years if he did not get any or all doses earlier. During the 3-dose series, the second dose should be obtained 4-8 weeks after the first  dose. The third dose should be obtained 24 weeks after the first dose and 16 weeks after the second dose.  Zoster vaccine. One dose is recommended for adults aged 50 years or older unless certain conditions are present.  Measles, mumps, and rubella (MMR) vaccine. Adults born before [birth year] generally are considered immune to measles and mumps. Adults born in [birth year] or later should have 1 or more doses of MMR vaccine unless there is a contraindication to the vaccine or there is laboratory evidence of immunity to each of the three diseases. A routine second dose of MMR vaccine should be obtained at least 28 days after the first dose for students attending postsecondary  schools, health care workers, or international travelers. People who received inactivated measles vaccine or an unknown type of measles vaccine during 1963-1967 should receive 2 doses of MMR vaccine. People who received inactivated mumps vaccine or an unknown type of mumps vaccine before 1979 and are at high risk for mumps infection should consider immunization with 2 doses of MMR vaccine. Unvaccinated health care workers born before 1957 who lack laboratory evidence of measles, mumps, or rubella immunity or laboratory confirmation of disease should consider measles and mumps immunization with 2 doses of MMR vaccine or rubella immunization with 1 dose of MMR vaccine.  Pneumococcal 13-valent conjugate (PCV13) vaccine. When indicated, a person who is uncertain of his immunization history and has no record of immunization should receive the PCV13 vaccine. An adult aged 19 years or older who has certain medical conditions and has not been previously immunized should receive 1 dose of PCV13 vaccine. This PCV13 should be followed with a dose of pneumococcal polysaccharide (PPSV23) vaccine. The PPSV23 vaccine dose should be obtained at least 8 weeks after the dose of PCV13 vaccine. An adult aged 19 years or older who has certain medical conditions and  previously received 1 or more doses of PPSV23 vaccine should receive 1 dose of PCV13. The PCV13 vaccine dose should be obtained 1 or more years after the last PPSV23 vaccine dose.  Pneumococcal polysaccharide (PPSV23) vaccine. When PCV13 is also indicated, PCV13 should be obtained first. All adults aged 65 years and older should be immunized. An adult younger than age 65 years who has certain medical conditions should be immunized. Any person who resides in a nursing home or long-term care facility should be immunized. An adult smoker should be immunized. People with an immunocompromised condition and certain other conditions should receive both PCV13 and PPSV23 vaccines. People with human immunodeficiency virus (HIV) infection should be immunized as soon as possible after diagnosis. Immunization during chemotherapy or radiation therapy should be avoided. Routine use of PPSV23 vaccine is not recommended for American Indians, [place] Natives, or people younger than 65 years unless there are medical conditions that require PPSV23 vaccine. When indicated, people who have unknown immunization and have no record of immunization should receive PPSV23 vaccine. One-time revaccination 5 years after the first dose of PPSV23 is recommended for people aged 19-64 years who have chronic kidney failure, nephrotic syndrome, asplenia, or immunocompromised conditions. People who received 1-2 doses of PPSV23 before age 65 years should receive another dose of PPSV23 vaccine at age 65 years or later if at least 5 years have passed since the previous dose. Doses of PPSV23 are not needed for people immunized with PPSV23 at or after age 65 years.  Meningococcal vaccine. Adults with asplenia or persistent complement component deficiencies should receive 2 doses of quadrivalent meningococcal conjugate (MenACWY-D) vaccine. The doses should be obtained at least 2 months apart. Microbiologists working with certain meningococcal bacteria,  military recruits, people at risk during an outbreak, and people who travel to or live in countries with a high rate of meningitis should be immunized. A first-year college student up through age 21 years who is living in a residence hall should receive a dose if he did not receive a dose on or after his 16th birthday. Adults who have certain high-risk conditions should receive one or more doses of vaccine.  Hepatitis A vaccine. Adults who wish to be protected from this disease, have certain high-risk conditions, work with hepatitis A-infected animals, work in hepatitis A research labs, or   travel to or work in countries with a high rate of hepatitis A should be immunized. Adults who were previously unvaccinated and who anticipate close contact with an international adoptee during the first 60 days after arrival in the [place] from a country with a high rate of hepatitis A should be immunized.  Hepatitis B vaccine. Adults should be immunized if they wish to be protected from this disease, have certain high-risk conditions, may be exposed to blood or other infectious body fluids, are household contacts or sex partners of hepatitis B positive people, are clients or workers in certain care facilities, or travel to or work in countries with a high rate of hepatitis B.  Haemophilus influenzae type b (Hib) vaccine. A previously unvaccinated person with asplenia or sickle cell disease or having a scheduled splenectomy should receive 1 dose of Hib vaccine. Regardless of previous immunization, a recipient of a hematopoietic stem cell transplant should receive a 3-dose series 6-12 months after his successful transplant. Hib vaccine is not recommended for adults with HIV infection. Preventive Service / Frequency Ages 52 to 17  Blood pressure check.** / Every 1 to 2 years.  Lipid and cholesterol check.** / Every 5 years beginning at age 69.  Hepatitis C blood test.** / For any individual with known risks for  hepatitis C.  Skin self-exam. / Monthly.  Influenza vaccine. / Every year.  Tetanus, diphtheria, and acellular pertussis (Tdap, Td) vaccine.** / Consult your health care provider. 1 dose of Td every 10 years.  Varicella vaccine.** / Consult your health care provider.  HPV vaccine. / 3 doses over 6 months, if 72 or younger.  Measles, mumps, rubella (MMR) vaccine.** / You need at least 1 dose of MMR if you were born in 1957 or later. You may also need a second dose.  Pneumococcal 13-valent conjugate (PCV13) vaccine.** / Consult your health care provider.  Pneumococcal polysaccharide (PPSV23) vaccine.** / 1 to 2 doses if you smoke cigarettes or if you have certain conditions.  Meningococcal vaccine.** / 1 dose if you are age 35 to 60 years and a Market researcher living in a residence hall, or have one of several medical conditions. You may also need additional booster doses.  Hepatitis A vaccine.** / Consult your health care provider.  Hepatitis B vaccine.** / Consult your health care provider.  Haemophilus influenzae type b (Hib) vaccine.** / Consult your health care provider. Ages 35 to 8  Blood pressure check.** / Every 1 to 2 years.  Lipid and cholesterol check.** / Every 5 years beginning at age 57.  Lung cancer screening. / Every year if you are aged 44-80 years and have a 30-pack-year history of smoking and currently smoke or have quit within the past 15 years. Yearly screening is stopped once you have quit smoking for at least 15 years or develop a health problem that would prevent you from having lung cancer treatment.  Fecal occult blood test (FOBT) of stool. / Every year beginning at age 55 and continuing until age 73. You may not have to do this test if you get a colonoscopy every 10 years.  Flexible sigmoidoscopy** or colonoscopy.** / Every 5 years for a flexible sigmoidoscopy or every 10 years for a colonoscopy beginning at age 28 and continuing until age  1.  Hepatitis C blood test.** / For all people born from [birth year] through 1965 and any individual with known risks for hepatitis C.  Skin self-exam. / Monthly.  Influenza vaccine. / Every  year.  Tetanus, diphtheria, and acellular pertussis (Tdap/Td) vaccine.** / Consult your health care provider. 1 dose of Td every 10 years.  Varicella vaccine.** / Consult your health care provider.  Zoster vaccine.** / 1 dose for adults aged 53 years or older.  Measles, mumps, rubella (MMR) vaccine.** / You need at least 1 dose of MMR if you were born in 1957 or later. You may also need a second dose.  Pneumococcal 13-valent conjugate (PCV13) vaccine.** / Consult your health care provider.  Pneumococcal polysaccharide (PPSV23) vaccine.** / 1 to 2 doses if you smoke cigarettes or if you have certain conditions.  Meningococcal vaccine.** / Consult your health care provider.  Hepatitis A vaccine.** / Consult your health care provider.  Hepatitis B vaccine.** / Consult your health care provider.  Haemophilus influenzae type b (Hib) vaccine.** / Consult your health care provider. Ages 77 and over  Blood pressure check.** / Every 1 to 2 years.  Lipid and cholesterol check.**/ Every 5 years beginning at age 85.  Lung cancer screening. / Every year if you are aged 55-80 years and have a 30-pack-year history of smoking and currently smoke or have quit within the past 15 years. Yearly screening is stopped once you have quit smoking for at least 15 years or develop a health problem that would prevent you from having lung cancer treatment.  Fecal occult blood test (FOBT) of stool. / Every year beginning at age 33 and continuing until age 11. You may not have to do this test if you get a colonoscopy every 10 years.  Flexible sigmoidoscopy** or colonoscopy.** / Every 5 years for a flexible sigmoidoscopy or every 10 years for a colonoscopy beginning at age 28 and continuing until age 73.  Hepatitis C blood  test.** / For all people born from [birth year] through 1965 and any individual with known risks for hepatitis C.  Abdominal aortic aneurysm (AAA) screening.** / A one-time screening for ages 50 to 27 years who are current or former smokers.  Skin self-exam. / Monthly.  Influenza vaccine. / Every year.  Tetanus, diphtheria, and acellular pertussis (Tdap/Td) vaccine.** / 1 dose of Td every 10 years.  Varicella vaccine.** / Consult your health care provider.  Zoster vaccine.** / 1 dose for adults aged 34 years or older.  Pneumococcal 13-valent conjugate (PCV13) vaccine.** / Consult your health care provider.  Pneumococcal polysaccharide (PPSV23) vaccine.** / 1 dose for all adults aged 63 years and older.  Meningococcal vaccine.** / Consult your health care provider.  Hepatitis A vaccine.** / Consult your health care provider.  Hepatitis B vaccine.** / Consult your health care provider.  Haemophilus influenzae type b (Hib) vaccine.** / Consult your health care provider. **Family history and personal history of risk and conditions may change your health care provider's recommendations. Document Released: 07/23/2001 Document Revised: 06/01/2013 Document Reviewed: 10/22/2010 [facility] Patient Information 2015 [place], [place]. This information is not intended to replace advice given to you by your health care provider. Make sure you discuss any questions you have with your health care provider.

## 2015-03-01 NOTE — Assessment & Plan Note (Signed)
PSA

## 2015-04-27 ENCOUNTER — Other Ambulatory Visit: Payer: Self-pay | Admitting: Internal Medicine

## 2015-04-27 MED ORDER — OMEPRAZOLE 40 MG PO CPDR: 40.0000 mg | DELAYED_RELEASE_CAPSULE | Freq: Two times a day (BID) | ORAL | Status: DC

## 2015-04-27 NOTE — Telephone Encounter (Signed)
They protein pump inhibitor prescribed twice a day usually comes from a  gastroenterologist. Please verify the prescribing physician.  I don't know if Dr Oletha Blend refill at this level.

## 2015-04-27 NOTE — Telephone Encounter (Signed)
OK to fill this prescription with additional refills x11 Thank you!  

## 2015-04-27 NOTE — Telephone Encounter (Signed)
Patient requesting refill for omeprazole (PRILOSEC) 40 MG capsule [id number] He states he would like to decrease the dosage to 20 mg twice a day. Pharmacy is [facility]  Also, pt states Dr. Camila Li is his PCP now since Linus Orn is leaving.  Can you please verify with this and let me know and I will change his PCP in his chart

## 2015-04-27 NOTE — Telephone Encounter (Signed)
Dr. Olevia Perches has retired. I will send pt a message asking if he will be est with a new provider in GI.

## 2015-05-24 ENCOUNTER — Encounter: Payer: Self-pay | Admitting: Podiatry

## 2015-05-24 ENCOUNTER — Ambulatory Visit (INDEPENDENT_AMBULATORY_CARE_PROVIDER_SITE_OTHER): Payer: PPO | Admitting: Podiatry

## 2015-05-24 VITALS — BP 134/71 | HR 56 | Resp 16 | Ht 67.0 in | Wt 159.0 lb

## 2015-05-24 DIAGNOSIS — L600 Ingrowing nail: Secondary | ICD-10-CM

## 2015-05-24 NOTE — Progress Notes (Signed)
   Subjective:    Patient ID: Edwin Ramirez, male    DOB: 1940-08-22, 74 y.o.   MRN: [id number]  HPI Patient presents with a nail problem in their right foot 3rd toe-lateral, red, swelling. Pt has has used epsom salt with no relief; x4 months,.   Review of Systems  HENT: Positive for hearing loss.   Musculoskeletal: Positive for back pain.  Hematological: Bruises/bleeds easily.  All other systems reviewed and are negative.      Objective:   Physical Exam        Assessment & Plan:

## 2015-05-24 NOTE — Patient Instructions (Signed)

## 2015-05-25 NOTE — Progress Notes (Signed)
Subjective:     Patient ID: Edwin Ramirez, male   DOB: April 06, 1941, 74 y.o.   MRN: [id number]  HPI patient presents with severe discomfort and incurvation of the third nail lateral border right stating he's tried to trim it and soak it without relief and it's been present for several months   Review of Systems  All other systems reviewed and are negative.      Objective:   Physical Exam  Constitutional: He is oriented to person, place, and time.  Cardiovascular: Intact distal pulses.   Musculoskeletal: Normal range of motion.  Neurological: He is oriented to person, place, and time.  Skin: Skin is warm.  Nursing note and vitals reviewed.  neurovascular status intact muscle strength adequate range of motion within normal limits with incurvated third nail right lateral side that's painful when pressed with distal redness but no drainage noted currently good digital perfusion is noted and well oriented 3     Assessment:      ingrown right third nail lateral side that's painful when pressed    Plan:      H&P condition reviewed with patient and recommended permanent removal of the corner. At this time I infiltrated 60 mg Xylocaine Marcaine mixture remove the lateral corner exposed matrix and applied phenol 3 applications 30 seconds followed by alcohol lavaged and sterile dressing. Gave instructions on soaks and reappoint

## 2015-05-31 ENCOUNTER — Telehealth: Payer: Self-pay | Admitting: *Deleted

## 2015-05-31 NOTE — Telephone Encounter (Signed)
Called patient at [phone] (Home #) to check to see how they were doing from their ingrown toenail procedure that was performed on Wednesday, May 24, 2015. Pt's wife stated, "husband is doing fine".

## 2015-07-14 DIAGNOSIS — H04122 Dry eye syndrome of left lacrimal gland: Secondary | ICD-10-CM | POA: Diagnosis not present

## 2015-07-14 DIAGNOSIS — H02839 Dermatochalasis of unspecified eye, unspecified eyelid: Secondary | ICD-10-CM | POA: Diagnosis not present

## 2015-07-14 DIAGNOSIS — H409 Unspecified glaucoma: Secondary | ICD-10-CM | POA: Diagnosis not present

## 2015-07-14 DIAGNOSIS — Z961 Presence of intraocular lens: Secondary | ICD-10-CM | POA: Diagnosis not present

## 2015-07-14 DIAGNOSIS — H04121 Dry eye syndrome of right lacrimal gland: Secondary | ICD-10-CM | POA: Diagnosis not present

## 2015-07-20 DIAGNOSIS — L570 Actinic keratosis: Secondary | ICD-10-CM | POA: Diagnosis not present

## 2015-07-20 DIAGNOSIS — L821 Other seborrheic keratosis: Secondary | ICD-10-CM | POA: Diagnosis not present

## 2015-07-20 DIAGNOSIS — C44519 Basal cell carcinoma of skin of other part of trunk: Secondary | ICD-10-CM | POA: Diagnosis not present

## 2015-07-20 DIAGNOSIS — Z85828 Personal history of other malignant neoplasm of skin: Secondary | ICD-10-CM | POA: Diagnosis not present

## 2015-07-20 DIAGNOSIS — D1801 Hemangioma of skin and subcutaneous tissue: Secondary | ICD-10-CM | POA: Diagnosis not present

## 2015-07-20 DIAGNOSIS — D485 Neoplasm of uncertain behavior of skin: Secondary | ICD-10-CM | POA: Diagnosis not present

## 2015-10-26 ENCOUNTER — Other Ambulatory Visit: Payer: Self-pay | Admitting: Internal Medicine

## 2015-10-27 NOTE — Telephone Encounter (Signed)
Called refill back into pharmacy spoke w/Wayne gave md approval.../lmb

## 2016-01-06 ENCOUNTER — Emergency Department (HOSPITAL_COMMUNITY)
Admission: EM | Admit: 2016-01-06 | Discharge: 2016-01-06 | Disposition: A | Discharge status: Home / Self Care | Payer: PPO | Attending: Emergency Medicine | Admitting: Emergency Medicine

## 2016-01-06 ENCOUNTER — Encounter (HOSPITAL_COMMUNITY): Payer: Self-pay | Admitting: *Deleted

## 2016-01-06 DIAGNOSIS — T5991XA Toxic effect of unspecified gases, fumes and vapors, accidental (unintentional), initial encounter: Secondary | ICD-10-CM | POA: Diagnosis not present

## 2016-01-06 DIAGNOSIS — Y939 Activity, unspecified: Secondary | ICD-10-CM | POA: Diagnosis not present

## 2016-01-06 DIAGNOSIS — Y999 Unspecified external cause status: Secondary | ICD-10-CM | POA: Diagnosis not present

## 2016-01-06 DIAGNOSIS — X58XXXA Exposure to other specified factors, initial encounter: Secondary | ICD-10-CM | POA: Insufficient documentation

## 2016-01-06 DIAGNOSIS — Y929 Unspecified place or not applicable: Secondary | ICD-10-CM | POA: Insufficient documentation

## 2016-01-06 DIAGNOSIS — Z8546 Personal history of malignant neoplasm of prostate: Secondary | ICD-10-CM | POA: Diagnosis not present

## 2016-01-06 DIAGNOSIS — Z85828 Personal history of other malignant neoplasm of skin: Secondary | ICD-10-CM | POA: Insufficient documentation

## 2016-01-06 DIAGNOSIS — T304 Corrosion of unspecified body region, unspecified degree: Secondary | ICD-10-CM

## 2016-01-06 DIAGNOSIS — S00211A Abrasion of right eyelid and periocular area, initial encounter: Secondary | ICD-10-CM | POA: Diagnosis not present

## 2016-01-06 DIAGNOSIS — T2691XA Corrosion of right eye and adnexa, part unspecified, initial encounter: Secondary | ICD-10-CM | POA: Diagnosis not present

## 2016-01-06 DIAGNOSIS — H578 Other specified disorders of eye and adnexa: Secondary | ICD-10-CM | POA: Diagnosis not present

## 2016-01-06 MED ORDER — ERYTHROMYCIN 5 MG/GM OP OINT: TOPICAL_OINTMENT | OPHTHALMIC | 0 refills | Status: DC

## 2016-01-06 MED ORDER — TETRACAINE HCL 0.5 % OP SOLN
1.0000 [drp] | Freq: Once | OPHTHALMIC | Status: AC
  Administered 2016-01-06: 2 [drp] via OPHTHALMIC
  Filled 2016-01-06: qty 4

## 2016-01-06 MED ORDER — FLUORESCEIN SODIUM 1 MG OP STRP
1.0000 | ORAL_STRIP | Freq: Once | OPHTHALMIC | Status: AC
  Administered 2016-01-06: 1 via OPHTHALMIC
  Filled 2016-01-06: qty 1

## 2016-01-06 MED ORDER — TRAMADOL HCL 50 MG PO TABS: 50.0000 mg | ORAL_TABLET | Freq: Four times a day (QID) | ORAL | 0 refills | Status: DC | PRN

## 2016-01-06 MED ORDER — FLUORESCEIN SODIUM 1 MG OP STRP: 1.0000 | ORAL_STRIP | Freq: Once | OPHTHALMIC | Status: DC
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
## 2016-01-08 DIAGNOSIS — H16141 Punctate keratitis, right eye: Secondary | ICD-10-CM | POA: Diagnosis not present

## 2016-01-11 DIAGNOSIS — Z961 Presence of intraocular lens: Secondary | ICD-10-CM | POA: Diagnosis not present

## 2016-01-11 DIAGNOSIS — H409 Unspecified glaucoma: Secondary | ICD-10-CM | POA: Diagnosis not present

## 2016-01-11 DIAGNOSIS — H04123 Dry eye syndrome of bilateral lacrimal glands: Secondary | ICD-10-CM | POA: Diagnosis not present

## 2016-01-11 DIAGNOSIS — H02839 Dermatochalasis of unspecified eye, unspecified eyelid: Secondary | ICD-10-CM | POA: Diagnosis not present

## 2016-02-13 ENCOUNTER — Ambulatory Visit (INDEPENDENT_AMBULATORY_CARE_PROVIDER_SITE_OTHER): Payer: PPO | Admitting: Internal Medicine

## 2016-02-13 ENCOUNTER — Ambulatory Visit (INDEPENDENT_AMBULATORY_CARE_PROVIDER_SITE_OTHER)
Admission: RE | Admit: 2016-02-13 | Discharge: 2016-02-13 | Disposition: A | Discharge status: Home / Self Care | Payer: PPO | Source: Ambulatory Visit | Attending: Internal Medicine | Admitting: Internal Medicine

## 2016-02-13 ENCOUNTER — Encounter: Payer: Self-pay | Admitting: Internal Medicine

## 2016-02-13 DIAGNOSIS — M5117 Intervertebral disc disorders with radiculopathy, lumbosacral region: Secondary | ICD-10-CM

## 2016-02-13 DIAGNOSIS — Z8546 Personal history of malignant neoplasm of prostate: Secondary | ICD-10-CM

## 2016-02-13 DIAGNOSIS — M47816 Spondylosis without myelopathy or radiculopathy, lumbar region: Secondary | ICD-10-CM | POA: Diagnosis not present

## 2016-02-13 MED ORDER — TRAMADOL HCL 50 MG PO TABS: 50.0000 mg | ORAL_TABLET | Freq: Three times a day (TID) | ORAL | 0 refills | Status: DC | PRN

## 2016-02-13 MED ORDER — PREDNISONE 10 MG PO TABS: ORAL_TABLET | ORAL | 1 refills | Status: DC

## 2016-02-13 NOTE — Progress Notes (Signed)
Pre visit review using our clinic review tool, if applicable. No additional management support is needed unless otherwise documented below in the visit note. 

## 2016-02-13 NOTE — Assessment & Plan Note (Signed)
PSA 0.001 in 2014, 0.0 in 2016 LS X ray RTC in 2-3 wks

## 2016-02-13 NOTE — Assessment & Plan Note (Signed)
L - acute X ray Prednisone 10 mg: take 4 tabs a day x 3 days; then 3 tabs a day x 4 days; then 2 tabs a day x 4 days, then 1 tab a day x 6 days, then stop. Take pc.   Potential benefits of a  steroid and Tramadol use as well as potential risks  and complications were explained to the patient and were aknowledged.
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
## 2016-02-13 NOTE — Patient Instructions (Signed)
Sciatica With Rehab The sciatic nerve runs from the back down the leg and is responsible for sensation and control of the muscles in the back (posterior) side of the thigh, lower leg, and foot. Sciatica is a condition that is characterized by inflammation of this nerve.  SYMPTOMS   Signs of nerve damage, including numbness and/or weakness along the posterior side of the lower extremity.  Pain in the back of the thigh that may also travel down the leg.  Pain that worsens when sitting for long periods of time.  Occasionally, pain in the back or buttock. CAUSES  Inflammation of the sciatic nerve is the cause of sciatica. The inflammation is due to something irritating the nerve. Common sources of irritation include:  Sitting for long periods of time.  Direct trauma to the nerve.  Arthritis of the spine.  Herniated or ruptured disk.  Slipping of the vertebrae (spondylolisthesis).  Pressure from soft tissues, such as muscles or ligament-like tissue (fascia). RISK INCREASES WITH:  Sports that place pressure or stress on the spine (football or weightlifting).  Poor strength and flexibility.  Failure to warm up properly before activity.  Family history of low back pain or disk disorders.  Previous back injury or surgery.  Poor body mechanics, especially when lifting, or poor posture. PREVENTION   Warm up and stretch properly before activity.  Maintain physical fitness:  Strength, flexibility, and endurance.  Cardiovascular fitness.  Learn and use proper technique, especially with posture and lifting. When possible, have coach correct improper technique.  Avoid activities that place stress on the spine. PROGNOSIS If treated properly, then sciatica usually resolves within 6 weeks. However, occasionally surgery is necessary.  RELATED COMPLICATIONS   Permanent nerve damage, including pain, numbness, tingle, or weakness.  Chronic back pain.  Risks of surgery: infection,  bleeding, nerve damage, or damage to surrounding tissues. TREATMENT Treatment initially involves resting from any activities that aggravate your symptoms. The use of ice and medication may help reduce pain and inflammation. The use of strengthening and stretching exercises may help reduce pain with activity. These exercises may be performed at home or with referral to a therapist. A therapist may recommend further treatments, such as transcutaneous electronic nerve stimulation (TENS) or ultrasound. Your caregiver may recommend corticosteroid injections to help reduce inflammation of the sciatic nerve. If symptoms persist despite non-surgical (conservative) treatment, then surgery may be recommended. MEDICATION  If pain medication is necessary, then nonsteroidal anti-inflammatory medications, such as aspirin and ibuprofen, or other minor pain relievers, such as acetaminophen, are often recommended.  Do not take pain medication for 7 days before surgery.  Prescription pain relievers may be given if deemed necessary by your caregiver. Use only as directed and only as much as you need.  Ointments applied to the skin may be helpful.  Corticosteroid injections may be given by your caregiver. These injections should be reserved for the most serious cases, because they may only be given a certain number of times. HEAT AND COLD  Cold treatment (icing) relieves pain and reduces inflammation. Cold treatment should be applied for 10 to 15 minutes every 2 to 3 hours for inflammation and pain and immediately after any activity that aggravates your symptoms. Use ice packs or massage the area with a piece of ice (ice massage).  Heat treatment may be used prior to performing the stretching and strengthening activities prescribed by your caregiver, physical therapist, or athletic trainer. Use a heat pack or soak the injury in warm water.   SEEK MEDICAL CARE IF:  Treatment seems to offer no benefit, or the condition  worsens.  Any medications produce adverse side effects. EXERCISES  RANGE OF MOTION (ROM) AND STRETCHING EXERCISES - Sciatica Most people with sciatic will find that their symptoms worsen with either excessive bending forward (flexion) or arching at the low back (extension). The exercises which will help resolve your symptoms will focus on the opposite motion. Your physician, physical therapist or athletic trainer will help you determine which exercises will be most helpful to resolve your low back pain. Do not complete any exercises without first consulting with your clinician. Discontinue any exercises which worsen your symptoms until you speak to your clinician. If you have pain, numbness or tingling which travels down into your buttocks, leg or foot, the goal of the therapy is for these symptoms to move closer to your back and eventually resolve. Occasionally, these leg symptoms will get better, but your low back pain may worsen; this is typically an indication of progress in your rehabilitation. Be certain to be very alert to any changes in your symptoms and the activities in which you participated in the 24 hours prior to the change. Sharing this information with your clinician will allow him/her to most efficiently treat your condition. These exercises may help you when beginning to rehabilitate your injury. Your symptoms may resolve with or without further involvement from your physician, physical therapist or athletic trainer. While completing these exercises, remember:   Restoring tissue flexibility helps normal motion to return to the joints. This allows healthier, less painful movement and activity.  An effective stretch should be held for at least 30 seconds.  A stretch should never be painful. You should only feel a gentle lengthening or release in the stretched tissue. FLEXION RANGE OF MOTION AND STRETCHING EXERCISES: STRETCH - Flexion, Single Knee to Chest   Lie on a firm bed or floor  with both legs extended in front of you.  Keeping one leg in contact with the floor, bring your opposite knee to your chest. Hold your leg in place by either grabbing behind your thigh or at your knee.  Pull until you feel a gentle stretch in your low back. Hold __________ seconds.  Slowly release your grasp and repeat the exercise with the opposite side. Repeat __________ times. Complete this exercise __________ times per day.  STRETCH - Flexion, Double Knee to Chest  Lie on a firm bed or floor with both legs extended in front of you.  Keeping one leg in contact with the floor, bring your opposite knee to your chest.  Tense your stomach muscles to support your back and then lift your other knee to your chest. Hold your legs in place by either grabbing behind your thighs or at your knees.  Pull both knees toward your chest until you feel a gentle stretch in your low back. Hold __________ seconds.  Tense your stomach muscles and slowly return one leg at a time to the floor. Repeat __________ times. Complete this exercise __________ times per day.  STRETCH - Low Trunk Rotation   Lie on a firm bed or floor. Keeping your legs in front of you, bend your knees so they are both pointed toward the ceiling and your feet are flat on the floor.  Extend your arms out to the side. This will stabilize your upper body by keeping your shoulders in contact with the floor.  Gently and slowly drop both knees together to one side until   you feel a gentle stretch in your low back. Hold for __________ seconds.  Tense your stomach muscles to support your low back as you bring your knees back to the starting position. Repeat the exercise to the other side. Repeat __________ times. Complete this exercise __________ times per day  EXTENSION RANGE OF MOTION AND FLEXIBILITY EXERCISES: STRETCH - Extension, Prone on Elbows  Lie on your stomach on the floor, a bed will be too soft. Place your palms about shoulder  width apart and at the height of your head.  Place your elbows under your shoulders. If this is too painful, stack pillows under your chest.  Allow your body to relax so that your hips drop lower and make contact more completely with the floor.  Hold this position for __________ seconds.  Slowly return to lying flat on the floor. Repeat __________ times. Complete this exercise __________ times per day.  RANGE OF MOTION - Extension, Prone Press Ups  Lie on your stomach on the floor, a bed will be too soft. Place your palms about shoulder width apart and at the height of your head.  Keeping your back as relaxed as possible, slowly straighten your elbows while keeping your hips on the floor. You may adjust the placement of your hands to maximize your comfort. As you gain motion, your hands will come more underneath your shoulders.  Hold this position __________ seconds.  Slowly return to lying flat on the floor. Repeat __________ times. Complete this exercise __________ times per day.  STRENGTHENING EXERCISES - Sciatica  These exercises may help you when beginning to rehabilitate your injury. These exercises should be done near your "sweet spot." This is the neutral, low-back arch, somewhere between fully rounded and fully arched, that is your least painful position. When performed in this safe range of motion, these exercises can be used for people who have either a flexion or extension based injury. These exercises may resolve your symptoms with or without further involvement from your physician, physical therapist or athletic trainer. While completing these exercises, remember:   Muscles can gain both the endurance and the strength needed for everyday activities through controlled exercises.  Complete these exercises as instructed by your physician, physical therapist or athletic trainer. Progress with the resistance and repetition exercises only as your caregiver advises.  You may  experience muscle soreness or fatigue, but the pain or discomfort you are trying to eliminate should never worsen during these exercises. If this pain does worsen, stop and make certain you are following the directions exactly. If the pain is still present after adjustments, discontinue the exercise until you can discuss the trouble with your clinician. STRENGTHENING - Deep Abdominals, Pelvic Tilt   Lie on a firm bed or floor. Keeping your legs in front of you, bend your knees so they are both pointed toward the ceiling and your feet are flat on the floor.  Tense your lower abdominal muscles to press your low back into the floor. This motion will rotate your pelvis so that your tail bone is scooping upwards rather than pointing at your feet or into the floor.  With a gentle tension and even breathing, hold this position for __________ seconds. Repeat __________ times. Complete this exercise __________ times per day.  STRENGTHENING - Abdominals, Crunches   Lie on a firm bed or floor. Keeping your legs in front of you, bend your knees so they are both pointed toward the ceiling and your feet are flat on the   floor. Cross your arms over your chest.  Slightly tip your chin down without bending your neck.  Tense your abdominals and slowly lift your trunk high enough to just clear your shoulder blades. Lifting higher can put excessive stress on the low back and does not further strengthen your abdominal muscles.  Control your return to the starting position. Repeat __________ times. Complete this exercise __________ times per day.  STRENGTHENING - Quadruped, Opposite UE/LE Lift  Assume a hands and knees position on a firm surface. Keep your hands under your shoulders and your knees under your hips. You may place padding under your knees for comfort.  Find your neutral spine and gently tense your abdominal muscles so that you can maintain this position. Your shoulders and hips should form a rectangle  that is parallel with the floor and is not twisted.  Keeping your trunk steady, lift your right hand no higher than your shoulder and then your left leg no higher than your hip. Make sure you are not holding your breath. Hold this position __________ seconds.  Continuing to keep your abdominal muscles tense and your back steady, slowly return to your starting position. Repeat with the opposite arm and leg. Repeat __________ times. Complete this exercise __________ times per day.  STRENGTHENING - Abdominals and Quadriceps, Straight Leg Raise   Lie on a firm bed or floor with both legs extended in front of you.  Keeping one leg in contact with the floor, bend the other knee so that your foot can rest flat on the floor.  Find your neutral spine, and tense your abdominal muscles to maintain your spinal position throughout the exercise.  Slowly lift your straight leg off the floor about 6 inches for a count of 15, making sure to not hold your breath.  Still keeping your neutral spine, slowly lower your leg all the way to the floor. Repeat this exercise with each leg __________ times. Complete this exercise __________ times per day. POSTURE AND BODY MECHANICS CONSIDERATIONS - Sciatica Keeping correct posture when sitting, standing or completing your activities will reduce the stress put on different body tissues, allowing injured tissues a chance to heal and limiting painful experiences. The following are general guidelines for improved posture. Your physician or physical therapist will provide you with any instructions specific to your needs. While reading these guidelines, remember:  The exercises prescribed by your provider will help you have the flexibility and strength to maintain correct postures.  The correct posture provides the optimal environment for your joints to work. All of your joints have less wear and tear when properly supported by a spine with good posture. This means you will  experience a healthier, less painful body.  Correct posture must be practiced with all of your activities, especially prolonged sitting and standing. Correct posture is as important when doing repetitive low-stress activities (typing) as it is when doing a single heavy-load activity (lifting). RESTING POSITIONS Consider which positions are most painful for you when choosing a resting position. If you have pain with flexion-based activities (sitting, bending, stooping, squatting), choose a position that allows you to rest in a less flexed posture. You would want to avoid curling into a fetal position on your side. If your pain worsens with extension-based activities (prolonged standing, working overhead), avoid resting in an extended position such as sleeping on your stomach. Most people will find more comfort when they rest with their spine in a more neutral position, neither too rounded nor too   arched. Lying on a non-sagging bed on your side with a pillow between your knees, or on your back with a pillow under your knees will often provide some relief. Keep in mind, being in any one position for a prolonged period of time, no matter how correct your posture, can still lead to stiffness. PROPER SITTING POSTURE In order to minimize stress and discomfort on your spine, you must sit with correct posture Sitting with good posture should be effortless for a healthy body. Returning to good posture is a gradual process. Many people can work toward this most comfortably by using various supports until they have the flexibility and strength to maintain this posture on their own. When sitting with proper posture, your ears will fall over your shoulders and your shoulders will fall over your hips. You should use the back of the chair to support your upper back. Your low back will be in a neutral position, just slightly arched. You may place a small pillow or folded towel at the base of your low back for support.  When  working at a desk, create an environment that supports good, upright posture. Without extra support, muscles fatigue and lead to excessive strain on joints and other tissues. Keep these recommendations in mind: CHAIR:   A chair should be able to slide under your desk when your back makes contact with the back of the chair. This allows you to work closely.  The chair's height should allow your eyes to be level with the upper part of your monitor and your hands to be slightly lower than your elbows. BODY POSITION  Your feet should make contact with the floor. If this is not possible, use a foot rest.  Keep your ears over your shoulders. This will reduce stress on your neck and low back. INCORRECT SITTING POSTURES   If you are feeling tired and unable to assume a healthy sitting posture, do not slouch or slump. This puts excessive strain on your back tissues, causing more damage and pain. Healthier options include:  Using more support, like a lumbar pillow.  Switching tasks to something that requires you to be upright or walking.  Talking a brief walk.  Lying down to rest in a neutral-spine position. PROLONGED STANDING WHILE SLIGHTLY LEANING FORWARD  When completing a task that requires you to lean forward while standing in one place for a long time, place either foot up on a stationary 2-4 inch high object to help maintain the best posture. When both feet are on the ground, the low back tends to lose its slight inward curve. If this curve flattens (or becomes too large), then the back and your other joints will experience too much stress, fatigue more quickly and can cause pain.  CORRECT STANDING POSTURES Proper standing posture should be assumed with all daily activities, even if they only take a few moments, like when brushing your teeth. As in sitting, your ears should fall over your shoulders and your shoulders should fall over your hips. You should keep a slight tension in your abdominal  muscles to brace your spine. Your tailbone should point down to the ground, not behind your body, resulting in an over-extended swayback posture.  INCORRECT STANDING POSTURES  Common incorrect standing postures include a forward head, locked knees and/or an excessive swayback. WALKING Walk with an upright posture. Your ears, shoulders and hips should all line-up. PROLONGED ACTIVITY IN A FLEXED POSITION When completing a task that requires you to bend forward   at your waist or lean over a low surface, try to find a way to stabilize 3 of 4 of your limbs. You can place a hand or elbow on your thigh or rest a knee on the surface you are reaching across. This will provide you more stability so that your muscles do not fatigue as quickly. By keeping your knees relaxed, or slightly bent, you will also reduce stress across your low back. CORRECT LIFTING TECHNIQUES DO :   Assume a wide stance. This will provide you more stability and the opportunity to get as close as possible to the object which you are lifting.  Tense your abdominals to brace your spine; then bend at the knees and hips. Keeping your back locked in a neutral-spine position, lift using your leg muscles. Lift with your legs, keeping your back straight.  Test the weight of unknown objects before attempting to lift them.  Try to keep your elbows locked down at your sides in order get the best strength from your shoulders when carrying an object.  Always ask for help when lifting heavy or awkward objects. INCORRECT LIFTING TECHNIQUES DO NOT:   Lock your knees when lifting, even if it is a small object.  Bend and twist. Pivot at your feet or move your feet when needing to change directions.  Assume that you cannot safely pick up a paperclip without proper posture.   This information is not intended to replace advice given to you by your health care provider. Make sure you discuss any questions you have with your health care provider.     Document Released: 05/27/2005 Document Revised: 10/11/2014 Document Reviewed: 09/08/2008 [employer] Interactive Patient Education 2016 [employer]  

## 2016-03-01 ENCOUNTER — Ambulatory Visit (INDEPENDENT_AMBULATORY_CARE_PROVIDER_SITE_OTHER): Payer: PPO | Admitting: Internal Medicine

## 2016-03-01 ENCOUNTER — Other Ambulatory Visit (INDEPENDENT_AMBULATORY_CARE_PROVIDER_SITE_OTHER): Payer: PPO

## 2016-03-01 ENCOUNTER — Encounter: Payer: Self-pay | Admitting: Internal Medicine

## 2016-03-01 VITALS — BP 120/70 | HR 54 | Temp 98.1°F | Ht 67.0 in | Wt 162.0 lb

## 2016-03-01 DIAGNOSIS — K219 Gastro-esophageal reflux disease without esophagitis: Secondary | ICD-10-CM | POA: Diagnosis not present

## 2016-03-01 DIAGNOSIS — E785 Hyperlipidemia, unspecified: Secondary | ICD-10-CM | POA: Diagnosis not present

## 2016-03-01 DIAGNOSIS — Z0000 Encounter for general adult medical examination without abnormal findings: Secondary | ICD-10-CM

## 2016-03-01 DIAGNOSIS — Z86010 Personal history of colonic polyps: Secondary | ICD-10-CM | POA: Diagnosis not present

## 2016-03-01 DIAGNOSIS — M545 Low back pain, unspecified: Secondary | ICD-10-CM

## 2016-03-01 DIAGNOSIS — M79604 Pain in right leg: Secondary | ICD-10-CM | POA: Insufficient documentation

## 2016-03-01 DIAGNOSIS — Z8546 Personal history of malignant neoplasm of prostate: Secondary | ICD-10-CM | POA: Diagnosis not present

## 2016-03-01 DIAGNOSIS — Z23 Encounter for immunization: Secondary | ICD-10-CM

## 2016-03-01 LAB — BASIC METABOLIC PANEL WITH GFR
BUN: 14 mg/dL (ref 6–23)
CO2: 31 meq/L (ref 19–32)
Calcium: 9.1 mg/dL (ref 8.4–10.5)
Chloride: 105 meq/L (ref 96–112)
Creatinine, Ser: 1.12 mg/dL (ref 0.40–1.50)
GFR: 67.5 mL/min
Glucose, Bld: 85 mg/dL (ref 70–99)
Potassium: 4.2 meq/L (ref 3.5–5.1)
Sodium: 141 meq/L (ref 135–145)

## 2016-03-01 LAB — TSH: TSH: 0.68 u[IU]/mL (ref 0.35–4.50)

## 2016-03-01 LAB — HEPATIC FUNCTION PANEL
ALT: 21 U/L (ref 0–53)
AST: 17 U/L (ref 0–37)
Albumin: 4.1 g/dL (ref 3.5–5.2)
Alkaline Phosphatase: 62 U/L (ref 39–117)
Bilirubin, Direct: 0.4 mg/dL — ABNORMAL HIGH (ref 0.0–0.3)
Total Bilirubin: 1.8 mg/dL — ABNORMAL HIGH (ref 0.2–1.2)
Total Protein: 6.8 g/dL (ref 6.0–8.3)

## 2016-03-01 LAB — URINALYSIS
Hgb urine dipstick: NEGATIVE
Ketones, ur: NEGATIVE
Leukocytes, UA: NEGATIVE
Nitrite: NEGATIVE
Specific Gravity, Urine: 1.025
Total Protein, Urine: NEGATIVE
Urine Glucose: NEGATIVE
Urobilinogen, UA: 0.2
pH: 5.5 (ref 5.0–8.0)

## 2016-03-01 LAB — LIPID PANEL
Cholesterol: 168 mg/dL (ref 0–200)
HDL: 47.2 mg/dL (ref >39.00)
LDL CALC: 95 mg/dL (ref 0–99)
NONHDL: 120.5
Total CHOL/HDL Ratio: 4
Triglycerides: 128 mg/dL (ref 0.0–149.0)
VLDL: 25.6 mg/dL (ref 0.0–40.0)

## 2016-03-01 LAB — PSA: PSA: 0 ng/mL — ABNORMAL LOW (ref 0.10–4.00)

## 2016-03-01 LAB — CBC WITH DIFFERENTIAL/PLATELET
Basophils Absolute: 0 10*3/uL (ref 0.0–0.1)
Basophils Relative: 0.2 % (ref 0.0–3.0)
Eosinophils Absolute: 0 10*3/uL (ref 0.0–0.7)
Eosinophils Relative: 0.5 % (ref 0.0–5.0)
HCT: 44.7 % (ref 39.0–52.0)
Hemoglobin: 15.1 g/dL (ref 13.0–17.0)
Lymphocytes Relative: 20.6 % (ref 12.0–46.0)
Lymphs Abs: 1.7 10*3/uL (ref 0.7–4.0)
MCHC: 33.8 g/dL (ref 30.0–36.0)
MCV: 95 fl (ref 78.0–100.0)
Monocytes Absolute: 0.7 10*3/uL (ref 0.1–1.0)
Monocytes Relative: 8.8 % (ref 3.0–12.0)
Neutro Abs: 5.6 10*3/uL (ref 1.4–7.7)
Neutrophils Relative %: 69.9 % (ref 43.0–77.0)
Platelets: 174 10*3/uL (ref 150.0–400.0)
RBC: 4.7 Mil/uL (ref 4.22–5.81)
RDW: 13.5 % (ref 11.5–15.5)
WBC: 8.1 10*3/uL (ref 4.0–10.5)

## 2016-03-01 NOTE — Patient Instructions (Signed)

## 2016-03-01 NOTE — Assessment & Plan Note (Signed)
On Prilosec The risks of long term PPIs were discussed with her with current data including increased risk for chronic kidney disease, increased risk of fracture, increased risk of C diff, increased risk of pneumonia (short term usage), potentially increased risk of dementia, potentially increased risk of B12 / calcium deficiency, and rare risk of hypomagnesemia. Recent studies have also shown an association with increased risk of cardiovascular outcomes including stroke. These studies have showed an association between PPIs and several of these outcomes but no evidence of causality. The patient was counseled to use the lowest daily use of PPI needed to control symptoms. Renal function is currently normal and would recommend checking this yearly while on PPI therapy.

## 2016-03-01 NOTE — Assessment & Plan Note (Signed)
Resolved Stretch

## 2016-03-01 NOTE — Assessment & Plan Note (Signed)
Total Prostatectomy  2007

## 2016-03-01 NOTE — Addendum Note (Signed)
Addended by: Cresenciano Lick on: 03/01/2016 09:40 AM   Modules accepted: Orders

## 2016-03-01 NOTE — Progress Notes (Signed)
Pre visit review using our clinic review tool, if applicable. No additional management support is needed unless otherwise documented below in the visit note. 

## 2016-03-01 NOTE — Assessment & Plan Note (Signed)
Here for medicare wellness/physical  Diet: heart healthy  Physical activity: not sedentary  Depression/mood screen: negative  Hearing: intact to whispered voice  Visual acuity: grossly normal, performs annual eye exam  ADLs: capable  Fall risk: none  Home safety: good  Cognitive evaluation: intact to orientation, naming, recall and repetition  EOL planning: adv directives, full code/ I agree  I have personally reviewed and have noted  1. The patient's medical, surgical and social history  2. Their use of alcohol, tobacco or illicit drugs  3. Their current medications and supplements  4. The patient's functional ability including ADL's, fall risks, home safety risks and hearing or visual impairment.  5. Diet and physical activities  6. Evidence for depression or mood disorders 7. The roster of all physicians providing medical care to patient - is listed in the Snapshot section of the chart and reviewed today.    Today patient counseled on age appropriate routine health concerns for screening and prevention, each reviewed and up to date or declined. Immunizations reviewed and up to date or declined. Labs ordered and reviewed. Risk factors for depression reviewed and negative. Hearing function and visual acuity are intact. ADLs screened and addressed as needed. Functional ability and level of safety reviewed and appropriate. Education, counseling and referrals performed based on assessed risks today. Patient provided with a copy of personalized plan for preventive services.   Colon due 2024

## 2016-03-01 NOTE — Progress Notes (Signed)
Subjective:  Patient ID: Edwin Ramirez, male    DOB: 1941/01/10  Age: 75 y.o. MRN: [id number]  CC: No chief complaint on file.   HPI Edwin Ramirez presents for a well exam  Outpatient Medications Prior to Visit  Medication Sig Dispense Refill  . Cholecalciferol (VITAMIN D3) 2000 UNITS capsule Take 1 capsule (2,000 Units total) by mouth daily. 100 capsule 3  . erythromycin ophthalmic ointment Place a 1/2 inch ribbon of ointment into the lower right eyelid four times a day 1 g 0  . Glucosamine-Chondroit-Vit C-Mn (GLUCOSAMINE 1500 COMPLEX PO) Take 2 capsules by mouth daily.    Marland Kitchen latanoprost (XALATAN) 0.005 % ophthalmic solution Place 1 drop into both eyes at bedtime.     Marland Kitchen LORazepam (ATIVAN) 1 MG tablet TAKE 1/2 TABLET BY MOUTH EVERY 8 HOURS AS NEEDED 90 tablet 1  . Multiple Vitamin (MULTIVITAMIN) tablet Take 1 tablet by mouth daily.      . Omega-3 Fatty Acids (FISH OIL PO) Take 1 capsule by mouth daily.     Marland Kitchen omeprazole (PRILOSEC) 40 MG capsule Take 1 capsule (40 mg total) by mouth 2 (two) times daily. 180 capsule 3  . predniSONE (DELTASONE) 10 MG tablet Prednisone 10 mg: take 4 tabs a day x 3 days; then 3 tabs a day x 4 days; then 2 tabs a day x 4 days, then 1 tab a day x 6 days, then stop. Take pc. 38 tablet 1  . simvastatin (ZOCOR) 20 MG tablet Take 1 tablet (20 mg total) by mouth at bedtime. 90 tablet 3  . traMADol (ULTRAM) 50 MG tablet Take 1 tablet (50 mg total) by mouth every 8 (eight) hours as needed for severe pain. 60 tablet 0   No facility-administered medications prior to visit.     ROS Review of Systems  Constitutional: Negative for appetite change, fatigue and unexpected weight change.  HENT: Negative for congestion, nosebleeds, sneezing, sore throat and trouble swallowing.   Eyes: Negative for itching and visual disturbance.  Respiratory: Negative for cough.   Cardiovascular: Negative for chest pain, palpitations and leg swelling.  Gastrointestinal:  Negative for abdominal distention, blood in stool, diarrhea and nausea.  Genitourinary: Negative for frequency and hematuria.  Musculoskeletal: Negative for back pain, gait problem, joint swelling and neck pain.  Skin: Negative for rash.  Neurological: Negative for dizziness, tremors, speech difficulty and weakness.  Psychiatric/Behavioral: Negative for agitation, dysphoric mood and sleep disturbance. The patient is not nervous/anxious.     Objective:  BP 120/70   Pulse (!) 54   Temp 98.1 F (36.7 C) (Oral)   Ht 5\' 7"  (1.702 m)   Wt 162 lb (73.5 kg)   SpO2 95%   BMI 25.37 kg/m   BP Readings from Last 3 Encounters:  03/01/16 120/70  02/13/16 120/70  01/06/16 127/72    Wt Readings from Last 3 Encounters:  03/01/16 162 lb (73.5 kg)  02/13/16 169 lb (76.7 kg)  05/24/15 159 lb (72.1 kg)    Physical Exam  Constitutional: He is oriented to person, place, and time. He appears well-developed. No distress.  NAD  HENT:  Mouth/Throat: Oropharynx is clear and moist.  Eyes: Conjunctivae are normal. Pupils are equal, round, and reactive to light.  Neck: Normal range of motion. No JVD present. No thyromegaly present.  Cardiovascular: Normal rate, regular rhythm, normal heart sounds and intact distal pulses.  Exam reveals no gallop and no friction rub.   No murmur heard. Pulmonary/Chest: Effort normal  and breath sounds normal. No respiratory distress. He has no wheezes. He has no rales. He exhibits no tenderness.  Abdominal: Soft. Bowel sounds are normal. He exhibits no distension and no mass. There is no tenderness. There is no rebound and no guarding.  Genitourinary: Rectum normal and penis normal. Rectal exam shows guaiac negative stool. No penile tenderness.  Musculoskeletal: Normal range of motion. He exhibits no edema or tenderness.  Lymphadenopathy:    He has no cervical adenopathy.  Neurological: He is alert and oriented to person, place, and time. He has normal reflexes. No  cranial nerve deficit. He exhibits normal muscle tone. He displays a negative Romberg sign. Coordination and gait normal.  Skin: Skin is warm and dry. No rash noted.  Psychiatric: He has a normal mood and affect. His behavior is normal. Judgment and thought content normal.  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 Lab Results  Component Value Date   WBC 4.9 03/01/2015   HGB 15.0 03/01/2015   HCT 45.0 03/01/2015   PLT 166.0 03/01/2015   GLUCOSE 94 03/01/2015   CHOL 145 03/01/2015   TRIG 129.0 03/01/2015   HDL 34.20 (L) 03/01/2015   LDLDIRECT 78.8 02/05/2011   LDLCALC 85 03/01/2015   ALT 17 03/01/2015   AST 21 03/01/2015   NA 141 03/01/2015   K 4.0 03/01/2015   CL 105 03/01/2015   CREATININE 1.11 03/01/2015   BUN 13 03/01/2015   CO2 30 03/01/2015   TSH 1.88 03/01/2015   PSA 0.00 (L) 03/01/2015   HGBA1C 5.7 03/07/2014    Dg Lumbar Spine 2-3 Views  Result Date: 02/13/2016 CLINICAL DATA:  Chronic low back pain EXAM: LUMBAR SPINE - 3 VIEW COMPARISON:  05/23/2008. FINDINGS: Five lumbar type vertebral bodies are well visualized. Vertebral body height is well maintained. Mild osteophytic changes are noted at L2-3 and L3-4. Mild grade 1 anterolisthesis is noted of L5 on S1. Bilateral pars defects are noted at L5. Aortic calcifications are noted without aneurysmal dilatation. IMPRESSION: Degenerative change and grade 1 anterolisthesis at L5-S1 stable from the prior exam. Electronically Signed   By: Inez Catalina M.D.   On: 02/13/2016 16:12    Assessment & Plan:   Diagnoses and all orders for this visit:  COLONIC POLYPS, ADENOMATOUS, HX OF  Well adult exam  PROSTATE CANCER, HX OF  Gastroesophageal reflux disease without esophagitis  LBP radiating to right leg   I am having [name] maintain his multivitamin, latanoprost, Omega-3 Fatty Acids (FISH OIL PO), Glucosamine-Chondroit-Vit C-Mn (GLUCOSAMINE 1500 COMPLEX PO), Vitamin D3, simvastatin, omeprazole, LORazepam, erythromycin,  predniSONE, and traMADol.  No orders of the defined types were placed in this encounter.    Follow-up: No Follow-up on file.  Walker Kehr, MD

## 2016-03-01 NOTE — Assessment & Plan Note (Signed)
Colon due 2024

## 2016-04-26 ENCOUNTER — Other Ambulatory Visit (INDEPENDENT_AMBULATORY_CARE_PROVIDER_SITE_OTHER): Payer: PPO

## 2016-04-26 ENCOUNTER — Ambulatory Visit (INDEPENDENT_AMBULATORY_CARE_PROVIDER_SITE_OTHER)
Admission: RE | Admit: 2016-04-26 | Discharge: 2016-04-26 | Disposition: A | Discharge status: Home / Self Care | Payer: PPO | Source: Ambulatory Visit | Attending: Internal Medicine | Admitting: Internal Medicine

## 2016-04-26 ENCOUNTER — Ambulatory Visit (INDEPENDENT_AMBULATORY_CARE_PROVIDER_SITE_OTHER): Payer: PPO | Admitting: Internal Medicine

## 2016-04-26 VITALS — BP 104/64 | HR 83 | Temp 97.5°F | Resp 16 | Wt 171.0 lb

## 2016-04-26 DIAGNOSIS — M546 Pain in thoracic spine: Secondary | ICD-10-CM | POA: Diagnosis not present

## 2016-04-26 DIAGNOSIS — Z8546 Personal history of malignant neoplasm of prostate: Secondary | ICD-10-CM | POA: Diagnosis not present

## 2016-04-26 DIAGNOSIS — R1011 Right upper quadrant pain: Secondary | ICD-10-CM

## 2016-04-26 DIAGNOSIS — R079 Chest pain, unspecified: Secondary | ICD-10-CM | POA: Diagnosis not present

## 2016-04-26 DIAGNOSIS — M5134 Other intervertebral disc degeneration, thoracic region: Secondary | ICD-10-CM | POA: Diagnosis not present

## 2016-04-26 DIAGNOSIS — M549 Dorsalgia, unspecified: Secondary | ICD-10-CM | POA: Insufficient documentation

## 2016-04-26 LAB — CBC WITH DIFFERENTIAL/PLATELET
BASOS PCT: 0.4 % (ref 0.0–3.0)
Basophils Absolute: 0 10*3/uL (ref 0.0–0.1)
EOS PCT: 2.4 % (ref 0.0–5.0)
Eosinophils Absolute: 0.2 10*3/uL (ref 0.0–0.7)
HCT: 42.2 % (ref 39.0–52.0)
Hemoglobin: 14.1 g/dL (ref 13.0–17.0)
LYMPHS ABS: 1.6 10*3/uL (ref 0.7–4.0)
Lymphocytes Relative: 24.5 % (ref 12.0–46.0)
MCHC: 33.4 g/dL (ref 30.0–36.0)
MCV: 95 fl (ref 78.0–100.0)
MONO ABS: 0.6 10*3/uL (ref 0.1–1.0)
MONOS PCT: 9.8 % (ref 3.0–12.0)
NEUTROS PCT: 62.9 % (ref 43.0–77.0)
Neutro Abs: 4.2 10*3/uL (ref 1.4–7.7)
Platelets: 176 10*3/uL (ref 150.0–400.0)
RBC: 4.44 Mil/uL (ref 4.22–5.81)
RDW: 13.2 % (ref 11.5–15.5)
WBC: 6.6 10*3/uL (ref 4.0–10.5)

## 2016-04-26 LAB — BASIC METABOLIC PANEL
BUN: 15 mg/dL (ref 6–23)
CALCIUM: 9.5 mg/dL (ref 8.4–10.5)
CO2: 30 mEq/L (ref 19–32)
Chloride: 105 mEq/L (ref 96–112)
Creatinine, Ser: 1.11 mg/dL (ref 0.40–1.50)
GFR: 68.18 mL/min (ref >60.00)
GLUCOSE: 97 mg/dL (ref 70–99)
POTASSIUM: 4 meq/L (ref 3.5–5.1)
SODIUM: 141 meq/L (ref 135–145)

## 2016-04-26 LAB — LIPASE: LIPASE: 25 U/L (ref 11.0–59.0)

## 2016-04-26 LAB — HEPATIC FUNCTION PANEL
ALBUMIN: 4.3 g/dL (ref 3.5–5.2)
ALK PHOS: 72 U/L (ref 39–117)
ALT: 20 U/L (ref 0–53)
AST: 20 U/L (ref 0–37)
BILIRUBIN DIRECT: 0.2 mg/dL (ref 0.0–0.3)
BILIRUBIN TOTAL: 0.8 mg/dL (ref 0.2–1.2)
Total Protein: 7.1 g/dL (ref 6.0–8.3)

## 2016-04-26 MED ORDER — TIZANIDINE HCL 4 MG PO TABS: 4.0000 mg | ORAL_TABLET | Freq: Four times a day (QID) | ORAL | 1 refills | Status: DC | PRN

## 2016-04-26 MED ORDER — TRAMADOL HCL 50 MG PO TABS: 50.0000 mg | ORAL_TABLET | Freq: Three times a day (TID) | ORAL | 0 refills | Status: DC | PRN

## 2016-04-26 NOTE — Assessment & Plan Note (Signed)
Has been well controlled per pt with 0.0 PSA on yearly f/u, doubt bony met pain unless has other primary,  to f/u any worsening symptoms or concerns

## 2016-04-26 NOTE — Assessment & Plan Note (Signed)
With right side and right flank pain  - ? MSK vs neuritic, exam benign except for + muscular spasm like firmness noted at right flank and overt pain with simply lying flat; for cxr, tspine films, tramadol prn, muscle relaxer prn, .folllowj

## 2016-04-26 NOTE — Assessment & Plan Note (Signed)
S/p ccx, ? MSK vs neuritic vs other, for labs as documented,  to f/u any worsening symptoms or concerns

## 2016-04-26 NOTE — Progress Notes (Signed)
Pre visit review using our clinic review tool, if applicable. No additional management support is needed unless otherwise documented below in the visit note. 

## 2016-04-26 NOTE — Progress Notes (Signed)
Subjective:    Patient ID: Edwin Ramirez, male    DOB: 1940/07/24, 75 y.o.   MRN: [id number]  HPI  Here with c/o RUQ, right side and right flank area pain x 1 wk, sharp, intermittent, mild to mod but can be severe, and only symptomatic with lying down more on his back than right side, seems most often to start at the RUQ and radiates to the back.  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  Denies urinary symptoms such as dysuria, frequency, urgency, flank pain, hematuria or n/v, fever, chills.  Denies worsening reflux, other abd pain, dysphagia, n/v, bowel change or blood. No worsening lower back pain or falls.  Has hx of prostate ca x 10 yrs, with bone scan at the initial dx neg for mets, and PSA 0.0 since then per pt.  Has tramadol at home but has not tried. Past Medical History:  Diagnosis Date  . Basal cell cancer    Dr Ubaldo Glassing  . DDD (degenerative disc disease)   . Diverticulosis   . Esophageal stricture    Dr Olevia Perches  . Gastritis   . GERD (gastroesophageal reflux disease)   . Gilbert syndrome   . Glaucoma   . Hepatic cyst   . History of prostate cancer    Dr Alinda Money  . Hx of adenomatous colonic polyps   . Hyperlipidemia   . Internal hemorrhoids   . Pulmonary nodule    not seen on F/U 2014  . TMJ (dislocation of temporomandibular joint)    Past Surgical History:  Procedure Laterality Date  . APPENDECTOMY    . cataract surgery  2012   bilateral  . CHOLECYSTECTOMY  1985  . COLONOSCOPY W/ POLYPECTOMY  2004   Negative 2009 & 2013 Dr.Brodie  . ESOPHAGEAL DILATION  2004  . MOHS SURGERY     Basal Cell  . PROSTATECTOMY  07/2005   Dr.Borden  . ROTATOR CUFF REPAIR      X 2-Left; Dr Percell Miller    reports that he has never smoked. He has never used smokeless tobacco. He reports that he drinks about 3.0 oz of alcohol per week . He reports that he does not use drugs. family history includes Colon  cancer (age of onset: 42) in his mother; Coronary artery disease in his brother; Heart attack (age of onset: 4) in his maternal uncle; Heart failure in his mother; Multiple myeloma in his father. Allergies  Allergen Reactions  . Codeine     REACTION: agitation, mental status changes with high doses of codeine post op  . Nabumetone     Mental status changes post op with Relafen  . Aspirin     gastritis   Current Outpatient Prescriptions on File Prior to Visit  Medication Sig Dispense Refill  . Cholecalciferol (VITAMIN D3) 2000 UNITS capsule Take 1 capsule (2,000 Units total) by mouth daily. 100 capsule 3  . erythromycin ophthalmic ointment Place a 1/2 inch ribbon of ointment into the lower right eyelid four times a day 1 g 0  . Glucosamine-Chondroit-Vit C-Mn (GLUCOSAMINE 1500 COMPLEX PO) Take 2 capsules by mouth daily.    Marland Kitchen latanoprost (XALATAN) 0.005 % ophthalmic solution Place 1 drop into both eyes at bedtime.     Marland Kitchen LORazepam (ATIVAN) 1 MG tablet TAKE 1/2 TABLET BY MOUTH EVERY 8 HOURS AS NEEDED 90 tablet 1  . Multiple Vitamin (MULTIVITAMIN)  tablet Take 1 tablet by mouth daily.      . Omega-3 Fatty Acids (FISH OIL PO) Take 1 capsule by mouth daily.     Marland Kitchen omeprazole (PRILOSEC) 40 MG capsule Take 1 capsule (40 mg total) by mouth 2 (two) times daily. 180 capsule 3  . simvastatin (ZOCOR) 20 MG tablet Take 1 tablet (20 mg total) by mouth at bedtime. 90 tablet 3   No current facility-administered medications on file prior to visit.    Review of Systems  Constitutional: Negative for unusual diaphoresis or night sweats HENT: Negative for ear swelling or discharge Eyes: Negative for worsening visual haziness  Respiratory: Negative for choking and stridor.   Gastrointestinal: Negative for distension or worsening eructation Genitourinary: Negative for retention or change in urine volume.  Musculoskeletal: Negative for other MSK pain or swelling Skin: Negative for color change and worsening  wound Neurological: Negative for tremors and numbness other than noted  Psychiatric/Behavioral: Negative for decreased concentration or agitation other than above   All other system neg per pt    Objective:   Physical Exam BP 104/64   Pulse 83   Temp 97.5 F (36.4 C) (Oral)   Resp 16   Wt 171 lb (77.6 kg)   SpO2 97%   BMI 26.78 kg/m  VS noted, not ill appaering or fatigued or weak Constitutional: Pt appears in no apparent distress HENT: Head: NCAT.  Right Ear: External ear normal.  Left Ear: External ear normal.  Eyes: . Pupils are equal, round, and reactive to light. Conjunctivae and EOM are normal Neck: Normal range of motion. Neck supple.  Cardiovascular: Normal rate and regular rhythm.   Pulmonary/Chest: Effort normal and breath sounds without rales or wheezing.  Has some left upper back and LUE atrophy assoc with permanent nerve damage Abd:  Soft, NT, ND, + BS, no flank tender bilat but has firmness and tender to right flank area ? Muscle spasm like pain vs other; + pain induced by simply lying on back on exam table Spine NT, without swelling or redness  Neurological: Pt is alert. Not confused , motor grossly intact Skin: Skin is warm. No rash, no LE edema Psychiatric: Pt behavior is normal. No agitation.  No other new exam findings    Assessment & Plan:

## 2016-04-26 NOTE — Patient Instructions (Signed)
Please take all new medication as prescribed - the muscle relaxer as needed  Please continue all other medications as before, and refills have been done for the tramadol as well  Please have the pharmacy call with any other refills you may need.  Please keep your appointments with your specialists as you may have planned  Please go to the XRAY Department in the Basement (go straight as you get off the elevator) for the x-ray testing  Please go to the LAB in the Basement (turn left off the elevator) for the tests to be done today  You will be contacted by phone if any changes need to be made immediately.  Otherwise, you will receive a letter about your results with an explanation, but please check with MyChart first.  Please remember to sign up for MyChart if you have not done so, as this will be important to you in the future with finding out test results, communicating by private email, and scheduling acute appointments online when needed.

## 2016-04-29 LAB — URINALYSIS, ROUTINE W REFLEX MICROSCOPIC
Bilirubin Urine: NEGATIVE
Hgb urine dipstick: NEGATIVE
Ketones, ur: NEGATIVE
Leukocytes, UA: NEGATIVE
NITRITE: NEGATIVE
PH: 6 (ref 5.0–8.0)
RBC / HPF: NONE SEEN (ref 0–?)
SPECIFIC GRAVITY, URINE: 1.015 (ref 1.000–1.030)
Total Protein, Urine: NEGATIVE
URINE GLUCOSE: NEGATIVE
Urobilinogen, UA: 0.2 (ref 0.0–1.0)
WBC UA: NONE SEEN (ref 0–?)

## 2016-05-11 ENCOUNTER — Other Ambulatory Visit: Payer: Self-pay | Admitting: Internal Medicine

## 2016-05-21 DIAGNOSIS — M19012 Primary osteoarthritis, left shoulder: Secondary | ICD-10-CM | POA: Diagnosis not present

## 2016-07-08 ENCOUNTER — Encounter: Payer: Self-pay | Admitting: Internal Medicine

## 2016-07-08 ENCOUNTER — Other Ambulatory Visit (INDEPENDENT_AMBULATORY_CARE_PROVIDER_SITE_OTHER): Payer: PPO

## 2016-07-08 ENCOUNTER — Ambulatory Visit (INDEPENDENT_AMBULATORY_CARE_PROVIDER_SITE_OTHER): Payer: PPO | Admitting: Internal Medicine

## 2016-07-08 DIAGNOSIS — R1010 Upper abdominal pain, unspecified: Secondary | ICD-10-CM

## 2016-07-08 DIAGNOSIS — R110 Nausea: Secondary | ICD-10-CM | POA: Diagnosis not present

## 2016-07-08 DIAGNOSIS — R109 Unspecified abdominal pain: Secondary | ICD-10-CM | POA: Insufficient documentation

## 2016-07-08 LAB — LIPASE: LIPASE: 40 U/L (ref 11.0–59.0)

## 2016-07-08 LAB — URINALYSIS
BILIRUBIN URINE: NEGATIVE
Hgb urine dipstick: NEGATIVE
KETONES UR: NEGATIVE
LEUKOCYTES UA: NEGATIVE
Nitrite: NEGATIVE
PH: 5.5 (ref 5.0–8.0)
Specific Gravity, Urine: 1.025 (ref 1.000–1.030)
TOTAL PROTEIN, URINE-UPE24: NEGATIVE
Urine Glucose: NEGATIVE
Urobilinogen, UA: 0.2 (ref 0.0–1.0)

## 2016-07-08 LAB — CBC WITH DIFFERENTIAL/PLATELET
BASOS ABS: 0 10*3/uL (ref 0.0–0.1)
Basophils Relative: 0.8 % (ref 0.0–3.0)
EOS ABS: 0.1 10*3/uL (ref 0.0–0.7)
Eosinophils Relative: 2.1 % (ref 0.0–5.0)
HCT: 44.3 % (ref 39.0–52.0)
Hemoglobin: 14.9 g/dL (ref 13.0–17.0)
LYMPHS ABS: 1.5 10*3/uL (ref 0.7–4.0)
Lymphocytes Relative: 27.4 % (ref 12.0–46.0)
MCHC: 33.6 g/dL (ref 30.0–36.0)
MCV: 96.9 fl (ref 78.0–100.0)
Monocytes Absolute: 0.6 10*3/uL (ref 0.1–1.0)
Monocytes Relative: 10.7 % (ref 3.0–12.0)
NEUTROS ABS: 3.2 10*3/uL (ref 1.4–7.7)
NEUTROS PCT: 59 % (ref 43.0–77.0)
PLATELETS: 162 10*3/uL (ref 150.0–400.0)
RBC: 4.57 Mil/uL (ref 4.22–5.81)
RDW: 13.2 % (ref 11.5–15.5)
WBC: 5.5 10*3/uL (ref 4.0–10.5)

## 2016-07-08 LAB — HEPATIC FUNCTION PANEL
ALBUMIN: 4.3 g/dL (ref 3.5–5.2)
ALT: 21 U/L (ref 0–53)
AST: 22 U/L (ref 0–37)
Alkaline Phosphatase: 62 U/L (ref 39–117)
BILIRUBIN DIRECT: 0.2 mg/dL (ref 0.0–0.3)
TOTAL PROTEIN: 7.1 g/dL (ref 6.0–8.3)
Total Bilirubin: 0.8 mg/dL (ref 0.2–1.2)

## 2016-07-08 LAB — SEDIMENTATION RATE: SED RATE: 29 mm/h — AB (ref 0–20)

## 2016-07-08 NOTE — Assessment & Plan Note (Signed)
CT abd Labs Tramadol prn Hold Zocor

## 2016-07-08 NOTE — Progress Notes (Signed)
Subjective:  Patient ID: Edwin Ramirez, male    DOB: Mar 21, 1941  Age: 76 y.o. MRN: [id number]  CC: No chief complaint on file.   HPI CHESS TREVETT presents for upper abd pain x 3-4 weeks - worse Food makes it worse. Off and on. C/o nausea. GERD is worse. C/o burping. No alcohol   Outpatient Medications Prior to Visit  Medication Sig Dispense Refill  . Cholecalciferol (VITAMIN D3) 2000 UNITS capsule Take 1 capsule (2,000 Units total) by mouth daily. 100 capsule 3  . erythromycin ophthalmic ointment Place a 1/2 inch ribbon of ointment into the lower right eyelid four times a day 1 g 0  . Glucosamine-Chondroit-Vit C-Mn (GLUCOSAMINE 1500 COMPLEX PO) Take 2 capsules by mouth daily.    Marland Kitchen latanoprost (XALATAN) 0.005 % ophthalmic solution Place 1 drop into both eyes at bedtime.     Marland Kitchen LORazepam (ATIVAN) 1 MG tablet TAKE 1/2 TABLET BY MOUTH EVERY 8 HOURS AS NEEDED 90 tablet 1  . Multiple Vitamin (MULTIVITAMIN) tablet Take 1 tablet by mouth daily.      . Omega-3 Fatty Acids (FISH OIL PO) Take 1 capsule by mouth daily.     Marland Kitchen omeprazole (PRILOSEC) 40 MG capsule Take 1 capsule (40 mg total) by mouth 2 (two) times daily. 180 capsule 3  . simvastatin (ZOCOR) 20 MG tablet Take 1 tablet (20 mg total) by mouth at bedtime. 90 tablet 3  . simvastatin (ZOCOR) 20 MG tablet TAKE ONE (1) TABLET BY MOUTH AT BEDTIME 90 tablet 3  . tiZANidine (ZANAFLEX) 4 MG tablet Take 1 tablet (4 mg total) by mouth every 6 (six) hours as needed for muscle spasms. 40 tablet 1  . traMADol (ULTRAM) 50 MG tablet Take 1 tablet (50 mg total) by mouth every 8 (eight) hours as needed for severe pain. 60 tablet 0   No facility-administered medications prior to visit.     ROS Review of Systems  Constitutional: Negative for appetite change, fatigue and unexpected weight change.  HENT: Negative for congestion, nosebleeds, sneezing, sore throat and trouble swallowing.   Eyes: Negative for itching and visual disturbance.    Respiratory: Negative for cough.   Cardiovascular: Negative for chest pain, palpitations and leg swelling.  Gastrointestinal: Positive for abdominal pain and nausea. Negative for abdominal distention, blood in stool and diarrhea.  Genitourinary: Negative for frequency and hematuria.  Musculoskeletal: Negative for back pain, gait problem, joint swelling and neck pain.  Skin: Negative for rash.  Neurological: Negative for dizziness, tremors, speech difficulty and weakness.  Psychiatric/Behavioral: Negative for agitation, dysphoric mood and sleep disturbance. The patient is not nervous/anxious.     Objective:  BP 136/80   Pulse 64   Temp 98.7 F (37.1 C) (Oral)   Resp 20   Wt 169 lb (76.7 kg)   SpO2 98%   BMI 26.47 kg/m   BP Readings from Last 3 Encounters:  07/08/16 136/80  04/26/16 104/64  03/01/16 120/70    Wt Readings from Last 3 Encounters:  07/08/16 169 lb (76.7 kg)  04/26/16 171 lb (77.6 kg)  03/01/16 162 lb (73.5 kg)    Physical Exam  Constitutional: He is oriented to person, place, and time. He appears well-developed. No distress.  NAD  HENT:  Mouth/Throat: Oropharynx is clear and moist.  Eyes: Conjunctivae are normal. Pupils are equal, round, and reactive to light.  Neck: Normal range of motion. No JVD present. No thyromegaly present.  Cardiovascular: Normal rate, regular rhythm, normal heart sounds and  intact distal pulses.  Exam reveals no gallop and no friction rub.   No murmur heard. Pulmonary/Chest: Effort normal and breath sounds normal. No respiratory distress. He has no wheezes. He has no rales. He exhibits no tenderness.  Abdominal: Soft. Bowel sounds are normal. He exhibits no distension and no mass. There is tenderness. There is no rebound and no guarding.  Musculoskeletal: Normal range of motion. He exhibits no edema or tenderness.  Lymphadenopathy:    He has no cervical adenopathy.  Neurological: He is alert and oriented to person, place, and time.  He has normal reflexes. No cranial nerve deficit. He exhibits normal muscle tone. He displays a negative Romberg sign. Coordination and gait normal.  Skin: Skin is warm and dry. No rash noted.  Psychiatric: He has a normal mood and affect. His behavior is normal. Judgment and thought content normal.  Upper 1/2 abd is sensitive to deep palpation  Lab Results  Component Value Date   WBC 6.6 04/26/2016   HGB 14.1 04/26/2016   HCT 42.2 04/26/2016   PLT 176.0 04/26/2016   GLUCOSE 97 04/26/2016   CHOL 168 03/01/2016   TRIG 128.0 03/01/2016   HDL 47.20 03/01/2016   LDLDIRECT 78.8 02/05/2011   LDLCALC 95 03/01/2016   ALT 20 04/26/2016   AST 20 04/26/2016   NA 141 04/26/2016   K 4.0 04/26/2016   CL 105 04/26/2016   CREATININE 1.11 04/26/2016   BUN 15 04/26/2016   CO2 30 04/26/2016   TSH 0.68 03/01/2016   PSA 0.00 (L) 03/01/2016   HGBA1C 5.7 03/07/2014    Dg Chest 2 View  Result Date: 04/26/2016 CLINICAL DATA:  Chest pain EXAM: CHEST  2 VIEW COMPARISON:  July 30, 2012 FINDINGS: There is mild generalized interstitial thickening without frank edema or consolidation. Heart size and pulmonary vascularity are within normal limits. No adenopathy. No pneumothorax. No bone lesions. IMPRESSION: Generalized interstitial thickening, likely due to chronic inflammatory type change with questionable mild fibrotic type change. No frank edema or consolidation. Stable cardiac silhouette. Electronically Signed   By: Lowella Grip III M.D.   On: 04/26/2016 19:35   Dg Thoracic Spine 2 View  Result Date: 04/26/2016 CLINICAL DATA:  Dorsalgia EXAM: THORACIC SPINE 2 VIEWS COMPARISON:  Chest radiograph July 30, 2012 FINDINGS: Frontal and lateral views were obtained. There is mid to lower thoracic dextroscoliosis. No fracture or spondylolisthesis. There is disc space narrowing at multiple levels with multiple small anterior osteophytes. No erosive change or paraspinous lesions. IMPRESSION: Scoliosis  with disc narrowing at multiple levels consistent with a degree of osteoarthritic change. No fracture or spondylolisthesis. Electronically Signed   By: Lowella Grip III M.D.   On: 04/26/2016 19:36    Assessment & Plan:   There are no diagnoses linked to this encounter. I am having Mr. Pianka maintain his multivitamin, latanoprost, Omega-3 Fatty Acids (FISH OIL PO), Glucosamine-Chondroit-Vit C-Mn (GLUCOSAMINE 1500 COMPLEX PO), Vitamin D3, simvastatin, omeprazole, LORazepam, erythromycin, traMADol, tiZANidine, and simvastatin.  No orders of the defined types were placed in this encounter.    Follow-up: No Follow-up on file.  Walker Kehr, MD

## 2016-07-08 NOTE — Progress Notes (Signed)
Pre visit review using our clinic review tool, if applicable. No additional management support is needed unless otherwise documented below in the visit note. 

## 2016-07-08 NOTE — Assessment & Plan Note (Signed)
Lipase, LFTs Hold Simvastatin

## 2016-07-08 NOTE — Patient Instructions (Addendum)
Do not take Simvastatin Go to ER if worse

## 2016-07-13 ENCOUNTER — Other Ambulatory Visit: Payer: Self-pay | Admitting: Internal Medicine

## 2016-07-17 NOTE — Telephone Encounter (Signed)
called refill into pharmacy spoke w/charlie gave md approval.../lmb

## 2016-07-18 ENCOUNTER — Telehealth: Payer: Self-pay | Admitting: Internal Medicine

## 2016-07-18 NOTE — Telephone Encounter (Signed)
Pt is scheduled for his CT on 2/13. Radiologist is requesting labs for BUN and Creatinin. He will be coming in tomorrow (Friday) to pick up contrast and get labs done.   He is leaving town on Thursday and is hoping for the results on Weds since his appt isn't until 4pm.  Please advise

## 2016-07-18 NOTE — Telephone Encounter (Signed)
OK BMET Thx

## 2016-07-19 ENCOUNTER — Telehealth: Payer: Self-pay

## 2016-07-19 ENCOUNTER — Other Ambulatory Visit (INDEPENDENT_AMBULATORY_CARE_PROVIDER_SITE_OTHER): Payer: PPO

## 2016-07-19 DIAGNOSIS — R109 Unspecified abdominal pain: Secondary | ICD-10-CM

## 2016-07-19 LAB — BASIC METABOLIC PANEL
BUN: 16 mg/dL (ref 6–23)
CO2: 30 mEq/L (ref 19–32)
Calcium: 9.1 mg/dL (ref 8.4–10.5)
Chloride: 106 mEq/L (ref 96–112)
Creatinine, Ser: 1.08 mg/dL (ref 0.40–1.50)
GFR: 70.33 mL/min (ref >60.00)
Glucose, Bld: 94 mg/dL (ref 70–99)
POTASSIUM: 4.3 meq/L (ref 3.5–5.1)
SODIUM: 141 meq/L (ref 135–145)

## 2016-07-19 NOTE — Telephone Encounter (Signed)
-----   Message from Octavio Manns sent at 07/18/2016  3:58 PM EST ----- I asked Dr. Camila Li to put some labs in for this pt's ct and he said ok and didn't put them in. He is planning on coming in tomorrow 2/9 to get them done. Can you Please put them in for me

## 2016-07-19 NOTE — Telephone Encounter (Signed)
Lab ordered placed

## 2016-07-22 DIAGNOSIS — H43813 Vitreous degeneration, bilateral: Secondary | ICD-10-CM | POA: Diagnosis not present

## 2016-07-22 DIAGNOSIS — H401131 Primary open-angle glaucoma, bilateral, mild stage: Secondary | ICD-10-CM | POA: Diagnosis not present

## 2016-07-22 DIAGNOSIS — Z961 Presence of intraocular lens: Secondary | ICD-10-CM | POA: Diagnosis not present

## 2016-07-23 ENCOUNTER — Ambulatory Visit (INDEPENDENT_AMBULATORY_CARE_PROVIDER_SITE_OTHER)
Admission: RE | Admit: 2016-07-23 | Discharge: 2016-07-23 | Disposition: A | Discharge status: Home / Self Care | Payer: PPO | Source: Ambulatory Visit | Attending: Internal Medicine | Admitting: Internal Medicine

## 2016-07-23 DIAGNOSIS — R1010 Upper abdominal pain, unspecified: Secondary | ICD-10-CM

## 2016-07-23 DIAGNOSIS — R1013 Epigastric pain: Secondary | ICD-10-CM | POA: Diagnosis not present

## 2016-07-23 MED ORDER — IOPAMIDOL (ISOVUE-300) INJECTION 61%
100.0000 mL | Freq: Once | INTRAVENOUS | Status: AC | PRN
  Administered 2016-07-23: 100 mL via INTRAVENOUS

## 2016-08-15 ENCOUNTER — Ambulatory Visit (INDEPENDENT_AMBULATORY_CARE_PROVIDER_SITE_OTHER): Payer: PPO | Admitting: Gastroenterology

## 2016-08-15 ENCOUNTER — Encounter: Payer: Self-pay | Admitting: Gastroenterology

## 2016-08-15 VITALS — BP 118/60 | HR 70 | Ht 67.0 in | Wt 166.0 lb

## 2016-08-15 DIAGNOSIS — R140 Abdominal distension (gaseous): Secondary | ICD-10-CM

## 2016-08-15 DIAGNOSIS — G8929 Other chronic pain: Secondary | ICD-10-CM

## 2016-08-15 DIAGNOSIS — R1013 Epigastric pain: Secondary | ICD-10-CM | POA: Diagnosis not present

## 2016-08-15 DIAGNOSIS — K219 Gastro-esophageal reflux disease without esophagitis: Secondary | ICD-10-CM | POA: Diagnosis not present

## 2016-08-15 DIAGNOSIS — R194 Change in bowel habit: Secondary | ICD-10-CM | POA: Diagnosis not present

## 2016-08-15 NOTE — Patient Instructions (Signed)
Food Guidelines for gas and bloating  Many people have difficulty digesting certain foods, causing a variety of distressing and embarrassing symptoms such as abdominal pain, bloating and gas.  These foods may need to be avoided or consumed in small amounts.  Here are some tips that might be helpful for you.  1.   Lactose intolerance is the difficulty or complete inability to digest lactose, the natural sugar in milk and anything made from milk.  This condition is harmless, common, and can begin any time during life.  Some people can digest a modest amount of lactose while others cannot tolerate any.  Also, not all dairy products contain equal amounts of lactose.  For example, hard cheeses such as parmesan have less lactose than soft cheeses such as cheddar.  Yogurt has less lactose than milk or cheese.  Many packaged foods (even many brands of bread) have milk, so read ingredient lists carefully.  It is difficult to test for lactose intolerance, so just try avoiding lactose as much as possible for a week and see what happens with your symptoms.  If you seem to be lactose intolerant, the best plan is to avoid it (but make sure you get calcium from another source).  The next best thing is to use lactase enzyme supplements, available over the counter everywhere.  Just know that many lactose intolerant people need to take several tablets with each serving of dairy to avoid symptoms.  Lastly, a lot of restaurant food is made with milk or butter.  Many are things you might not suspect, such as mashed potatoes, rice and pasta (cooked with butter) and "grilled" items.  If you are lactose intolerant, it never hurts to ask your server what has milk or butter.  2.   Fiber is an important part of your diet, but not all fiber is well-tolerated.  Insoluble fiber such as bran is often consumed by normal gut bacteria and converted into gas.  Soluble fiber such as oats, squash, carrots and green beans are typically tolerated  better.  3.   Some types of carbohydrates can be poorly digested.  Examples include: fructose (apples, cherries, pears, raisins and other dried fruits), fructans (onions, zucchini, large amounts of wheat), sorbitol/mannitol/xylitol and sucralose/Splenda (common artificial sweeteners), and raffinose (lentils, broccoli, cabbage, asparagus, brussel sprouts, many types of beans).  Do a Development worker, community for [employer] and you will find helpful information. Beano, a dietary supplement, will often help with raffinose-containing foods.  As with lactase tablets, you may need several per serving.  4.   Whenever possible, avoid processed food&meats and chemical additives.  High fructose corn syrup, a common sweetener, may be difficult to digest.  Eggs and soy (comes from the soybean, and added to many foods now) are the other most common bloating/gassy foods.  - Dr. Herma Ard [facility]

## 2016-08-15 NOTE — Progress Notes (Signed)
[facility] Consult Note:  History: Edwin Ramirez 08/15/2016  Referring physician: Walker Kehr, MD  Reason for consult/chief complaint: Gas (pockets of this in his abdomen, belching and passing gas, has had a CT)   Subjective  HPI:  This is a 76 year old man referred by primary care for abdominal pain. He previously saw Dr. Olevia Perches for GERD, and had both colonoscopy and upper endoscopy in 2014. He reports that for about the last 2 months he has had an intermittent bandlike feeling of upper abdominal pressure that will last for a day or 2 and then subsided for a few days. Sometimes he feels it more in the right lower quadrant and it feels like a bloating or cramps. For many years he has tended toward alternating bowel habits. There's been no change in that recently, and he denies rectal bleeding. His appetite is been good and his weight stable. He still has intermittent pyrosis, but there's been no change with that, and he denies dysphagia, odynophagia, early satiety, nausea, vomiting or weight change. He had no other changes in his health or new medicines with the onset of these symptoms.   ROS:  Review of Systems  Constitutional: Negative for appetite change and unexpected weight change.  HENT: Negative for mouth sores and voice change.   Eyes: Negative for pain and redness.  Respiratory: Negative for cough and shortness of breath.   Cardiovascular: Negative for chest pain and palpitations.  Genitourinary: Negative for dysuria and hematuria.  Musculoskeletal: Positive for back pain. Negative for arthralgias and myalgias.  Skin: Negative for pallor and rash.  Neurological: Negative for weakness and headaches.  Hematological: Negative for adenopathy.  Psychiatric/Behavioral: The patient is nervous/anxious.      Past Medical History: Past Medical History:  Diagnosis Date  . Basal cell cancer    Dr Ubaldo Glassing  . DDD (degenerative disc disease)   . Diverticulosis    . Esophageal stricture    Dr Olevia Perches  . Gastritis   . GERD (gastroesophageal reflux disease)   . Gilbert syndrome   . Glaucoma   . Hepatic cyst   . History of prostate cancer    Dr Alinda Money  . Hx of adenomatous colonic polyps   . Hyperlipidemia   . Internal hemorrhoids   . Pulmonary nodule    not seen on F/U 2014  . TMJ (dislocation of temporomandibular joint)      Past Surgical History: Past Surgical History:  Procedure Laterality Date  . APPENDECTOMY    . cataract surgery  2012   bilateral  . CHOLECYSTECTOMY  1985  . COLONOSCOPY W/ POLYPECTOMY  2004   Negative 2009 & 2013 Dr.Brodie  . ESOPHAGEAL DILATION  2004  . MOHS SURGERY     Basal Cell  . PROSTATECTOMY  07/2005   Dr.Borden  . ROTATOR CUFF REPAIR      X 2-Left; Dr Percell Miller     Family History: Family History  Problem Relation Age of Onset  . Multiple myeloma Father   . Colon cancer Mother 30  . Heart failure Mother   . Coronary artery disease Brother   . Heart attack Maternal Uncle 65  . Diabetes Neg Hx   . Stroke Neg Hx   . Esophageal cancer Neg Hx   . Rectal cancer Neg Hx   . Stomach cancer Neg Hx     Social History: Social History   Social History  . Marital status: Married    Spouse name: N/A  . Number of children: 2  .  Years of education: N/A   Occupational History  . retired    Social History Main Topics  . Smoking status: Never Smoker  . Smokeless tobacco: Never Used  . Alcohol use 3.0 oz/week    5 Glasses of wine per week     Comment: Socially  . Drug use: No  . Sexual activity: Not Asked   Other Topics Concern  . None   Social History Narrative   DAILY CAFFEINE    Allergies: Allergies  Allergen Reactions  . Codeine     REACTION: agitation, mental status changes with high doses of codeine post op  . Nabumetone     Mental status changes post op with Relafen  . Aspirin     gastritis    Outpatient Meds: Current Outpatient Prescriptions  Medication Sig Dispense Refill   . Cholecalciferol (VITAMIN D3) 2000 UNITS capsule Take 1 capsule (2,000 Units total) by mouth daily. 100 capsule 3  . latanoprost (XALATAN) 0.005 % ophthalmic solution Place 1 drop into both eyes at bedtime.     Marland Kitchen LORazepam (ATIVAN) 1 MG tablet TAKE 1/2 TABLET BY MOUTH EVERY 8 HOURS AS NEEDED 90 tablet 2  . Multiple Vitamin (MULTIVITAMIN) tablet Take 1 tablet by mouth daily.      . Omega-3 Fatty Acids (FISH OIL PO) Take 1 capsule by mouth daily.     Marland Kitchen omeprazole (PRILOSEC) 40 MG capsule Take 1 capsule (40 mg total) by mouth 2 (two) times daily. 180 capsule 3  . Simethicone 125 MG CAPS Take by mouth. Takes one capsule after meals and at bedtime    . simvastatin (ZOCOR) 20 MG tablet Take 1 tablet (20 mg total) by mouth at bedtime. 90 tablet 3  . tiZANidine (ZANAFLEX) 4 MG tablet Take 1 tablet (4 mg total) by mouth every 6 (six) hours as needed for muscle spasms. 40 tablet 1  . traMADol (ULTRAM) 50 MG tablet Take 1 tablet (50 mg total) by mouth every 8 (eight) hours as needed for severe pain. 60 tablet 0   No current facility-administered medications for this visit.       ___________________________________________________________________ Objective   Exam:  BP 118/60   Pulse 70   Ht _0  (1.702 m)   Wt 166 lb (75.3 kg)   BMI 26.00 kg/m    General: this is a(n) Well-appearing man, alert, conversational and appropriate with no muscle wasting.   Eyes: sclera anicteric, no redness  ENT: oral mucosa moist without lesions, no cervical or supraclavicular lymphadenopathy, good dentition  CV: RRR without murmur, S1/S2, no JVD, no peripheral edema  Resp: clear to auscultation bilaterally, normal RR and effort noted  GI: soft, mild scattered tenderness, with active bowel sounds. No guarding or palpable organomegaly noted.  Skin; warm and dry, no rash or jaundice noted  Neuro: awake, alert and oriented x 3. Normal gross motor function and fluent speech  Labs:  nml bmp, LFTs, CBC  within last 6 weeks  Radiologic Studies:  CTAP 07/23/16 nml except few chronic findings (mild IHBD, seen prior, pancreatic atrophy) Images were personally reviewed Assessment: Encounter Diagnoses  Name Primary?  . Bloating Yes  . Abdominal pain, chronic, epigastric   . Gastroesophageal reflux disease without esophagitis   . 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 He has no red flag symptoms, his workup thus far is been reassuring. I  think an endoscopic workup would be of very low yield in this scenario.  We discussed the typical contribution of diet, also the benign nature of these symptoms and how he should return to see me if he develops worsening pain, rectal bleeding, dysphagia vomiting or weight loss. He is up-to-date on colorectal cancer screening Plan:  Written dietary advice given As needed MiraLAX for constipation  Thank you for the courtesy of this consult.  Please call me with any questions or concerns.  Nelida Meuse III  CC: Walker Kehr, MD

## 2016-08-24 ENCOUNTER — Other Ambulatory Visit: Payer: Self-pay | Admitting: Internal Medicine

## 2016-08-29 ENCOUNTER — Ambulatory Visit (INDEPENDENT_AMBULATORY_CARE_PROVIDER_SITE_OTHER): Payer: PPO | Admitting: Internal Medicine

## 2016-08-29 ENCOUNTER — Encounter: Payer: Self-pay | Admitting: Internal Medicine

## 2016-08-29 DIAGNOSIS — E782 Mixed hyperlipidemia: Secondary | ICD-10-CM

## 2016-08-29 DIAGNOSIS — M5440 Lumbago with sciatica, unspecified side: Secondary | ICD-10-CM | POA: Diagnosis not present

## 2016-08-29 DIAGNOSIS — R1010 Upper abdominal pain, unspecified: Secondary | ICD-10-CM | POA: Diagnosis not present

## 2016-08-29 MED ORDER — ROSUVASTATIN CALCIUM 10 MG PO TABS: 10.0000 mg | ORAL_TABLET | Freq: Every day | ORAL | 11 refills | Status: DC

## 2016-08-29 NOTE — Assessment & Plan Note (Signed)
Will try to switch to Crestor

## 2016-08-29 NOTE — Assessment & Plan Note (Signed)
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

## 2016-08-29 NOTE — Progress Notes (Signed)
Pre-visit discussion using our clinic review tool. No additional management support is needed unless otherwise documented below in the visit note.  

## 2016-08-29 NOTE — Progress Notes (Signed)
Subjective:  Patient ID: Edwin Ramirez, male    DOB: 08-30-1940  Age: 76 y.o. MRN: [id number]  CC: Follow-up (GERD, Low back pain, hyperlipidemia )   HPI Edwin Ramirez presents for abd pain f/u - 90% better. F/u dyslipidemia, LBP  Outpatient Medications Prior to Visit  Medication Sig Dispense Refill  . Cholecalciferol (VITAMIN D3) 2000 UNITS capsule Take 1 capsule (2,000 Units total) by mouth daily. 100 capsule 3  . latanoprost (XALATAN) 0.005 % ophthalmic solution Place 1 drop into both eyes at bedtime.     Marland Kitchen LORazepam (ATIVAN) 1 MG tablet TAKE 1/2 TABLET BY MOUTH EVERY 8 HOURS AS NEEDED 90 tablet 2  . Multiple Vitamin (MULTIVITAMIN) tablet Take 1 tablet by mouth daily.      . Omega-3 Fatty Acids (FISH OIL PO) Take 1 capsule by mouth daily.     Marland Kitchen omeprazole (PRILOSEC) 40 MG capsule TAKE 1 CAPSULE BY MOUTH TWICE DAILY 180 capsule 2  . Simethicone 125 MG CAPS Take by mouth. Takes one capsule after meals and at bedtime    . simvastatin (ZOCOR) 20 MG tablet Take 1 tablet (20 mg total) by mouth at bedtime. 90 tablet 3  . tiZANidine (ZANAFLEX) 4 MG tablet Take 1 tablet (4 mg total) by mouth every 6 (six) hours as needed for muscle spasms. 40 tablet 1  . traMADol (ULTRAM) 50 MG tablet Take 1 tablet (50 mg total) by mouth every 8 (eight) hours as needed for severe pain. 60 tablet 0   No facility-administered medications prior to visit.     ROS Review of Systems  Constitutional: Negative for appetite change, fatigue and unexpected weight change.  HENT: Negative for congestion, nosebleeds, sneezing, sore throat and trouble swallowing.   Eyes: Negative for itching and visual disturbance.  Respiratory: Negative for cough.   Cardiovascular: Negative for chest pain, palpitations and leg swelling.  Gastrointestinal: Positive for abdominal pain. Negative for abdominal distention, blood in stool, diarrhea and nausea.  Genitourinary: Negative for frequency and hematuria.    Musculoskeletal: Negative for back pain, gait problem, joint swelling and neck pain.  Skin: Negative for rash.  Neurological: Negative for dizziness, tremors, speech difficulty and weakness.  Psychiatric/Behavioral: Negative for agitation, dysphoric mood and sleep disturbance. The patient is not nervous/anxious.     Objective:  BP (!) 188/60   Pulse (!) 58   Temp 98.4 F (36.9 C) (Oral)   Resp 16   Ht 5\' 7"  (1.702 m)   Wt 169 lb 4 oz (76.8 kg)   SpO2 96%   BMI 26.51 kg/m   BP Readings from Last 3 Encounters:  08/29/16 (!) 188/60  08/15/16 118/60  07/08/16 136/80    Wt Readings from Last 3 Encounters:  08/29/16 169 lb 4 oz (76.8 kg)  08/15/16 166 lb (75.3 kg)  07/08/16 169 lb (76.7 kg)    Physical Exam  Constitutional: He is oriented to person, place, and time. He appears well-developed. No distress.  NAD  HENT:  Mouth/Throat: Oropharynx is clear and moist.  Eyes: Conjunctivae are normal. Pupils are equal, round, and reactive to light.  Neck: Normal range of motion. No JVD present. No thyromegaly present.  Cardiovascular: Normal rate, regular rhythm, normal heart sounds and intact distal pulses.  Exam reveals no gallop and no friction rub.   No murmur heard. Pulmonary/Chest: Effort normal and breath sounds normal. No respiratory distress. He has no wheezes. He has no rales. He exhibits no tenderness.  Abdominal: Soft. Bowel sounds are  normal. He exhibits no distension and no mass. There is tenderness. There is no rebound and no guarding.  Musculoskeletal: Normal range of motion. He exhibits no edema or tenderness.  Lymphadenopathy:    He has no cervical adenopathy.  Neurological: He is alert and oriented to person, place, and time. He has normal reflexes. No cranial nerve deficit. He exhibits normal muscle tone. He displays a negative Romberg sign. Coordination and gait normal.  Skin: Skin is warm and dry. No rash noted.  Psychiatric: He has a normal mood and affect.  His behavior is normal. Judgment and thought content normal.    Lab Results  Component Value Date   WBC 5.5 07/08/2016   HGB 14.9 07/08/2016   HCT 44.3 07/08/2016   PLT 162.0 07/08/2016   GLUCOSE 94 07/19/2016   CHOL 168 03/01/2016   TRIG 128.0 03/01/2016   HDL 47.20 03/01/2016   LDLDIRECT 78.8 02/05/2011   LDLCALC 95 03/01/2016   ALT 21 07/08/2016   AST 22 07/08/2016   NA 141 07/19/2016   K 4.3 07/19/2016   CL 106 07/19/2016   CREATININE 1.08 07/19/2016   BUN 16 07/19/2016   CO2 30 07/19/2016   TSH 0.68 03/01/2016   PSA 0.00 (L) 03/01/2016   HGBA1C 5.7 03/07/2014    Ct Abdomen Pelvis W Contrast  Result Date: 07/23/2016 CLINICAL DATA:  Epigastric pain with nausea x1 month. History of cholecystectomy, prostatectomy and appendectomy. EXAM: CT ABDOMEN AND PELVIS WITH CONTRAST TECHNIQUE: Multidetector CT imaging of the abdomen and pelvis was performed using the standard protocol following bolus administration of intravenous contrast. CONTRAST:  137mL ISOVUE-300 IOPAMIDOL (ISOVUE-300) INJECTION 61% COMPARISON:  Abdomen radiographs 02/13/2016, CT abdomen and pelvis from 05/23/2008 FINDINGS: Lower chest: Top normal-sized cardiac chambers. No acute pulmonary disease. Hepatobiliary: Cholecystectomy. Mild intrahepatic ductal dilatation which can be seen in the setting of prior cholecystectomy. No space-occupying mass of the liver. No choledocholithiasis. Pancreas: Atrophic pancreas without ductal dilatation or mass. Spleen: Spleen is normal size without space-occupying mass. Adrenals/Urinary Tract: Normal bilateral adrenal glands and kidneys without obstructive uropathy. Symmetric pyelograms bilaterally on repeat delayed imaging through the kidneys. Ureters are unremarkable. Bladder is nondistended. Stomach/Bowel: Contracted stomach. Normal small bowel rotation without acute obstruction or inflammation. Moderate colonic stool burden with scattered colonic diverticulosis. No acute diverticulitis.  Appendectomy. Vascular/Lymphatic: Aortoiliac atherosclerosis. No aneurysm or dissection. Reproductive: Prostatectomy. Other: No abdominal wall hernia or abnormality. No abdominopelvic ascites. Musculoskeletal: Mild disc space narrowing L3-4 and L5-S1. No acute osseous abnormality. IMPRESSION: 1. No acute bowel inflammation or obstruction. Scattered colonic diverticulosis without diverticulitis. 2. Cholecystectomy, appendectomy and prostatectomy. 3. Degenerative disc disease L3-4 and L5-S1. No acute osseous abnormality. Electronically Signed   By: Ashley Royalty M.D.   On: 07/23/2016 21:03    Assessment & Plan:   There are no diagnoses linked to this encounter. I am having Mr. Pineiro maintain his multivitamin, latanoprost, Omega-3 Fatty Acids (FISH OIL PO), Vitamin D3, simvastatin, traMADol, tiZANidine, LORazepam, Simethicone, omeprazole, and polyethylene glycol powder.  Meds ordered this encounter  Medications  . polyethylene glycol powder (GLYCOLAX/MIRALAX) powder    Sig: Take 1 Container by mouth once.     Follow-up: No Follow-up on file.  Walker Kehr, MD

## 2016-08-29 NOTE — Assessment & Plan Note (Signed)
90% better Miralax, Gas-ex helped We may need to change from Simvastatin to Crestor

## 2016-09-11 DIAGNOSIS — D1801 Hemangioma of skin and subcutaneous tissue: Secondary | ICD-10-CM | POA: Diagnosis not present

## 2016-09-11 DIAGNOSIS — L821 Other seborrheic keratosis: Secondary | ICD-10-CM | POA: Diagnosis not present

## 2016-09-11 DIAGNOSIS — L570 Actinic keratosis: Secondary | ICD-10-CM | POA: Diagnosis not present

## 2016-09-11 DIAGNOSIS — Z85828 Personal history of other malignant neoplasm of skin: Secondary | ICD-10-CM | POA: Diagnosis not present

## 2016-09-11 DIAGNOSIS — D692 Other nonthrombocytopenic purpura: Secondary | ICD-10-CM | POA: Diagnosis not present

## 2016-11-20 ENCOUNTER — Other Ambulatory Visit (INDEPENDENT_AMBULATORY_CARE_PROVIDER_SITE_OTHER): Payer: PPO

## 2016-11-20 ENCOUNTER — Encounter: Payer: Self-pay | Admitting: Internal Medicine

## 2016-11-20 ENCOUNTER — Ambulatory Visit (INDEPENDENT_AMBULATORY_CARE_PROVIDER_SITE_OTHER): Payer: PPO | Admitting: Internal Medicine

## 2016-11-20 DIAGNOSIS — R1010 Upper abdominal pain, unspecified: Secondary | ICD-10-CM

## 2016-11-20 DIAGNOSIS — E785 Hyperlipidemia, unspecified: Secondary | ICD-10-CM | POA: Diagnosis not present

## 2016-11-20 DIAGNOSIS — K219 Gastro-esophageal reflux disease without esophagitis: Secondary | ICD-10-CM | POA: Diagnosis not present

## 2016-11-20 LAB — BASIC METABOLIC PANEL
BUN: 17 mg/dL (ref 6–23)
CHLORIDE: 106 meq/L (ref 96–112)
CO2: 29 mEq/L (ref 19–32)
CREATININE: 1.15 mg/dL (ref 0.40–1.50)
Calcium: 9.8 mg/dL (ref 8.4–10.5)
GFR: 65.35 mL/min (ref >60.00)
Glucose, Bld: 100 mg/dL — ABNORMAL HIGH (ref 70–99)
POTASSIUM: 4.7 meq/L (ref 3.5–5.1)
Sodium: 140 mEq/L (ref 135–145)

## 2016-11-20 LAB — LIPID PANEL
CHOLESTEROL: 134 mg/dL (ref 0–200)
HDL: 37.9 mg/dL — ABNORMAL LOW (ref >39.00)
LDL CALC: 69 mg/dL (ref 0–99)
NonHDL: 96.28
TRIGLYCERIDES: 137 mg/dL (ref 0.0–149.0)
Total CHOL/HDL Ratio: 4
VLDL: 27.4 mg/dL (ref 0.0–40.0)

## 2016-11-20 LAB — HEPATIC FUNCTION PANEL
ALT: 19 U/L (ref 0–53)
AST: 22 U/L (ref 0–37)
Albumin: 4.4 g/dL (ref 3.5–5.2)
Alkaline Phosphatase: 61 U/L (ref 39–117)
BILIRUBIN TOTAL: 1.1 mg/dL (ref 0.2–1.2)
Bilirubin, Direct: 0.2 mg/dL (ref 0.0–0.3)
TOTAL PROTEIN: 7 g/dL (ref 6.0–8.3)

## 2016-11-20 MED ORDER — ROSUVASTATIN CALCIUM 10 MG PO TABS: 10.0000 mg | ORAL_TABLET | Freq: Every day | ORAL | 3 refills | Status: DC

## 2016-11-20 NOTE — Assessment & Plan Note (Signed)
Crestor Labs 

## 2016-11-20 NOTE — Progress Notes (Signed)
Subjective:  Patient ID: Edwin Ramirez, male    DOB: 1941/04/06  Age: 76 y.o. MRN: [id number]  CC: No chief complaint on file.   HPI Edwin Ramirez presents for GERD, anxiety, dyslipidemia f/u. Constipation is ok  Outpatient Medications Prior to Visit  Medication Sig Dispense Refill  . Cholecalciferol (VITAMIN D3) 2000 UNITS capsule Take 1 capsule (2,000 Units total) by mouth daily. 100 capsule 3  . latanoprost (XALATAN) 0.005 % ophthalmic solution Place 1 drop into both eyes at bedtime.     Marland Kitchen LORazepam (ATIVAN) 1 MG tablet TAKE 1/2 TABLET BY MOUTH EVERY 8 HOURS AS NEEDED 90 tablet 2  . Multiple Vitamin (MULTIVITAMIN) tablet Take 1 tablet by mouth daily.      . Omega-3 Fatty Acids (FISH OIL PO) Take 1 capsule by mouth daily.     Marland Kitchen omeprazole (PRILOSEC) 40 MG capsule TAKE 1 CAPSULE BY MOUTH TWICE DAILY 180 capsule 2  . polyethylene glycol powder (GLYCOLAX/MIRALAX) powder Take 1 Container by mouth once.    . rosuvastatin (CRESTOR) 10 MG tablet Take 1 tablet (10 mg total) by mouth daily. 30 tablet 11  . Simethicone 125 MG CAPS Take by mouth. Takes one capsule after meals and at bedtime    . simvastatin (ZOCOR) 20 MG tablet Take 1 tablet (20 mg total) by mouth at bedtime. 90 tablet 3  . tiZANidine (ZANAFLEX) 4 MG tablet Take 1 tablet (4 mg total) by mouth every 6 (six) hours as needed for muscle spasms. 40 tablet 1  . traMADol (ULTRAM) 50 MG tablet Take 1 tablet (50 mg total) by mouth every 8 (eight) hours as needed for severe pain. 60 tablet 0   No facility-administered medications prior to visit.     ROS Review of Systems  Constitutional: Negative for appetite change, fatigue and unexpected weight change.  HENT: Negative for congestion, nosebleeds, sneezing, sore throat and trouble swallowing.   Eyes: Negative for itching and visual disturbance.  Respiratory: Negative for cough.   Cardiovascular: Negative for chest pain, palpitations and leg swelling.  Gastrointestinal:  Negative for abdominal distention, blood in stool, diarrhea and nausea.  Genitourinary: Negative for frequency and hematuria.  Musculoskeletal: Negative for back pain, gait problem, joint swelling and neck pain.  Skin: Negative for rash.  Neurological: Negative for dizziness, tremors, speech difficulty and weakness.  Psychiatric/Behavioral: Negative for agitation, dysphoric mood, sleep disturbance and suicidal ideas. The patient is not nervous/anxious.     Objective:  BP 122/68 (BP Location: Left Arm, Patient Position: Sitting, Cuff Size: Normal)   Pulse (!) 55   Temp 97.7 F (36.5 C) (Oral)   Ht 5\' 7"  (1.702 m)   Wt 162 lb (73.5 kg)   SpO2 98%   BMI 25.37 kg/m   BP Readings from Last 3 Encounters:  11/20/16 122/68  08/29/16 118/60  08/15/16 118/60    Wt Readings from Last 3 Encounters:  11/20/16 162 lb (73.5 kg)  08/29/16 169 lb 4 oz (76.8 kg)  08/15/16 166 lb (75.3 kg)    Physical Exam  Constitutional: He is oriented to person, place, and time. He appears well-developed. No distress.  NAD  HENT:  Mouth/Throat: Oropharynx is clear and moist.  Eyes: Conjunctivae are normal. Pupils are equal, round, and reactive to light.  Neck: Normal range of motion. No JVD present. No thyromegaly present.  Cardiovascular: Normal rate, regular rhythm, normal heart sounds and intact distal pulses.  Exam reveals no gallop and no friction rub.   No murmur  heard. Pulmonary/Chest: Effort normal and breath sounds normal. No respiratory distress. He has no wheezes. He has no rales. He exhibits no tenderness.  Abdominal: Soft. Bowel sounds are normal. He exhibits no distension and no mass. There is no tenderness. There is no rebound and no guarding.  Musculoskeletal: Normal range of motion. He exhibits no edema or tenderness.  Lymphadenopathy:    He has no cervical adenopathy.  Neurological: He is alert and oriented to person, place, and time. He has normal reflexes. No cranial nerve deficit. He  exhibits normal muscle tone. He displays a negative Romberg sign. Coordination and gait normal.  Skin: Skin is warm and dry. No rash noted.  Psychiatric: He has a normal mood and affect. His behavior is normal. Judgment and thought content normal.    Lab Results  Component Value Date   WBC 5.5 07/08/2016   HGB 14.9 07/08/2016   HCT 44.3 07/08/2016   PLT 162.0 07/08/2016   GLUCOSE 94 07/19/2016   CHOL 168 03/01/2016   TRIG 128.0 03/01/2016   HDL 47.20 03/01/2016   LDLDIRECT 78.8 02/05/2011   LDLCALC 95 03/01/2016   ALT 21 07/08/2016   AST 22 07/08/2016   NA 141 07/19/2016   K 4.3 07/19/2016   CL 106 07/19/2016   CREATININE 1.08 07/19/2016   BUN 16 07/19/2016   CO2 30 07/19/2016   TSH 0.68 03/01/2016   PSA 0.00 (L) 03/01/2016   HGBA1C 5.7 03/07/2014    Ct Abdomen Pelvis W Contrast  Result Date: 07/23/2016 CLINICAL DATA:  Epigastric pain with nausea x1 month. History of cholecystectomy, prostatectomy and appendectomy. EXAM: CT ABDOMEN AND PELVIS WITH CONTRAST TECHNIQUE: Multidetector CT imaging of the abdomen and pelvis was performed using the standard protocol following bolus administration of intravenous contrast. CONTRAST:  157mL ISOVUE-300 IOPAMIDOL (ISOVUE-300) INJECTION 61% COMPARISON:  Abdomen radiographs 02/13/2016, CT abdomen and pelvis from 05/23/2008 FINDINGS: Lower chest: Top normal-sized cardiac chambers. No acute pulmonary disease. Hepatobiliary: Cholecystectomy. Mild intrahepatic ductal dilatation which can be seen in the setting of prior cholecystectomy. No space-occupying mass of the liver. No choledocholithiasis. Pancreas: Atrophic pancreas without ductal dilatation or mass. Spleen: Spleen is normal size without space-occupying mass. Adrenals/Urinary Tract: Normal bilateral adrenal glands and kidneys without obstructive uropathy. Symmetric pyelograms bilaterally on repeat delayed imaging through the kidneys. Ureters are unremarkable. Bladder is nondistended.  Stomach/Bowel: Contracted stomach. Normal small bowel rotation without acute obstruction or inflammation. Moderate colonic stool burden with scattered colonic diverticulosis. No acute diverticulitis. Appendectomy. Vascular/Lymphatic: Aortoiliac atherosclerosis. No aneurysm or dissection. Reproductive: Prostatectomy. Other: No abdominal wall hernia or abnormality. No abdominopelvic ascites. Musculoskeletal: Mild disc space narrowing L3-4 and L5-S1. No acute osseous abnormality. IMPRESSION: 1. No acute bowel inflammation or obstruction. Scattered colonic diverticulosis without diverticulitis. 2. Cholecystectomy, appendectomy and prostatectomy. 3. Degenerative disc disease L3-4 and L5-S1. No acute osseous abnormality. Electronically Signed   By: Ashley Royalty M.D.   On: 07/23/2016 21:03    Assessment & Plan:   There are no diagnoses linked to this encounter. I am having Mr. Delay maintain his multivitamin, latanoprost, Omega-3 Fatty Acids (FISH OIL PO), Vitamin D3, simvastatin, traMADol, tiZANidine, LORazepam, Simethicone, omeprazole, polyethylene glycol powder, and rosuvastatin.  No orders of the defined types were placed in this encounter.    Follow-up: No Follow-up on file.  Walker Kehr, MD

## 2016-11-20 NOTE — Assessment & Plan Note (Signed)
Resolved w/Miralax 

## 2016-11-20 NOTE — Assessment & Plan Note (Signed)
Omeprazole

## 2016-11-25 ENCOUNTER — Encounter: Payer: Self-pay | Admitting: Family Medicine

## 2016-11-25 ENCOUNTER — Ambulatory Visit (INDEPENDENT_AMBULATORY_CARE_PROVIDER_SITE_OTHER): Payer: PPO | Admitting: Family Medicine

## 2016-11-25 VITALS — BP 128/78 | HR 60 | Temp 97.7°F | Wt 162.0 lb

## 2016-11-25 DIAGNOSIS — W57XXXA Bitten or stung by nonvenomous insect and other nonvenomous arthropods, initial encounter: Secondary | ICD-10-CM

## 2016-11-25 DIAGNOSIS — S80862A Insect bite (nonvenomous), left lower leg, initial encounter: Secondary | ICD-10-CM

## 2016-11-25 DIAGNOSIS — L03115 Cellulitis of right lower limb: Secondary | ICD-10-CM | POA: Diagnosis not present

## 2016-11-25 DIAGNOSIS — S80861A Insect bite (nonvenomous), right lower leg, initial encounter: Secondary | ICD-10-CM | POA: Diagnosis not present

## 2016-11-25 MED ORDER — DOXYCYCLINE HYCLATE 100 MG PO CAPS: 100.0000 mg | ORAL_CAPSULE | Freq: Two times a day (BID) | ORAL | 0 refills | Status: DC

## 2016-11-25 NOTE — Patient Instructions (Signed)
Cellulitis, Adult Cellulitis is a skin infection. The infected area is usually red and tender. This condition occurs most often in the arms and lower legs. The infection can travel to the muscles, blood, and underlying tissue and become serious. It is very important to get treated for this condition. What are the causes? Cellulitis is caused by bacteria. The bacteria enter through a break in the skin, such as a cut, burn, insect bite, open sore, or crack. What increases the risk? This condition is more likely to occur in people who:  Have a weak defense system (immune system).  Have open wounds on the skin such as cuts, burns, bites, and scrapes. Bacteria can enter the body through these open wounds.  Are older.  Have diabetes.  Have a type of long-lasting (chronic) liver disease (cirrhosis) or kidney disease.  Use IV drugs.  What are the signs or symptoms? Symptoms of this condition include:  Redness, streaking, or spotting on the skin.  Swollen area of the skin.  Tenderness or pain when an area of the skin is touched.  Warm skin.  Fever.  Chills.  Blisters.  How is this diagnosed? This condition is diagnosed based on a medical history and physical exam. You may also have tests, including:  Blood tests.  Lab tests.  Imaging tests.  How is this treated? Treatment for this condition may include:  Medicines, such as antibiotic medicines or antihistamines.  Supportive care, such as rest and application of cold or warm cloths (cold or warm compresses) to the skin.  Hospital care, if the condition is severe.  The infection usually gets better within 1-2 days of treatment. Follow these instructions at home:  Take over-the-counter and prescription medicines only as told by your health care provider.  If you were prescribed an antibiotic medicine, take it as told by your health care provider. Do not stop taking the antibiotic even if you start to feel  better.  Drink enough fluid to keep your urine clear or pale yellow.  Do not touch or rub the infected area.  Raise (elevate) the infected area above the level of your heart while you are sitting or lying down.  Apply warm or cold compresses to the affected area as told by your health care provider.  Keep all follow-up visits as told by your health care provider. This is important. These visits let your health care provider make sure a more serious infection is not developing. Contact a health care provider if:  You have a fever.  Your symptoms do not improve within 1-2 days of starting treatment.  Your bone or joint underneath the infected area becomes painful after the skin has healed.  Your infection returns in the same area or another area.  You notice a swollen bump in the infected area.  You develop new symptoms.  You have a general ill feeling (malaise) with muscle aches and pains. Get help right away if:  Your symptoms get worse.  You feel very sleepy.  You develop vomiting or diarrhea that persists.  You notice red streaks coming from the infected area.  Your red area gets larger or turns dark in color. This information is not intended to replace advice given to you by your health care provider. Make sure you discuss any questions you have with your health care provider. Document Released: 03/06/2005 Document Revised: 10/05/2015 Document Reviewed: 04/05/2015 Elsevier Interactive Patient Education  2017 [place], Adult Ticks are insects that  draw blood for food. Most ticks live in shrubs and grassy areas. They climb onto people and animals that brush against the leaves and grasses that they rest on. Then they bite, attaching themselves to the skin. Most ticks are harmless, but some ticks carry germs that can spread to a person through a bite and cause a disease. To reduce your risk of getting a disease from a tick bite, it is important  to take steps to prevent tick bites. It is also important to check for ticks after being outdoors. If you find that a tick has attached to you, watch for symptoms of disease. How can I prevent tick bites? Take these steps to help prevent tick bites when you are outdoors in an area where ticks are found:  Use insect repellent that has DEET (20% or higher), picaridin, or IR3535 in it. Use it on: ? Skin that is showing. ? The top of your boots. ? Your pant legs. ? Your sleeve cuffs.  For repellent products that contain permethrin, follow product instructions. Use these products on: ? Clothing. ? Gear. ? Boots. ? Tents.  Wear protective clothing. Long sleeves and long pants offer the best protection from ticks.  Wear light-colored clothing so you can see ticks more easily.  Tuck your pant legs into your socks.  If you go walking on a trail, stay in the middle of the trail so your skin, hair, and clothing do not touch the bushes.  Avoid walking through areas with long grass.  Check for ticks on your clothing, hair, and skin often while you are outside, and check again before you go inside. Make sure to check the places that ticks attach themselves most often. These places include the scalp, neck, armpits, waist, groin, and joint areas. Ticks that carry a disease called Lyme disease have to be attached to the skin for 24-48 hours. Checking for ticks every day will lessen your risk of this and other diseases.  When you come indoors, wash your clothes and take a shower or a bath right away. Dry your clothes in a dryer on high heat for at least 60 minutes. This will kill any ticks in your clothes.  What is the proper way to remove a tick? If you find a tick on your body, remove it as soon as possible. Removing a tick sooner rather than later can prevent germs from passing from the tick to your body. To remove a tick that is crawling on your skin but has not bitten:  Go outdoors and brush the  tick off.  Remove the tick with tape or a lint roller.  To remove a tick that is attached to your skin:  Wash your hands.  If you have latex gloves, put them on.  Use tweezers, curved forceps, or a tick-removal tool to gently grasp the tick as close to your skin and the tick's head as possible.  Gently pull with steady, upward pressure until the tick lets go. When removing the tick: ? Take care to keep the tick's head attached to its body. ? Do not twist or jerk the tick. This can make the tick's head or mouth break off. ? Do not squeeze or crush the tick's body. This could force disease-carrying fluids from the tick into your body.  Do not try to remove a tick with heat, alcohol, petroleum jelly, or fingernail polish. Using these methods can cause the tick to salivate and regurgitate into your bloodstream, increasing your risk  of getting a disease. What should I do after removing a tick?  Clean the bite area with soap and water, rubbing alcohol, or an iodine scrub.  If an antiseptic cream or ointment is available, apply a small amount to the bite site.  Wash and disinfect any instruments that you used to remove the tick. How should I dispose of a tick? To dispose of a live tick, use one of these methods:  Place it in rubbing alcohol.  Place it in a sealed bag or container.  Wrap it tightly in tape.  Flush it down the toilet.  Contact a health care provider if:  You have symptoms of a disease after a tick bite. Symptoms of a tick-borne disease can occur from moments after the tick bites to up to 30 days after a tick is removed. Symptoms include: ? Muscle, joint, or bone pain. ? Difficulty walking or moving your legs. ? Numbness in the legs. ? Paralysis. ? Red rash around the tick bite area that is shaped like a target or a "bull's-eye." ? Redness and swelling in the area of the tick bite. ? Fever. ? Repeated vomiting. ? Diarrhea. ? Weight loss. ? Tender, swollen lymph  glands. ? Shortness of breath. ? Cough. ? Pain in the abdomen. ? Headache. ? Abnormal tiredness. ? A change in your level of consciousness. ? Confusion. Get help right away if:  You are not able to remove a tick.  A part of a tick breaks off and gets stuck in your skin.  Your symptoms get worse. Summary  Ticks may carry germs that can spread to a person through a bite and cause disease.  Wear protective clothing and use insect repellent to prevent tick bites. Follow product instructions.  If you find a tick on your body, remove it as soon as possible. If the tick is attached, do not try to remove with heat, alcohol, petroleum jelly, or fingernail polish.  Remove the attached tick using tweezers, curved forceps, or a tick-removal tool. Gently pull with steady, upward pressure until the tick lets go. Do not twist or jerk the tick. Do not squeeze or crush the tick's body.  If you have symptoms after being bitten by a tick, contact a health care provider. This information is not intended to replace advice given to you by your health care provider. Make sure you discuss any questions you have with your health care provider. Document Released: 05/24/2000 Document Revised: 03/08/2016 Document Reviewed: 03/08/2016 Elsevier Interactive Patient Education  [employer].

## 2016-11-25 NOTE — Progress Notes (Signed)
Subjective:    Patient ID: Edwin Ramirez, male    DOB: Oct 13, 1940, 76 y.o.   MRN: [id number]  HPI This is a 76 yo male who presents today with a tick bite. [place] 3 days ago, he pulled several ticks from his lower extremities. He notice one on him this morning and brings with him today. He successfully removed tick, it was engorged. Area on right upper thigh red and swollen. He denies fever/chills, rash, headache, muscle aches.    Past Medical History:  Diagnosis Date  . Basal cell cancer    Dr Ubaldo Glassing  . DDD (degenerative disc disease)   . Diverticulosis   . Esophageal stricture    Dr Olevia Perches  . Gastritis   . GERD (gastroesophageal reflux disease)   . Gilbert syndrome   . Glaucoma   . Hepatic cyst   . History of prostate cancer    Dr Alinda Money  . Hx of adenomatous colonic polyps   . Hyperlipidemia   . Internal hemorrhoids   . Pulmonary nodule    not seen on F/U 2014  . TMJ (dislocation of temporomandibular joint)    Past Surgical History:  Procedure Laterality Date  . APPENDECTOMY    . cataract surgery  2012   bilateral  . CHOLECYSTECTOMY  1985  . COLONOSCOPY W/ POLYPECTOMY  2004   Negative 2009 & 2013 Dr.Brodie  . ESOPHAGEAL DILATION  2004  . MOHS SURGERY     Basal Cell  . PROSTATECTOMY  07/2005   Dr.Borden  . ROTATOR CUFF REPAIR      X 2-Left; Dr Percell Miller   Family History  Problem Relation Age of Onset  . Multiple myeloma Father   . Colon cancer Mother 99  . Heart failure Mother   . Coronary artery disease Brother   . Heart attack Maternal Uncle 65  . Diabetes Neg Hx   . Stroke Neg Hx   . Esophageal cancer Neg Hx   . Rectal cancer Neg Hx   . Stomach cancer Neg Hx    Social History  Substance Use Topics  . Smoking status: Never Smoker  . Smokeless tobacco: Never Used  . Alcohol use 3.0 oz/week    5 Glasses of wine per week     Comment: Socially      Review of Systems Per HPI    Objective:   Physical Exam  Constitutional: He  is oriented to person, place, and time. He appears well-developed and well-nourished.  HENT:  Head: Normocephalic and atraumatic.  Eyes: Conjunctivae are normal.  Cardiovascular: Normal rate.   Pulmonary/Chest: Effort normal.  Musculoskeletal: He exhibits no edema.  Neurological: He is alert and oriented to person, place, and time.  Skin: Skin is warm and dry.  Right upper thigh with two areas of erythema. Lateral area with mild swelling, 0.5 cm. Medial area approximately 2 cm, raised, slightly warm to touch.   Psychiatric: He has a normal mood and affect. His behavior is normal. Judgment and thought content normal.  Vitals reviewed.     BP 130/78 (BP Location: Right Arm, Patient Position: Sitting, Cuff Size: Normal)   Pulse 60   Temp 97.7 F (36.5 C) (Oral)   Wt 162 lb (73.5 kg)   SpO2 97%   BMI 25.37 kg/m  Wt Readings from Last 3 Encounters:  11/25/16 162 lb (73.5 kg)  11/20/16 162 lb (73.5 kg)  08/29/16 169 lb 4 oz (76.8 kg)  Assessment & Plan:  1. Tick bite, initial encounter - given area of erythema, will cover for mild cellulitis and tick borne illness - doxycycline (VIBRAMYCIN) 100 MG capsule; Take 1 capsule (100 mg total) by mouth 2 (two) times daily.  Dispense: 14 capsule; Refill: 0  2. Cellulitis of right lower extremity - Provided written and verbal information regarding diagnosis and treatment. - RTC precautions reviewed - doxycycline (VIBRAMYCIN) 100 MG capsule; Take 1 capsule (100 mg total) by mouth 2 (two) times daily.  Dispense: 14 capsule; Refill: 0   Clarene Reamer, FNP-BC  [facility] [place]  11/26/2016 5:31 PM

## 2016-12-16 ENCOUNTER — Encounter: Payer: Self-pay | Admitting: Family

## 2016-12-16 ENCOUNTER — Ambulatory Visit (INDEPENDENT_AMBULATORY_CARE_PROVIDER_SITE_OTHER): Payer: PPO | Admitting: Family

## 2016-12-16 VITALS — BP 132/72 | HR 55 | Temp 97.9°F | Resp 16 | Ht 67.0 in | Wt 162.8 lb

## 2016-12-16 DIAGNOSIS — W19XXXA Unspecified fall, initial encounter: Secondary | ICD-10-CM | POA: Diagnosis not present

## 2016-12-16 DIAGNOSIS — S060X0A Concussion without loss of consciousness, initial encounter: Secondary | ICD-10-CM

## 2016-12-16 DIAGNOSIS — T07XXXA Unspecified multiple injuries, initial encounter: Secondary | ICD-10-CM

## 2016-12-16 MED ORDER — TIZANIDINE HCL 4 MG PO TABS: 4.0000 mg | ORAL_TABLET | Freq: Four times a day (QID) | ORAL | 1 refills | Status: DC | PRN

## 2016-12-16 NOTE — Assessment & Plan Note (Signed)
Multiple bruises in various states of healing most with yellowish-green discoloration with no significant tenderness and appeared to be healing well. No range of motion deficits. Continue conservative treatment with ice/moist heat and Tylenol as needed. Follow-up if symptoms worsen or do not improve.

## 2016-12-16 NOTE — Assessment & Plan Note (Signed)
Symptoms and exam are consistent with mild concussion most likely related to postconcussive syndrome following falling down 20 steps. No loss of consciousness. Neurological exam within normal limits. Does have some "swimmy headed feelings". Likely resolve with time and no evidence of intracranial hemorrhage or neurological abnormality. Continue Tylenol and Zanaflex as needed. Follow-up if symptoms worsen or do not improve.

## 2016-12-16 NOTE — Patient Instructions (Addendum)
Thank you for choosing [facility].  SUMMARY AND INSTRUCTIONS:  Please continues to take your medications as prescribed.  Ice/moist heat 20 minutes every 2 hours and as needed for discomfort.  Zanaflex as needed for muscle spasms. Stretches and exercises multiple times throughout the day.  Follow-up if symptoms worsen or do not improve.   Medication:  Your prescription(s) have been submitted to your pharmacy or been printed and provided for you. Please take as directed and contact our office if you believe you are having problem(s) with the medication(s) or have any questions.  Follow up:  If your symptoms worsen or fail to improve, please contact our office for further instruction, or in case of emergency go directly to the emergency room at the closest medical facility.     Cervical Strain and Sprain Rehab Ask your health care provider which exercises are safe for you. Do exercises exactly as told by your health care provider and adjust them as directed. It is normal to feel mild stretching, pulling, tightness, or discomfort as you do these exercises, but you should stop right away if you feel sudden pain or your pain gets worse.Do not begin these exercises until told by your health care provider. Stretching and range of motion exercises These exercises warm up your muscles and joints and improve the movement and flexibility of your neck. These exercises also help to relieve pain, numbness, and tingling. Exercise A: Cervical side bend  1. Using good posture, sit on a stable chair or stand up. 2. Without moving your shoulders, slowly tilt your left / right ear to your shoulder until you feel a stretch in your neck muscles. You should be looking straight ahead. 3. Hold for __________ seconds. 4. Repeat with the other side of your neck. Repeat __________ times. Complete this exercise __________ times a day. Exercise B: Cervical rotation  1. Using good posture, sit on a  stable chair or stand up. 2. Slowly turn your head to the side as if you are looking over your left / right shoulder. ? Keep your eyes level with the ground. ? Stop when you feel a stretch along the side and the back of your neck. 3. Hold for __________ seconds. 4. Repeat this by turning to your other side. Repeat __________ times. Complete this exercise __________ times a day. Exercise C: Thoracic extension and pectoral stretch 1. Roll a towel or a small blanket so it is about 4 inches (10 cm) in diameter. 2. Lie down on your back on a firm surface. 3. Put the towel lengthwise, under your spine in the middle of your back. It should not be not under your shoulder blades. The towel should line up with your spine from your middle back to your lower back. 4. Put your hands behind your head and let your elbows fall out to your sides. 5. Hold for __________ seconds. Repeat __________ times. Complete this exercise __________ times a day. Strengthening exercises These exercises build strength and endurance in your neck. Endurance is the ability to use your muscles for a long time, even after your muscles get tired. Exercise D: Upper cervical flexion, isometric 1. Lie on your back with a thin pillow behind your head and a small rolled-up towel under your neck. 2. Gently tuck your chin toward your chest and nod your head down to look toward your feet. Do not lift your head off the pillow. 3. Hold for __________ seconds. 4. Release the tension slowly. Relax your neck muscles  completely before you repeat this exercise. Repeat __________ times. Complete this exercise __________ times a day. Exercise E: Cervical extension, isometric  1. Stand about 6 inches (15 cm) away from a wall, with your back facing the wall. 2. Place a soft object, about 6-8 inches (15-20 cm) in diameter, between the back of your head and the wall. A soft object could be a small pillow, a ball, or a folded towel. 3. Gently tilt  your head back and press into the soft object. Keep your jaw and forehead relaxed. 4. Hold for __________ seconds. 5. Release the tension slowly. Relax your neck muscles completely before you repeat this exercise. Repeat __________ times. Complete this exercise __________ times a day. Posture and body mechanics  Body mechanics refers to the movements and positions of your body while you do your daily activities. Posture is part of body mechanics. Good posture and healthy body mechanics can help to relieve stress in your body's tissues and joints. Good posture means that your spine is in its natural S-curve position (your spine is neutral), your shoulders are pulled back slightly, and your head is not tipped forward. The following are general guidelines for applying improved posture and body mechanics to your everyday activities. Standing  When standing, keep your spine neutral and keep your feet about hip-width apart. Keep a slight bend in your knees. Your ears, shoulders, and hips should line up.  When you do a task in which you stand in one place for a long time, place one foot up on a stable object that is 2-4 inches (5-10 cm) high, such as a footstool. This helps keep your spine neutral. Sitting   When sitting, keep your spine neutral and your keep feet flat on the floor. Use a footrest, if necessary, and keep your thighs parallel to the floor. Avoid rounding your shoulders, and avoid tilting your head forward.  When working at a desk or a computer, keep your desk at a height where your hands are slightly lower than your elbows. Slide your chair under your desk so you are close enough to maintain good posture.  When working at a computer, place your monitor at a height where you are looking straight ahead and you do not have to tilt your head forward or downward to look at the screen. Resting When lying down and resting, avoid positions that are most painful for you. Try to support your neck  in a neutral position. You can use a contour pillow or a small rolled-up towel. Your pillow should support your neck but not push on it. This information is not intended to replace advice given to you by your health care provider. Make sure you discuss any questions you have with your health care provider. Document Released: 05/27/2005 Document Revised: 02/01/2016 Document Reviewed: 05/03/2015 Elsevier Interactive Patient Education  [employer].

## 2016-12-16 NOTE — Progress Notes (Signed)
Subjective:    Patient ID: Edwin Ramirez, male    DOB: 30-Aug-1940, 76 y.o.   MRN: [id number]  Chief Complaint  Patient presents with  . Fall    had a fall on 7/1 and fell down 20 stairs has had issues with headaches, ringing in ears, and stiffness in neck, states he never lost conciousness     HPI:  Edwin Ramirez is a 76 y.o. male who  has a past medical history of Basal cell cancer; DDD (degenerative disc disease); Diverticulosis; Esophageal stricture; Gastritis; GERD (gastroesophageal reflux disease); Gilbert syndrome; Glaucoma; Hepatic cyst; History of prostate cancer; adenomatous colonic polyps; Hyperlipidemia; Internal hemorrhoids; Pulmonary nodule; and TMJ (dislocation of temporomandibular joint). and presents today for an office visit.  This is a new problem. Associated symptom of neck stiffness, back pain and headaches have been going on for about 1 week since falling down approximately 20 stairs. There was no loss of consciousness. No current neurological or musculoskeletal deficits. Describes as feeling like he is swimmy headed at times. He did have burising located on around his left eye. Modifying factors include ice and heat. No confusion or disorientation. Pains are located around his neck and hip.     Allergies  Allergen Reactions  . Codeine     REACTION: agitation, mental status changes with high doses of codeine post op  . Nabumetone     Mental status changes post op with Relafen  . Pneumovax [Pneumococcal Polysaccharide Vaccine]     swelling  . Aspirin     gastritis      Outpatient Medications Prior to Visit  Medication Sig Dispense Refill  . Cholecalciferol (VITAMIN D3) 2000 UNITS capsule Take 1 capsule (2,000 Units total) by mouth daily. 100 capsule 3  . latanoprost (XALATAN) 0.005 % ophthalmic solution Place 1 drop into both eyes at bedtime.     Marland Kitchen LORazepam (ATIVAN) 1 MG tablet TAKE 1/2 TABLET BY MOUTH EVERY 8 HOURS AS NEEDED 90 tablet 2  .  Multiple Vitamin (MULTIVITAMIN) tablet Take 1 tablet by mouth daily.      . Omega-3 Fatty Acids (FISH OIL PO) Take 1 capsule by mouth daily.     Marland Kitchen omeprazole (PRILOSEC) 40 MG capsule TAKE 1 CAPSULE BY MOUTH TWICE DAILY 180 capsule 2  . polyethylene glycol powder (GLYCOLAX/MIRALAX) powder Take 1 Container by mouth once.    . rosuvastatin (CRESTOR) 10 MG tablet Take 1 tablet (10 mg total) by mouth daily. 90 tablet 3  . Simethicone 125 MG CAPS Take by mouth. Takes one capsule after meals and at bedtime    . traMADol (ULTRAM) 50 MG tablet Take 1 tablet (50 mg total) by mouth every 8 (eight) hours as needed for severe pain. 60 tablet 0  . doxycycline (VIBRAMYCIN) 100 MG capsule Take 1 capsule (100 mg total) by mouth 2 (two) times daily. 14 capsule 0  . tiZANidine (ZANAFLEX) 4 MG tablet Take 1 tablet (4 mg total) by mouth every 6 (six) hours as needed for muscle spasms. 40 tablet 1   No facility-administered medications prior to visit.       Past Surgical History:  Procedure Laterality Date  . APPENDECTOMY    . cataract surgery  2012   bilateral  . CHOLECYSTECTOMY  1985  . COLONOSCOPY W/ POLYPECTOMY  2004   Negative 2009 & 2013 Dr.Brodie  . ESOPHAGEAL DILATION  2004  . MOHS SURGERY     Basal Cell  . PROSTATECTOMY  07/2005   Dr.Borden  .  ROTATOR CUFF REPAIR      X 2-Left; Dr Percell Miller      Past Medical History:  Diagnosis Date  . Basal cell cancer    Dr Ubaldo Glassing  . DDD (degenerative disc disease)   . Diverticulosis   . Esophageal stricture    Dr Olevia Perches  . Gastritis   . GERD (gastroesophageal reflux disease)   . Gilbert syndrome   . Glaucoma   . Hepatic cyst   . History of prostate cancer    Dr Alinda Money  . Hx of adenomatous colonic polyps   . Hyperlipidemia   . Internal hemorrhoids   . Pulmonary nodule    not seen on F/U 2014  . TMJ (dislocation of temporomandibular joint)       Review of Systems  Constitutional: Negative for activity change, appetite change, chills,  fatigue and fever.  Cardiovascular: Negative for chest pain, palpitations and leg swelling.  Musculoskeletal: Positive for neck stiffness. Negative for neck pain.  Neurological: Positive for headaches. Negative for dizziness, seizures, syncope and weakness.      Objective:    BP 132/72 (BP Location: Left Arm, Patient Position: Sitting, Cuff Size: Normal)   Pulse (!) 55   Temp 97.9 F (36.6 C) (Oral)   Resp 16   Ht 5\' 7"  (1.702 m)   Wt 162 lb 12.8 oz (73.8 kg)   SpO2 97%   BMI 25.50 kg/m  Nursing note and vital signs reviewed.  Physical Exam  Constitutional: He is oriented to person, place, and time. He appears well-developed and well-nourished. No distress.  Eyes: Conjunctivae and EOM are normal. Pupils are equal, round, and reactive to light.  Neck: Normal range of motion. Neck supple.  Cardiovascular: Normal rate, regular rhythm, normal heart sounds and intact distal pulses.   Pulmonary/Chest: Effort normal and breath sounds normal.  Musculoskeletal:  No obvious deformity or edema with bruises located in bilateral upper trapezius, spinous process of lumbar spine, and left hip. Range of motion is within normal limits for hip and neck. Pulses and sensation are intact and appropriate.   Lymphadenopathy:    He has no cervical adenopathy.  Neurological: He is alert and oriented to person, place, and time. He has normal reflexes. No cranial nerve deficit.  Skin: Skin is warm and dry.  Psychiatric: He has a normal mood and affect. His behavior is normal. Judgment and thought content normal.       Assessment & Plan:   Problem List Items Addressed This Visit      Nervous and Auditory   Concussion with no loss of consciousness    Symptoms and exam are consistent with mild concussion most likely related to postconcussive syndrome following falling down 20 steps. No loss of consciousness. Neurological exam within normal limits. Does have some "swimmy headed feelings". Likely resolve  with time and no evidence of intracranial hemorrhage or neurological abnormality. Continue Tylenol and Zanaflex as needed. Follow-up if symptoms worsen or do not improve.        Other   Multiple bruises - Primary    Multiple bruises in various states of healing most with yellowish-green discoloration with no significant tenderness and appeared to be healing well. No range of motion deficits. Continue conservative treatment with ice/moist heat and Tylenol as needed. Follow-up if symptoms worsen or do not improve.       Other Visit Diagnoses    Fall, initial encounter           I have discontinued Mr. Viviano  doxycycline. I am also having him maintain his multivitamin, latanoprost, Omega-3 Fatty Acids (FISH OIL PO), Vitamin D3, traMADol, LORazepam, Simethicone, omeprazole, polyethylene glycol powder, rosuvastatin, and tiZANidine.   Meds ordered this encounter  Medications  . tiZANidine (ZANAFLEX) 4 MG tablet    Sig: Take 1 tablet (4 mg total) by mouth every 6 (six) hours as needed for muscle spasms.    Dispense:  40 tablet    Refill:  1     Follow-up: Return if symptoms worsen or fail to improve.  Mauricio Po, FNP

## 2016-12-31 DIAGNOSIS — H5007 Alternating esotropia with V pattern: Secondary | ICD-10-CM | POA: Diagnosis not present

## 2016-12-31 DIAGNOSIS — H401131 Primary open-angle glaucoma, bilateral, mild stage: Secondary | ICD-10-CM | POA: Diagnosis not present

## 2016-12-31 DIAGNOSIS — Z961 Presence of intraocular lens: Secondary | ICD-10-CM | POA: Diagnosis not present

## 2016-12-31 DIAGNOSIS — H532 Diplopia: Secondary | ICD-10-CM | POA: Diagnosis not present

## 2016-12-31 DIAGNOSIS — H43813 Vitreous degeneration, bilateral: Secondary | ICD-10-CM | POA: Diagnosis not present

## 2017-01-03 ENCOUNTER — Other Ambulatory Visit: Payer: Self-pay | Admitting: Optometry

## 2017-01-03 DIAGNOSIS — H532 Diplopia: Secondary | ICD-10-CM

## 2017-01-08 ENCOUNTER — Ambulatory Visit
Admission: RE | Admit: 2017-01-08 | Discharge: 2017-01-08 | Disposition: A | Discharge status: Home / Self Care | Payer: PPO | Source: Ambulatory Visit | Attending: Optometry | Admitting: Optometry

## 2017-01-08 DIAGNOSIS — H532 Diplopia: Secondary | ICD-10-CM

## 2017-01-08 DIAGNOSIS — S0990XA Unspecified injury of head, initial encounter: Secondary | ICD-10-CM | POA: Diagnosis not present

## 2017-01-08 MED ORDER — GADOBENATE DIMEGLUMINE 529 MG/ML IV SOLN
15.0000 mL | Freq: Once | INTRAVENOUS | Status: AC | PRN
  Administered 2017-01-08: 15 mL via INTRAVENOUS

## 2017-01-17 ENCOUNTER — Other Ambulatory Visit: Payer: PPO

## 2017-03-04 ENCOUNTER — Encounter: Payer: Self-pay | Admitting: Internal Medicine

## 2017-03-04 ENCOUNTER — Ambulatory Visit (INDEPENDENT_AMBULATORY_CARE_PROVIDER_SITE_OTHER): Payer: PPO | Admitting: Internal Medicine

## 2017-03-04 ENCOUNTER — Other Ambulatory Visit (INDEPENDENT_AMBULATORY_CARE_PROVIDER_SITE_OTHER): Payer: PPO

## 2017-03-04 VITALS — BP 126/72 | HR 50 | Temp 97.6°F | Ht 67.0 in | Wt 162.0 lb

## 2017-03-04 DIAGNOSIS — Z8546 Personal history of malignant neoplasm of prostate: Secondary | ICD-10-CM | POA: Diagnosis not present

## 2017-03-04 DIAGNOSIS — Z0000 Encounter for general adult medical examination without abnormal findings: Secondary | ICD-10-CM

## 2017-03-04 DIAGNOSIS — S060X0A Concussion without loss of consciousness, initial encounter: Secondary | ICD-10-CM

## 2017-03-04 DIAGNOSIS — E785 Hyperlipidemia, unspecified: Secondary | ICD-10-CM

## 2017-03-04 DIAGNOSIS — T07XXXA Unspecified multiple injuries, initial encounter: Secondary | ICD-10-CM

## 2017-03-04 LAB — PSA: PSA: 0 ng/mL — ABNORMAL LOW (ref 0.10–4.00)

## 2017-03-04 MED ORDER — ZOSTER VAC RECOMB ADJUVANTED 50 MCG/0.5ML IM SUSR: 0.5000 mL | Freq: Once | INTRAMUSCULAR | 1 refills | Status: AC

## 2017-03-04 NOTE — Assessment & Plan Note (Addendum)
Here for medicare wellness/physical  Diet: heart healthy  Physical activity: not sedentary  Depression/mood screen: negative  Hearing: intact to whispered voice  Visual acuity: grossly normal, performs annual eye exam  ADLs: capable  Fall risk: none  Home safety: good  Cognitive evaluation: intact to orientation, naming, recall and repetition  EOL planning: adv directives, full code/ I agree  I have personally reviewed and have noted  1. The patient's medical, surgical and social history  2. Their use of alcohol, tobacco or illicit drugs  3. Their current medications and supplements  4. The patient's functional ability including ADL's, fall risks, home safety risks and hearing or visual impairment.  5. Diet and physical activities  6. Evidence for depression or mood disorders 7. The roster of all physicians providing medical care to patient - is listed in the Snapshot section of the chart and reviewed today.    Today patient counseled on age appropriate routine health concerns for screening and prevention, each reviewed and up to date or declined. Immunizations reviewed and up to date or declined. Labs ordered and reviewed. Risk factors for depression reviewed and negative. Hearing function and visual acuity are intact. ADLs screened and addressed as needed. Functional ability and level of safety reviewed and appropriate. Education, counseling and referrals performed based on assessed risks today. Patient provided with a copy of personalized plan for preventive services.   Colon due 2024 Flu shot later Shingrix Rx

## 2017-03-04 NOTE — Assessment & Plan Note (Signed)
12/09/16 fell down the 22 steps

## 2017-03-04 NOTE — Progress Notes (Signed)
Subjective:  Patient ID: Edwin Ramirez, male    DOB: August 12, 1940  Age: 76 y.o. MRN: [id number]  CC: No chief complaint on file.   HPI Edwin Ramirez presents for a well exam C/o burning pain in the L lat ankle at times x 6 weeks  Outpatient Medications Prior to Visit  Medication Sig Dispense Refill  . Cholecalciferol (VITAMIN D3) 2000 UNITS capsule Take 1 capsule (2,000 Units total) by mouth daily. 100 capsule 3  . latanoprost (XALATAN) 0.005 % ophthalmic solution Place 1 drop into both eyes at bedtime.     Marland Kitchen LORazepam (ATIVAN) 1 MG tablet TAKE 1/2 TABLET BY MOUTH EVERY 8 HOURS AS NEEDED 90 tablet 2  . Multiple Vitamin (MULTIVITAMIN) tablet Take 1 tablet by mouth daily.      . Omega-3 Fatty Acids (FISH OIL PO) Take 1 capsule by mouth daily.     Marland Kitchen omeprazole (PRILOSEC) 40 MG capsule TAKE 1 CAPSULE BY MOUTH TWICE DAILY 180 capsule 2  . polyethylene glycol powder (GLYCOLAX/MIRALAX) powder Take 1 Container by mouth once.    . rosuvastatin (CRESTOR) 10 MG tablet Take 1 tablet (10 mg total) by mouth daily. 90 tablet 3  . Simethicone 125 MG CAPS Take by mouth. Takes one capsule after meals and at bedtime    . tiZANidine (ZANAFLEX) 4 MG tablet Take 1 tablet (4 mg total) by mouth every 6 (six) hours as needed for muscle spasms. 40 tablet 1  . traMADol (ULTRAM) 50 MG tablet Take 1 tablet (50 mg total) by mouth every 8 (eight) hours as needed for severe pain. 60 tablet 0   No facility-administered medications prior to visit.     ROS Review of Systems  Constitutional: Negative for appetite change, fatigue and unexpected weight change.  HENT: Negative for congestion, nosebleeds, sneezing, sore throat and trouble swallowing.   Eyes: Negative for itching and visual disturbance.  Respiratory: Negative for cough.   Cardiovascular: Negative for chest pain, palpitations and leg swelling.  Gastrointestinal: Negative for abdominal distention, blood in stool, diarrhea and nausea.    Genitourinary: Negative for frequency and hematuria.  Musculoskeletal: Negative for back pain, gait problem, joint swelling and neck pain.  Skin: Negative for rash.  Neurological: Negative for dizziness, tremors, speech difficulty and weakness.  Psychiatric/Behavioral: Negative for agitation, dysphoric mood and sleep disturbance. The patient is not nervous/anxious.     Objective:  BP 126/72 (BP Location: Left Arm, Patient Position: Sitting, Cuff Size: Normal)   Pulse (!) 50   Temp 97.6 F (36.4 C) (Oral)   Ht 5\' 7"  (1.702 m)   Wt 162 lb (73.5 kg)   SpO2 98%   BMI 25.37 kg/m   BP Readings from Last 3 Encounters:  03/04/17 126/72  12/16/16 132/72  11/25/16 128/78    Wt Readings from Last 3 Encounters:  03/04/17 162 lb (73.5 kg)  12/16/16 162 lb 12.8 oz (73.8 kg)  11/25/16 162 lb (73.5 kg)    Physical Exam  Constitutional: He is oriented to person, place, and time. He appears well-developed. No distress.  NAD  HENT:  Mouth/Throat: Oropharynx is clear and moist.  Eyes: Pupils are equal, round, and reactive to light. Conjunctivae are normal.  Neck: Normal range of motion. No JVD present. No thyromegaly present.  Cardiovascular: Normal rate, regular rhythm, normal heart sounds and intact distal pulses.  Exam reveals no gallop and no friction rub.   No murmur heard. Pulmonary/Chest: Effort normal and breath sounds normal. No respiratory distress. He  has no wheezes. He has no rales. He exhibits no tenderness.  Abdominal: Soft. Bowel sounds are normal. He exhibits no distension and no mass. There is no tenderness. There is no rebound and no guarding.  Genitourinary: Rectum normal. Rectal exam shows guaiac negative stool.  Musculoskeletal: Normal range of motion. He exhibits no edema or tenderness.  Lymphadenopathy:    He has no cervical adenopathy.  Neurological: He is alert and oriented to person, place, and time. He has normal reflexes. No cranial nerve deficit. He exhibits  normal muscle tone. He displays a negative Romberg sign. Coordination and gait normal.  Skin: Skin is warm and dry. No rash noted.  Psychiatric: He has a normal mood and affect. His behavior is normal. Judgment and thought content normal.  no prostate  Lab Results  Component Value Date   WBC 5.5 07/08/2016   HGB 14.9 07/08/2016   HCT 44.3 07/08/2016   PLT 162.0 07/08/2016   GLUCOSE 100 (H) 11/20/2016   CHOL 134 11/20/2016   TRIG 137.0 11/20/2016   HDL 37.90 (L) 11/20/2016   LDLDIRECT 78.8 02/05/2011   LDLCALC 69 11/20/2016   ALT 19 11/20/2016   AST 22 11/20/2016   NA 140 11/20/2016   K 4.7 11/20/2016   CL 106 11/20/2016   CREATININE 1.15 11/20/2016   BUN 17 11/20/2016   CO2 29 11/20/2016   TSH 0.68 03/01/2016   PSA 0.00 (L) 03/01/2016   HGBA1C 5.7 03/07/2014    Mr Brain W Wo Contrast  Result Date: 01/08/2017 CLINICAL DATA:  76 year old male with chronic diplopia when going down stairs. Fall with head injury on 12/08/2016. Creatinine was obtained on site at [facility] at [address] Results: Creatinine 1.0 mg/dL. EXAM: MRI HEAD WITHOUT AND WITH CONTRAST TECHNIQUE: Multiplanar, multiecho pulse sequences of the brain and surrounding structures were obtained without and with intravenous contrast. CONTRAST:  41mL MULTIHANCE GADOBENATE DIMEGLUMINE 529 MG/ML IV SOLN COMPARISON:  Cervical spine MRI 08/25/2006. FINDINGS: Brain: Cerebral volume is within normal limits for age. No restricted diffusion to suggest acute infarction. No midline shift, mass effect, evidence of mass lesion, ventriculomegaly, extra-axial collection or acute intracranial hemorrhage. Cervicomedullary junction and pituitary are within normal limits. Pearline Cables and white matter signal is within normal limits for age throughout the brain. No chronic cerebral blood products identified. No focal encephalomalacia. No abnormal enhancement identified. No dural thickening. Vascular: Major intracranial vascular flow  voids appear normal. Skull and upper cervical spine: Negative. Normal bone marrow signal. Sinuses/Orbits: Postoperative changes to both globes. The orbits soft tissues otherwise appear normal. Normal cavernous sinus. Trace paranasal sinus mucosal thickening. Other: Visible internal auditory structures appear normal. Mastoid air cells are clear. Negative scalp and visible face soft tissues. IMPRESSION: Normal for age MRI appearance of the brain. No explanation for diplopia. If there is a discrete visual system cranial neuropathy then a dedicated Orbit MRI without and with contrast may be valuable. Electronically Signed   By: Genevie Ann M.D.   On: 01/08/2017 12:22    Assessment & Plan:   There are no diagnoses linked to this encounter. I am having Mr. Pasternak maintain his multivitamin, latanoprost, Omega-3 Fatty Acids (FISH OIL PO), Vitamin D3, traMADol, LORazepam, Simethicone, omeprazole, polyethylene glycol powder, rosuvastatin, and tiZANidine.  No orders of the defined types were placed in this encounter.    Follow-up: No Follow-up on file.  Walker Kehr, MD

## 2017-03-04 NOTE — Assessment & Plan Note (Signed)
Recent labs reviewed.

## 2017-03-04 NOTE — Assessment & Plan Note (Signed)
PSA

## 2017-03-04 NOTE — Assessment & Plan Note (Signed)
Resolved

## 2017-03-18 ENCOUNTER — Ambulatory Visit (INDEPENDENT_AMBULATORY_CARE_PROVIDER_SITE_OTHER): Payer: PPO

## 2017-03-18 DIAGNOSIS — Z23 Encounter for immunization: Secondary | ICD-10-CM | POA: Diagnosis not present

## 2017-05-05 DIAGNOSIS — Z961 Presence of intraocular lens: Secondary | ICD-10-CM | POA: Diagnosis not present

## 2017-05-05 DIAGNOSIS — H43813 Vitreous degeneration, bilateral: Secondary | ICD-10-CM | POA: Diagnosis not present

## 2017-05-05 DIAGNOSIS — H532 Diplopia: Secondary | ICD-10-CM | POA: Diagnosis not present

## 2017-05-05 DIAGNOSIS — H401131 Primary open-angle glaucoma, bilateral, mild stage: Secondary | ICD-10-CM | POA: Diagnosis not present

## 2017-05-05 DIAGNOSIS — H5007 Alternating esotropia with V pattern: Secondary | ICD-10-CM | POA: Diagnosis not present

## 2017-05-20 ENCOUNTER — Other Ambulatory Visit: Payer: Self-pay | Admitting: Internal Medicine

## 2017-07-10 DIAGNOSIS — Z85828 Personal history of other malignant neoplasm of skin: Secondary | ICD-10-CM | POA: Diagnosis not present

## 2017-07-10 DIAGNOSIS — L820 Inflamed seborrheic keratosis: Secondary | ICD-10-CM | POA: Diagnosis not present

## 2017-07-10 DIAGNOSIS — L814 Other melanin hyperpigmentation: Secondary | ICD-10-CM | POA: Diagnosis not present

## 2017-07-10 DIAGNOSIS — L570 Actinic keratosis: Secondary | ICD-10-CM | POA: Diagnosis not present

## 2017-07-10 DIAGNOSIS — L821 Other seborrheic keratosis: Secondary | ICD-10-CM | POA: Diagnosis not present

## 2017-07-10 DIAGNOSIS — L853 Xerosis cutis: Secondary | ICD-10-CM | POA: Diagnosis not present

## 2017-08-11 ENCOUNTER — Ambulatory Visit (INDEPENDENT_AMBULATORY_CARE_PROVIDER_SITE_OTHER): Payer: Medicare HMO | Admitting: Family

## 2017-08-11 ENCOUNTER — Encounter: Payer: Self-pay | Admitting: Family

## 2017-08-11 VITALS — BP 116/70 | HR 66 | Temp 97.9°F | Ht 67.0 in | Wt 166.1 lb

## 2017-08-11 DIAGNOSIS — J019 Acute sinusitis, unspecified: Secondary | ICD-10-CM | POA: Diagnosis not present

## 2017-08-11 MED ORDER — BENZONATATE 100 MG PO CAPS: 100.0000 mg | ORAL_CAPSULE | Freq: Three times a day (TID) | ORAL | 0 refills | Status: DC | PRN

## 2017-08-11 MED ORDER — AMOXICILLIN-POT CLAVULANATE 875-125 MG PO TABS: 1.0000 | ORAL_TABLET | Freq: Two times a day (BID) | ORAL | 0 refills | Status: DC

## 2017-08-11 NOTE — Progress Notes (Signed)
Edwin Ramirez is a 77 y.o. male with the following history as recorded in [employer]:  Patient Active Problem List   Diagnosis Date Noted  . Multiple bruises 12/16/2016  . Concussion with no loss of consciousness 12/16/2016  . Abdominal pain 07/08/2016  . Nausea 07/08/2016  . RUQ pain 04/26/2016  . Back pain 04/26/2016  . LBP radiating to right leg 03/01/2016  . Sciatic radiculitis 02/13/2016  . Well adult exam 03/01/2015  . DECREASED HEARING 01/17/2009  . [place] SYNDROME 05/19/2008  . PULMONARY NODULE 05/19/2008  . HEMORRHOIDS, INTERNAL 05/18/2008  . ESOPHAGEAL STRICTURE 05/18/2008  . DIVERTICULOSIS OF COLON 05/18/2008  . COLONIC POLYPS, ADENOMATOUS, HX OF 05/18/2008  . 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 PROSTATE CANCER, HX OF 01/12/2008  . HEPATIC CYST 12/01/2007  . GERD 09/03/2007  . Dyslipidemia 03/03/2007  . Skin cancer 10/29/2006    Current Outpatient Medications  Medication Sig Dispense Refill  . amoxicillin-clavulanate (AUGMENTIN) 875-125 MG tablet Take 1 tablet by mouth 2 (two) times daily. 20 tablet 0  . benzonatate (TESSALON) 100 MG capsule Take 1 capsule (100 mg total) by mouth 3 (three) times daily as needed. 20 capsule 0  . latanoprost (XALATAN) 0.005 % ophthalmic solution Place 1 drop into both eyes at bedtime.     Marland Kitchen LORazepam (ATIVAN) 1 MG tablet TAKE 1/2 TABLET BY MOUTH EVERY 8 HOURS AS NEEDED 90 tablet 3  . omeprazole (PRILOSEC) 40 MG capsule TAKE 1 CAPSULE BY MOUTH TWICE DAILY 180 capsule 2  . polyethylene glycol powder (GLYCOLAX/MIRALAX) powder Take 1 Container by mouth once.    . rosuvastatin (CRESTOR) 10 MG tablet Take 1 tablet (10 mg total) by mouth daily. 90 tablet 3  . Simethicone 125 MG CAPS Take by mouth. Takes one capsule after meals and at bedtime    . traMADol (ULTRAM) 50 MG tablet Take 1 tablet (50 mg total) by mouth every 8 (eight) hours as needed for severe pain. 60 tablet 0   No current  facility-administered medications for this visit.     Allergies: Codeine; Nabumetone; Pneumovax [pneumococcal polysaccharide vaccine]; and Aspirin  Past Medical History:  Diagnosis Date  . Basal cell cancer    Dr Ubaldo Glassing  . DDD (degenerative disc disease)   . Diverticulosis   . Esophageal stricture    Dr Olevia Perches  . Gastritis   . GERD (gastroesophageal reflux disease)   . Gilbert syndrome   . Glaucoma   . Hepatic cyst   . History of prostate cancer    Dr Alinda Money  . Hx of adenomatous colonic polyps   . Hyperlipidemia   . Internal hemorrhoids   . Pulmonary nodule    not seen on F/U 2014  . TMJ (dislocation of temporomandibular joint)     Past Surgical History:  Procedure Laterality Date  . APPENDECTOMY    . cataract surgery  2012   bilateral  . CHOLECYSTECTOMY  1985  . COLONOSCOPY W/ POLYPECTOMY  2004   Negative 2009 & 2013 Dr.Brodie  . ESOPHAGEAL DILATION  2004  . MOHS SURGERY     Basal Cell  . PROSTATECTOMY  07/2005   Dr.Borden  . ROTATOR CUFF REPAIR      X 2-Left; Dr Percell Miller    Family History  Problem Relation Age of Onset  . Multiple myeloma Father   . Colon cancer Mother 46  . Heart failure Mother   . Coronary artery disease Brother   . Heart attack Maternal Uncle 65  . Diabetes  Neg Hx   . Stroke Neg Hx   . Esophageal cancer Neg Hx   . Rectal cancer Neg Hx   . Stomach cancer Neg Hx     Social History   Tobacco Use  . Smoking status: Never Smoker  . Smokeless tobacco: Never Used  Substance Use Topics  . Alcohol use: Yes    Alcohol/week: 3.0 oz    Types: 5 Glasses of wine per week    Comment: Socially    Subjective:  Patient presents with 2 week history of cough /congestion; "just cant get it to break." Started with sinus pain/ pressure and feels like moving into chest; using OTC Nyquil with limited benefit;   Objective:  Vitals:   08/11/17 1507  BP: 116/70  Pulse: 66  Temp: 97.9 F (36.6 C)  TempSrc: Oral  SpO2: 98%  Weight: 166 lb 1.9 oz (75.4  kg)  Height: '5\' 7"'  (1.702 m)    General: Well developed, well nourished, in no acute distress  Skin : Warm and dry.  Head: Normocephalic and atraumatic  Eyes: Sclera and conjunctiva clear; pupils round and reactive to light; extraocular movements intact  Ears: External normal; canals clear; tympanic membranes congested bilaterally Oropharynx: Pink, supple. No suspicious lesions  Neck: Supple without thyromegaly, adenopathy  Lungs: Respirations unlabored; clear to auscultation bilaterally without wheeze, rales, rhonchi  CVS exam: normal rate and regular rhythm.  Neurologic: Alert and oriented; speech intact; face symmetrical; moves all extremities well; CNII-XII intact without focal deficit  Assessment:  1. Acute sinusitis, recurrence not specified, unspecified location     Plan:  Rx for Augmentin 875 mg bid x 10 days, Tessalon Perles 100 mg tid and continue saline nasal spray; increase fluids, rest and follow-up worse, no better.   No Follow-up on file.  No orders of the defined types were placed in this encounter.   Requested Prescriptions   Signed Prescriptions Disp Refills  . amoxicillin-clavulanate (AUGMENTIN) 875-125 MG tablet 20 tablet 0    Sig: Take 1 tablet by mouth 2 (two) times daily.  . benzonatate (TESSALON) 100 MG capsule 20 capsule 0    Sig: Take 1 capsule (100 mg total) by mouth 3 (three) times daily as needed.

## 2017-09-03 ENCOUNTER — Encounter: Payer: Self-pay | Admitting: Internal Medicine

## 2017-09-03 ENCOUNTER — Ambulatory Visit (INDEPENDENT_AMBULATORY_CARE_PROVIDER_SITE_OTHER): Payer: Medicare HMO | Admitting: Internal Medicine

## 2017-09-03 DIAGNOSIS — K219 Gastro-esophageal reflux disease without esophagitis: Secondary | ICD-10-CM

## 2017-09-03 DIAGNOSIS — J0100 Acute maxillary sinusitis, unspecified: Secondary | ICD-10-CM | POA: Diagnosis not present

## 2017-09-03 DIAGNOSIS — J329 Chronic sinusitis, unspecified: Secondary | ICD-10-CM | POA: Insufficient documentation

## 2017-09-03 DIAGNOSIS — M545 Low back pain, unspecified: Secondary | ICD-10-CM

## 2017-09-03 DIAGNOSIS — J019 Acute sinusitis, unspecified: Secondary | ICD-10-CM | POA: Insufficient documentation

## 2017-09-03 MED ORDER — OMEPRAZOLE 40 MG PO CPDR: 40.0000 mg | DELAYED_RELEASE_CAPSULE | Freq: Two times a day (BID) | ORAL | 3 refills | Status: DC

## 2017-09-03 MED ORDER — AMOXICILLIN-POT CLAVULANATE 875-125 MG PO TABS: 1.0000 | ORAL_TABLET | Freq: Two times a day (BID) | ORAL | 0 refills | Status: DC

## 2017-09-03 NOTE — Progress Notes (Signed)
Subjective:  Patient ID: Edwin Ramirez, male    DOB: 10/01/1940  Age: 77 y.o. MRN: [id number]  CC: No chief complaint on file.   HPI Edwin Ramirez presents for GERD C/o URI - 70% better - not going away; yellow phlegm F/u OA  Outpatient Medications Prior to Visit  Medication Sig Dispense Refill  . amoxicillin-clavulanate (AUGMENTIN) 875-125 MG tablet Take 1 tablet by mouth 2 (two) times daily. 20 tablet 0  . benzonatate (TESSALON) 100 MG capsule Take 1 capsule (100 mg total) by mouth 3 (three) times daily as needed. 20 capsule 0  . latanoprost (XALATAN) 0.005 % ophthalmic solution Place 1 drop into both eyes at bedtime.     Marland Kitchen LORazepam (ATIVAN) 1 MG tablet TAKE 1/2 TABLET BY MOUTH EVERY 8 HOURS AS NEEDED 90 tablet 3  . polyethylene glycol powder (GLYCOLAX/MIRALAX) powder Take 1 Container by mouth once.    . rosuvastatin (CRESTOR) 10 MG tablet Take 1 tablet (10 mg total) by mouth daily. 90 tablet 3  . Simethicone 125 MG CAPS Take by mouth. Takes one capsule after meals and at bedtime    . traMADol (ULTRAM) 50 MG tablet Take 1 tablet (50 mg total) by mouth every 8 (eight) hours as needed for severe pain. 60 tablet 0  . omeprazole (PRILOSEC) 40 MG capsule TAKE 1 CAPSULE BY MOUTH TWICE DAILY 180 capsule 2   No facility-administered medications prior to visit.     ROS Review of Systems  Constitutional: Negative for appetite change, fatigue and unexpected weight change.  HENT: Positive for congestion, rhinorrhea and sinus pressure. Negative for nosebleeds, sneezing, sore throat and trouble swallowing.   Eyes: Negative for itching and visual disturbance.  Respiratory: Positive for cough.   Cardiovascular: Negative for chest pain, palpitations and leg swelling.  Gastrointestinal: Negative for abdominal distention, blood in stool, diarrhea and nausea.  Genitourinary: Negative for frequency and hematuria.  Musculoskeletal: Negative for back pain, gait problem, joint swelling  and neck pain.  Skin: Negative for rash.  Neurological: Negative for dizziness, tremors, speech difficulty and weakness.  Psychiatric/Behavioral: Negative for agitation, dysphoric mood and sleep disturbance. The patient is not nervous/anxious.     Objective:  BP 116/72 (BP Location: Left Arm, Patient Position: Sitting, Cuff Size: Normal)   Pulse (!) 59   Temp 98.2 F (36.8 C) (Oral)   Ht 5\' 7"  (1.702 m)   Wt 165 lb (74.8 kg)   SpO2 97%   BMI 25.84 kg/m   BP Readings from Last 3 Encounters:  09/03/17 116/72  08/11/17 116/70  03/04/17 126/72    Wt Readings from Last 3 Encounters:  09/03/17 165 lb (74.8 kg)  08/11/17 166 lb 1.9 oz (75.4 kg)  03/04/17 162 lb (73.5 kg)    Physical Exam  Constitutional: He is oriented to person, place, and time. He appears well-developed. No distress.  NAD  HENT:  Mouth/Throat: Oropharynx is clear and moist.  Eyes: Pupils are equal, round, and reactive to light. Conjunctivae are normal.  Neck: Normal range of motion. No JVD present. No thyromegaly present.  Cardiovascular: Normal rate, regular rhythm, normal heart sounds and intact distal pulses. Exam reveals no gallop and no friction rub.  No murmur heard. Pulmonary/Chest: Effort normal and breath sounds normal. No respiratory distress. He has no wheezes. He has no rales. He exhibits no tenderness.  Abdominal: Soft. Bowel sounds are normal. He exhibits no distension and no mass. There is no tenderness. There is no rebound and no guarding.  Musculoskeletal: Normal range of motion. He exhibits no edema or tenderness.  Lymphadenopathy:    He has no cervical adenopathy.  Neurological: He is alert and oriented to person, place, and time. He has normal reflexes. No cranial nerve deficit. He exhibits normal muscle tone. He displays a negative Romberg sign. Coordination and gait normal.  Skin: Skin is warm and dry. No rash noted.  Psychiatric: He has a normal mood and affect. His behavior is normal.  Judgment and thought content normal.  nares w/green d/c  Lab Results  Component Value Date   WBC 5.5 07/08/2016   HGB 14.9 07/08/2016   HCT 44.3 07/08/2016   PLT 162.0 07/08/2016   GLUCOSE 100 (H) 11/20/2016   CHOL 134 11/20/2016   TRIG 137.0 11/20/2016   HDL 37.90 (L) 11/20/2016   LDLDIRECT 78.8 02/05/2011   LDLCALC 69 11/20/2016   ALT 19 11/20/2016   AST 22 11/20/2016   NA 140 11/20/2016   K 4.7 11/20/2016   CL 106 11/20/2016   CREATININE 1.15 11/20/2016   BUN 17 11/20/2016   CO2 29 11/20/2016   TSH 0.68 03/01/2016   PSA 0.00 (L) 03/04/2017   HGBA1C 5.7 03/07/2014    Mr Brain W Wo Contrast  Result Date: 01/08/2017 CLINICAL DATA:  77 year old male with chronic diplopia when going down stairs. Fall with head injury on 12/08/2016. Creatinine was obtained on site at [facility] at [address] Results: Creatinine 1.0 mg/dL. EXAM: MRI HEAD WITHOUT AND WITH CONTRAST TECHNIQUE: Multiplanar, multiecho pulse sequences of the brain and surrounding structures were obtained without and with intravenous contrast. CONTRAST:  41mL MULTIHANCE GADOBENATE DIMEGLUMINE 529 MG/ML IV SOLN COMPARISON:  Cervical spine MRI 08/25/2006. FINDINGS: Brain: Cerebral volume is within normal limits for age. No restricted diffusion to suggest acute infarction. No midline shift, mass effect, evidence of mass lesion, ventriculomegaly, extra-axial collection or acute intracranial hemorrhage. Cervicomedullary junction and pituitary are within normal limits. Pearline Cables and white matter signal is within normal limits for age throughout the brain. No chronic cerebral blood products identified. No focal encephalomalacia. No abnormal enhancement identified. No dural thickening. Vascular: Major intracranial vascular flow voids appear normal. Skull and upper cervical spine: Negative. Normal bone marrow signal. Sinuses/Orbits: Postoperative changes to both globes. The orbits soft tissues otherwise appear normal. Normal  cavernous sinus. Trace paranasal sinus mucosal thickening. Other: Visible internal auditory structures appear normal. Mastoid air cells are clear. Negative scalp and visible face soft tissues. IMPRESSION: Normal for age MRI appearance of the brain. No explanation for diplopia. If there is a discrete visual system cranial neuropathy then a dedicated Orbit MRI without and with contrast may be valuable. Electronically Signed   By: Genevie Ann M.D.   On: 01/08/2017 12:22    Assessment & Plan:   There are no diagnoses linked to this encounter. I have changed Marily Lente. P[place]ovost "Bob"'s omeprazole. I am also having him maintain his latanoprost, traMADol, Simethicone, polyethylene glycol powder, rosuvastatin, LORazepam, amoxicillin-clavulanate, and benzonatate.  Meds ordered this encounter  Medications  . omeprazole (PRILOSEC) 40 MG capsule    Sig: Take 1 capsule (40 mg total) by mouth 2 (two) times daily.    Dispense:  180 capsule    Refill:  3     Follow-up: No follow-ups on file.  Walker Kehr, MD

## 2017-09-03 NOTE — Patient Instructions (Signed)
Take a probiotic 

## 2017-09-03 NOTE — Assessment & Plan Note (Signed)
Augmentin x 2 weeks Probiotic

## 2017-09-03 NOTE — Assessment & Plan Note (Signed)
Tramadol - rare Vit D resart

## 2017-09-03 NOTE — Assessment & Plan Note (Signed)
On Prilosec

## 2017-09-17 DIAGNOSIS — H401131 Primary open-angle glaucoma, bilateral, mild stage: Secondary | ICD-10-CM | POA: Diagnosis not present

## 2017-09-17 DIAGNOSIS — Z961 Presence of intraocular lens: Secondary | ICD-10-CM | POA: Diagnosis not present

## 2017-09-17 DIAGNOSIS — H5007 Alternating esotropia with V pattern: Secondary | ICD-10-CM | POA: Diagnosis not present

## 2017-09-17 DIAGNOSIS — H532 Diplopia: Secondary | ICD-10-CM | POA: Diagnosis not present

## 2017-09-17 DIAGNOSIS — H43813 Vitreous degeneration, bilateral: Secondary | ICD-10-CM | POA: Diagnosis not present

## 2017-09-25 ENCOUNTER — Encounter: Payer: Self-pay | Admitting: Internal Medicine

## 2017-09-25 ENCOUNTER — Ambulatory Visit (INDEPENDENT_AMBULATORY_CARE_PROVIDER_SITE_OTHER)
Admission: RE | Admit: 2017-09-25 | Discharge: 2017-09-25 | Disposition: A | Discharge status: Home / Self Care | Payer: Medicare HMO | Source: Ambulatory Visit | Attending: Internal Medicine | Admitting: Internal Medicine

## 2017-09-25 ENCOUNTER — Ambulatory Visit (INDEPENDENT_AMBULATORY_CARE_PROVIDER_SITE_OTHER): Payer: Medicare HMO | Admitting: Internal Medicine

## 2017-09-25 DIAGNOSIS — S20211A Contusion of right front wall of thorax, initial encounter: Secondary | ICD-10-CM

## 2017-09-25 DIAGNOSIS — K219 Gastro-esophageal reflux disease without esophagitis: Secondary | ICD-10-CM | POA: Diagnosis not present

## 2017-09-25 DIAGNOSIS — S2241XA Multiple fractures of ribs, right side, initial encounter for closed fracture: Secondary | ICD-10-CM | POA: Diagnosis not present

## 2017-09-25 MED ORDER — VITAMIN D3 50 MCG (2000 UT) PO CAPS
2000.0000 [IU] | ORAL_CAPSULE | Freq: Every day | ORAL | 3 refills | Status: DC
Start: 2017-09-25 — End: 2018-03-11

## 2017-09-25 MED ORDER — TRAMADOL HCL 50 MG PO TABS: 50.0000 mg | ORAL_TABLET | Freq: Four times a day (QID) | ORAL | 0 refills | Status: DC | PRN

## 2017-09-25 NOTE — Progress Notes (Signed)
Subjective:  Patient ID: Edwin Ramirez, male    DOB: [birth year]  Age: 77 y.o. MRN: [id number]  CC: No chief complaint on file.   HPI Edwin Ramirez presents for R CP after a fall on ice on Tue (skating). No LOC Pain is 5-6/10  Outpatient Medications Prior to Visit  Medication Sig Dispense Refill  . amoxicillin-clavulanate (AUGMENTIN) 875-125 MG tablet Take 1 tablet by mouth 2 (two) times daily. 28 tablet 0  . benzonatate (TESSALON) 100 MG capsule Take 1 capsule (100 mg total) by mouth 3 (three) times daily as needed. 20 capsule 0  . latanoprost (XALATAN) 0.005 % ophthalmic solution Place 1 drop into both eyes at bedtime.     Marland Kitchen LORazepam (ATIVAN) 1 MG tablet TAKE 1/2 TABLET BY MOUTH EVERY 8 HOURS AS NEEDED 90 tablet 3  . omeprazole (PRILOSEC) 40 MG capsule Take 1 capsule (40 mg total) by mouth 2 (two) times daily. 180 capsule 3  . polyethylene glycol powder (GLYCOLAX/MIRALAX) powder Take 1 Container by mouth once.    . rosuvastatin (CRESTOR) 10 MG tablet Take 1 tablet (10 mg total) by mouth daily. 90 tablet 3  . Simethicone 125 MG CAPS Take by mouth. Takes one capsule after meals and at bedtime    . traMADol (ULTRAM) 50 MG tablet Take 1 tablet (50 mg total) by mouth every 8 (eight) hours as needed for severe pain. 60 tablet 0   No facility-administered medications prior to visit.     ROS Review of Systems  Constitutional: Negative for appetite change, fatigue and unexpected weight change.  HENT: Negative for congestion, nosebleeds, sneezing, sore throat and trouble swallowing.   Eyes: Negative for itching and visual disturbance.  Respiratory: Negative for cough.   Cardiovascular: Positive for chest pain. Negative for palpitations and leg swelling.  Gastrointestinal: Negative for abdominal distention, blood in stool, diarrhea and nausea.  Genitourinary: Negative for frequency and hematuria.  Musculoskeletal: Negative for back pain, gait problem, joint swelling and neck  pain.  Skin: Negative for rash.  Neurological: Negative for dizziness, tremors, speech difficulty and weakness.  Psychiatric/Behavioral: Negative for agitation, dysphoric mood and sleep disturbance. The patient is not nervous/anxious.     Objective:  BP 118/62 (BP Location: Left Arm, Patient Position: Sitting, Cuff Size: Normal)   Pulse (!) 59   Temp 98 F (36.7 C) (Oral)   Ht 5\' 7"  (1.702 m)   Wt 167 lb (75.8 kg)   SpO2 98%   BMI 26.16 kg/m   BP Readings from Last 3 Encounters:  09/25/17 118/62  09/03/17 116/72  08/11/17 116/70    Wt Readings from Last 3 Encounters:  09/25/17 167 lb (75.8 kg)  09/03/17 165 lb (74.8 kg)  08/11/17 166 lb 1.9 oz (75.4 kg)    Physical Exam  Constitutional: He is oriented to person, place, and time. He appears well-developed. No distress.  NAD  HENT:  Mouth/Throat: Oropharynx is clear and moist.  Eyes: Pupils are equal, round, and reactive to light. Conjunctivae are normal.  Neck: Normal range of motion. No JVD present. No thyromegaly present.  Cardiovascular: Normal rate, regular rhythm, normal heart sounds and intact distal pulses. Exam reveals no gallop and no friction rub.  No murmur heard. Pulmonary/Chest: Effort normal and breath sounds normal. No respiratory distress. He has no wheezes. He has no rales. He exhibits no tenderness.  Abdominal: Soft. Bowel sounds are normal. He exhibits no distension and no mass. There is no tenderness. There is no rebound and  no guarding.  Musculoskeletal: Normal range of motion. He exhibits no edema or tenderness.  Lymphadenopathy:    He has no cervical adenopathy.  Neurological: He is alert and oriented to person, place, and time. He has normal reflexes. No cranial nerve deficit. He exhibits normal muscle tone. He displays a negative Romberg sign. Coordination and gait normal.  Skin: Skin is warm and dry. No rash noted.  Psychiatric: He has a normal mood and affect. His behavior is normal. Judgment and  thought content normal.  R antero-lat upper ribs - tender  Lab Results  Component Value Date   WBC 5.5 07/08/2016   HGB 14.9 07/08/2016   HCT 44.3 07/08/2016   PLT 162.0 07/08/2016   GLUCOSE 100 (H) 11/20/2016   CHOL 134 11/20/2016   TRIG 137.0 11/20/2016   HDL 37.90 (L) 11/20/2016   LDLDIRECT 78.8 02/05/2011   LDLCALC 69 11/20/2016   ALT 19 11/20/2016   AST 22 11/20/2016   NA 140 11/20/2016   K 4.7 11/20/2016   CL 106 11/20/2016   CREATININE 1.15 11/20/2016   BUN 17 11/20/2016   CO2 29 11/20/2016   TSH 0.68 03/01/2016   PSA 0.00 (L) 03/04/2017   HGBA1C 5.7 03/07/2014    Mr Brain W Wo Contrast  Result Date: 01/08/2017 CLINICAL DATA:  77 year old male with chronic diplopia when going down stairs. Fall with head injury on 12/08/2016. Creatinine was obtained on site at [facility] at [address] Results: Creatinine 1.0 mg/dL. EXAM: MRI HEAD WITHOUT AND WITH CONTRAST TECHNIQUE: Multiplanar, multiecho pulse sequences of the brain and surrounding structures were obtained without and with intravenous contrast. CONTRAST:  75mL MULTIHANCE GADOBENATE DIMEGLUMINE 529 MG/ML IV SOLN COMPARISON:  Cervical spine MRI 08/25/2006. FINDINGS: Brain: Cerebral volume is within normal limits for age. No restricted diffusion to suggest acute infarction. No midline shift, mass effect, evidence of mass lesion, ventriculomegaly, extra-axial collection or acute intracranial hemorrhage. Cervicomedullary junction and pituitary are within normal limits. Pearline Cables and white matter signal is within normal limits for age throughout the brain. No chronic cerebral blood products identified. No focal encephalomalacia. No abnormal enhancement identified. No dural thickening. Vascular: Major intracranial vascular flow voids appear normal. Skull and upper cervical spine: Negative. Normal bone marrow signal. Sinuses/Orbits: Postoperative changes to both globes. The orbits soft tissues otherwise appear normal. Normal  cavernous sinus. Trace paranasal sinus mucosal thickening. Other: Visible internal auditory structures appear normal. Mastoid air cells are clear. Negative scalp and visible face soft tissues. IMPRESSION: Normal for age MRI appearance of the brain. No explanation for diplopia. If there is a discrete visual system cranial neuropathy then a dedicated Orbit MRI without and with contrast may be valuable. Electronically Signed   By: Genevie Ann M.D.   On: 01/08/2017 12:22    Assessment & Plan:   There are no diagnoses linked to this encounter. I am having Marily Lente. Barbra Sarks "[name]" maintain his latanoprost, traMADol, Simethicone, polyethylene glycol powder, rosuvastatin, LORazepam, benzonatate, omeprazole, and amoxicillin-clavulanate.  No orders of the defined types were placed in this encounter.    Follow-up: No follow-ups on file.  Walker Kehr, MD

## 2017-09-25 NOTE — Assessment & Plan Note (Signed)
prilosec No NSADs

## 2017-09-25 NOTE — Patient Instructions (Signed)
Rib belt °

## 2017-09-25 NOTE — Assessment & Plan Note (Signed)
R/o rib fx Rib belt Tramadol prn X ray

## 2017-10-16 DIAGNOSIS — R69 Illness, unspecified: Secondary | ICD-10-CM | POA: Diagnosis not present

## 2017-12-16 DIAGNOSIS — R69 Illness, unspecified: Secondary | ICD-10-CM | POA: Diagnosis not present

## 2017-12-28 ENCOUNTER — Other Ambulatory Visit: Payer: Self-pay | Admitting: Internal Medicine

## 2018-01-21 DIAGNOSIS — H43813 Vitreous degeneration, bilateral: Secondary | ICD-10-CM | POA: Diagnosis not present

## 2018-01-21 DIAGNOSIS — Z961 Presence of intraocular lens: Secondary | ICD-10-CM | POA: Diagnosis not present

## 2018-01-21 DIAGNOSIS — H401131 Primary open-angle glaucoma, bilateral, mild stage: Secondary | ICD-10-CM | POA: Diagnosis not present

## 2018-01-21 DIAGNOSIS — H5007 Alternating esotropia with V pattern: Secondary | ICD-10-CM | POA: Diagnosis not present

## 2018-01-21 DIAGNOSIS — H532 Diplopia: Secondary | ICD-10-CM | POA: Diagnosis not present

## 2018-01-26 ENCOUNTER — Ambulatory Visit (INDEPENDENT_AMBULATORY_CARE_PROVIDER_SITE_OTHER): Payer: Medicare HMO | Admitting: Internal Medicine

## 2018-01-26 ENCOUNTER — Encounter: Payer: Self-pay | Admitting: Internal Medicine

## 2018-01-26 DIAGNOSIS — R21 Rash and other nonspecific skin eruption: Secondary | ICD-10-CM

## 2018-01-26 MED ORDER — DOXYCYCLINE HYCLATE 100 MG PO TABS: 100.0000 mg | ORAL_TABLET | Freq: Two times a day (BID) | ORAL | 0 refills | Status: DC

## 2018-01-26 NOTE — Patient Instructions (Signed)
We have sent in doxycycline 2 pills to take about 12 hours or so apart to prevent tick infection.

## 2018-01-26 NOTE — Assessment & Plan Note (Signed)
Doxycycline 1 day preventative course. No indication of lyme disease.

## 2018-01-26 NOTE — Progress Notes (Signed)
   Subjective:    Patient ID: Edwin Ramirez, male    DOB: [birth year], 77 y.o.   MRN: [id number]  HPI The patient is a 77 YO man coming in for tick bite and rash. They pulled off the tick yesterday evening from his stomach. He does not do much outdoors although he does walk outside. Denies knowing when the tick was on him. Denies fevers or chills. Does have hard spot on the area and red rash surrounding. No clearing or target rash.   Review of Systems  Constitutional: Negative.   Respiratory: Negative for cough, chest tightness and shortness of breath.   Cardiovascular: Negative for chest pain, palpitations and leg swelling.  Gastrointestinal: Negative for abdominal distention, abdominal pain, constipation, diarrhea, nausea and vomiting.  Musculoskeletal: Positive for myalgias.  Skin: Positive for rash.  Neurological: Negative.       Objective:   Physical Exam  Constitutional: He is oriented to person, place, and time. He appears well-developed and well-nourished.  HENT:  Head: Normocephalic and atraumatic.  Eyes: EOM are normal.  Neck: Normal range of motion.  Cardiovascular: Normal rate and regular rhythm.  Pulmonary/Chest: Effort normal and breath sounds normal. No respiratory distress. He has no wheezes. He has no rales.  Abdominal: Soft. Bowel sounds are normal. He exhibits no distension. There is no tenderness. There is no rebound.  Musculoskeletal: He exhibits no edema.  Neurological: He is alert and oriented to person, place, and time. Coordination normal.  Skin: Skin is warm and dry. Rash noted.  Red area with 2 punctate wounds on the right lower stomach. Surrounding erythema about 1-2 cm circular without clearing or target rash.   Vitals:   01/26/18 1556  BP: 110/60  Pulse: (!) 53  Temp: (!) 97.5 F (36.4 C)  TempSrc: Oral  SpO2: 95%  Weight: 166 lb (75.3 kg)  Height: 5\' 7"  (1.702 m)      Assessment & Plan:

## 2018-01-28 ENCOUNTER — Other Ambulatory Visit: Payer: Self-pay | Admitting: Optometry

## 2018-01-28 DIAGNOSIS — D23122 Other benign neoplasm of skin of left lower eyelid, including canthus: Secondary | ICD-10-CM | POA: Diagnosis not present

## 2018-01-28 DIAGNOSIS — L720 Epidermal cyst: Secondary | ICD-10-CM | POA: Diagnosis not present

## 2018-02-07 ENCOUNTER — Other Ambulatory Visit: Payer: Self-pay | Admitting: Internal Medicine

## 2018-03-05 ENCOUNTER — Encounter: Payer: Medicare HMO | Admitting: Internal Medicine

## 2018-03-11 ENCOUNTER — Other Ambulatory Visit (INDEPENDENT_AMBULATORY_CARE_PROVIDER_SITE_OTHER): Payer: Medicare HMO

## 2018-03-11 ENCOUNTER — Ambulatory Visit (INDEPENDENT_AMBULATORY_CARE_PROVIDER_SITE_OTHER): Payer: Medicare HMO | Admitting: Internal Medicine

## 2018-03-11 ENCOUNTER — Encounter: Payer: Self-pay | Admitting: Internal Medicine

## 2018-03-11 VITALS — BP 124/68 | HR 52 | Temp 98.2°F | Ht 67.0 in | Wt 163.0 lb

## 2018-03-11 DIAGNOSIS — Z8546 Personal history of malignant neoplasm of prostate: Secondary | ICD-10-CM | POA: Diagnosis not present

## 2018-03-11 DIAGNOSIS — Z23 Encounter for immunization: Secondary | ICD-10-CM | POA: Diagnosis not present

## 2018-03-11 DIAGNOSIS — Z0000 Encounter for general adult medical examination without abnormal findings: Secondary | ICD-10-CM

## 2018-03-11 DIAGNOSIS — E785 Hyperlipidemia, unspecified: Secondary | ICD-10-CM

## 2018-03-11 LAB — LIPID PANEL
CHOLESTEROL: 156 mg/dL (ref 0–200)
HDL: 33.7 mg/dL — AB (ref >39.00)
LDL CALC: 85 mg/dL (ref 0–99)
NonHDL: 121.87
TRIGLYCERIDES: 183 mg/dL — AB (ref 0.0–149.0)
Total CHOL/HDL Ratio: 5
VLDL: 36.6 mg/dL (ref 0.0–40.0)

## 2018-03-11 LAB — URINALYSIS
BILIRUBIN URINE: NEGATIVE
KETONES UR: NEGATIVE
Leukocytes, UA: NEGATIVE
Nitrite: NEGATIVE
PH: 5.5 (ref 5.0–8.0)
Specific Gravity, Urine: 1.025 (ref 1.000–1.030)
TOTAL PROTEIN, URINE-UPE24: NEGATIVE
Urine Glucose: NEGATIVE
Urobilinogen, UA: 0.2 (ref 0.0–1.0)

## 2018-03-11 LAB — BASIC METABOLIC PANEL
BUN: 15 mg/dL (ref 6–23)
CHLORIDE: 105 meq/L (ref 96–112)
CO2: 29 meq/L (ref 19–32)
CREATININE: 1.11 mg/dL (ref 0.40–1.50)
Calcium: 9.5 mg/dL (ref 8.4–10.5)
GFR: 67.85 mL/min (ref >60.00)
Glucose, Bld: 100 mg/dL — ABNORMAL HIGH (ref 70–99)
Potassium: 4.3 mEq/L (ref 3.5–5.1)
SODIUM: 141 meq/L (ref 135–145)

## 2018-03-11 LAB — HEPATIC FUNCTION PANEL
ALBUMIN: 4.4 g/dL (ref 3.5–5.2)
ALK PHOS: 63 U/L (ref 39–117)
ALT: 16 U/L (ref 0–53)
AST: 21 U/L (ref 0–37)
Bilirubin, Direct: 0.2 mg/dL (ref 0.0–0.3)
Total Bilirubin: 1.5 mg/dL — ABNORMAL HIGH (ref 0.2–1.2)
Total Protein: 6.8 g/dL (ref 6.0–8.3)

## 2018-03-11 LAB — CBC WITH DIFFERENTIAL/PLATELET
BASOS PCT: 0.9 % (ref 0.0–3.0)
Basophils Absolute: 0 10*3/uL (ref 0.0–0.1)
EOS ABS: 0.1 10*3/uL (ref 0.0–0.7)
Eosinophils Relative: 2.7 % (ref 0.0–5.0)
HCT: 43.8 % (ref 39.0–52.0)
Hemoglobin: 14.6 g/dL (ref 13.0–17.0)
LYMPHS ABS: 1 10*3/uL (ref 0.7–4.0)
Lymphocytes Relative: 22.1 % (ref 12.0–46.0)
MCHC: 33.4 g/dL (ref 30.0–36.0)
MCV: 95.8 fl (ref 78.0–100.0)
MONO ABS: 0.5 10*3/uL (ref 0.1–1.0)
Monocytes Relative: 11.3 % (ref 3.0–12.0)
NEUTROS ABS: 2.8 10*3/uL (ref 1.4–7.7)
Neutrophils Relative %: 63 % (ref 43.0–77.0)
PLATELETS: 149 10*3/uL — AB (ref 150.0–400.0)
RBC: 4.57 Mil/uL (ref 4.22–5.81)
RDW: 13.6 % (ref 11.5–15.5)
WBC: 4.5 10*3/uL (ref 4.0–10.5)

## 2018-03-11 LAB — PSA: PSA: 0 ng/mL — ABNORMAL LOW (ref 0.10–4.00)

## 2018-03-11 LAB — TSH: TSH: 1.78 u[IU]/mL (ref 0.35–4.50)

## 2018-03-11 MED ORDER — CLOTRIMAZOLE-BETAMETHASONE 1-0.05 % EX CREA: 1.0000 "application " | TOPICAL_CREAM | Freq: Two times a day (BID) | CUTANEOUS | 1 refills | Status: DC

## 2018-03-11 NOTE — Assessment & Plan Note (Addendum)
  Here for medicare wellness/physical  Diet: heart healthy  Physical activity: not sedentary  Depression/mood screen: negative  Hearing: intact to whispered voice w/hearing aids Visual acuity: grossly normal, performs annual eye exam  ADLs: capable  Fall risk: none  Home safety: good  Cognitive evaluation: intact to orientation, naming, recall and repetition  EOL planning: adv directives, full code/ I agree  I have personally reviewed and have noted  1. The patient's medical, surgical and social history  2. Their use of alcohol, tobacco or illicit drugs  3. Their current medications and supplements  4. The patient's functional ability including ADL's, fall risks, home safety risks and hearing or visual impairment.  5. Diet and physical activities  6. Evidence for depression or mood disorders 7. The roster of all physicians providing medical care to patient - is listed in the Snapshot section of the chart and reviewed today.    Today patient counseled on age appropriate routine health concerns for screening and prevention, each reviewed and up to date or declined. Immunizations reviewed and up to date or declined. Labs ordered and reviewed. Risk factors for depression reviewed and negative. Hearing function and visual acuity are intact. ADLs screened and addressed as needed. Functional ability and level of safety reviewed and appropriate. Education, counseling and referrals performed based on assessed risks today. Patient provided with a copy of personalized plan for preventive services.  Vaccines Labs Colon due 2024 Advanced directives discussed

## 2018-03-11 NOTE — Patient Instructions (Signed)

## 2018-03-11 NOTE — Progress Notes (Signed)
Subjective:  Patient ID: Edwin Ramirez, male    DOB: 20-Mar-1941  Age: 77 y.o. MRN: [id number]  CC: No chief complaint on file.   HPI MICHIAL DISNEY presents for well exam C/o groin rash  Outpatient Medications Prior to Visit  Medication Sig Dispense Refill  . latanoprost (XALATAN) 0.005 % ophthalmic solution Place 1 drop into both eyes at bedtime.     Marland Kitchen LORazepam (ATIVAN) 1 MG tablet Take 1 tablet (1 mg total) by mouth every 8 (eight) hours as needed for anxiety. 90 tablet 1  . omeprazole (PRILOSEC) 40 MG capsule Take 1 capsule (40 mg total) by mouth 2 (two) times daily. 180 capsule 3  . polyethylene glycol powder (GLYCOLAX/MIRALAX) powder Take 1 Container by mouth once.    . rosuvastatin (CRESTOR) 10 MG tablet TAKE 1 TABLET BY MOUTH EVERY DAY 90 tablet 3  . Simethicone 125 MG CAPS Take by mouth. Takes one capsule after meals and at bedtime    . traMADol (ULTRAM) 50 MG tablet Take 1 tablet (50 mg total) by mouth every 6 (six) hours as needed for severe pain. 20 tablet 0  . Cholecalciferol (VITAMIN D3) 2000 units capsule Take 1 capsule (2,000 Units total) by mouth daily. 100 capsule 3  . doxycycline (VIBRA-TABS) 100 MG tablet Take 1 tablet (100 mg total) by mouth 2 (two) times daily. 2 tablet 0   No facility-administered medications prior to visit.     ROS: Review of Systems  Constitutional: Negative for appetite change, fatigue and unexpected weight change.  HENT: Negative for congestion, nosebleeds, sneezing, sore throat and trouble swallowing.   Eyes: Negative for itching and visual disturbance.  Respiratory: Negative for cough.   Cardiovascular: Negative for chest pain, palpitations and leg swelling.  Gastrointestinal: Negative for abdominal distention, blood in stool, diarrhea and nausea.  Genitourinary: Negative for frequency and hematuria.  Musculoskeletal: Negative for back pain, gait problem, joint swelling and neck pain.  Skin: Negative for rash.    Neurological: Negative for dizziness, tremors, speech difficulty and weakness.  Psychiatric/Behavioral: Negative for agitation, dysphoric mood and sleep disturbance. The patient is not nervous/anxious.     Objective:  BP 124/68 (BP Location: Left Arm, Patient Position: Sitting, Cuff Size: Normal)   Pulse (!) 52   Temp 98.2 F (36.8 C) (Oral)   Ht 5\' 7"  (1.702 m)   Wt 163 lb (73.9 kg)   SpO2 96%   BMI 25.53 kg/m   BP Readings from Last 3 Encounters:  03/11/18 124/68  01/26/18 110/60  09/25/17 118/62    Wt Readings from Last 3 Encounters:  03/11/18 163 lb (73.9 kg)  01/26/18 166 lb (75.3 kg)  09/25/17 167 lb (75.8 kg)    Physical Exam  Constitutional: He is oriented to person, place, and time. He appears well-developed. No distress.  NAD  HENT:  Mouth/Throat: Oropharynx is clear and moist.  Eyes: Pupils are equal, round, and reactive to light. Conjunctivae are normal.  Neck: Normal range of motion. No JVD present. No thyromegaly present.  Cardiovascular: Normal rate, regular rhythm, normal heart sounds and intact distal pulses. Exam reveals no gallop and no friction rub.  No murmur heard. Pulmonary/Chest: Effort normal and breath sounds normal. No respiratory distress. He has no wheezes. He has no rales. He exhibits no tenderness.  Abdominal: Soft. Bowel sounds are normal. He exhibits no distension and no mass. There is no tenderness. There is no rebound and no guarding.  Musculoskeletal: Normal range of motion. He exhibits  no edema or tenderness.  Lymphadenopathy:    He has no cervical adenopathy.  Neurological: He is alert and oriented to person, place, and time. He has normal reflexes. No cranial nerve deficit. He exhibits normal muscle tone. He displays a negative Romberg sign. Coordination and gait normal.  Skin: Skin is warm and dry. No rash noted.  Psychiatric: He has a normal mood and affect. His behavior is normal. Judgment and thought content normal.  scrotum  w/eryth papules Prostate absent, rect declined. Colon 2014  Lab Results  Component Value Date   WBC 5.5 07/08/2016   HGB 14.9 07/08/2016   HCT 44.3 07/08/2016   PLT 162.0 07/08/2016   GLUCOSE 100 (H) 11/20/2016   CHOL 134 11/20/2016   TRIG 137.0 11/20/2016   HDL 37.90 (L) 11/20/2016   LDLDIRECT 78.8 02/05/2011   LDLCALC 69 11/20/2016   ALT 19 11/20/2016   AST 22 11/20/2016   NA 140 11/20/2016   K 4.7 11/20/2016   CL 106 11/20/2016   CREATININE 1.15 11/20/2016   BUN 17 11/20/2016   CO2 29 11/20/2016   TSH 0.68 03/01/2016   PSA 0.00 (L) 03/04/2017   HGBA1C 5.7 03/07/2014    Dg Ribs Unilateral Right  Result Date: 09/25/2017 CLINICAL DATA:  Acute RIGHT rib pain following fall 3 days ago. Initial encounter. EXAM: RIGHT RIBS - 2 VIEW COMPARISON:  04/26/2016 and prior exam FINDINGS: Nondisplaced fractures of the anterolateral RIGHT fifth through ninth ribs noted. There is no evidence of RIGHT lung airspace disease, pleural effusion or pneumothorax. No other significant abnormalities identified. IMPRESSION: Nondisplaced fractures of the RIGHT fifth through ninth ribs. No pleural effusion or pneumothorax. Electronically Signed   By: Margarette Canada M.D.   On: 09/25/2017 14:41    Assessment & Plan:   There are no diagnoses linked to this encounter.   No orders of the defined types were placed in this encounter.    Follow-up: No follow-ups on file.  Walker Kehr, MD

## 2018-03-11 NOTE — Assessment & Plan Note (Signed)
PSA

## 2018-04-23 DIAGNOSIS — R69 Illness, unspecified: Secondary | ICD-10-CM | POA: Diagnosis not present

## 2018-05-12 ENCOUNTER — Ambulatory Visit (INDEPENDENT_AMBULATORY_CARE_PROVIDER_SITE_OTHER)
Admission: RE | Admit: 2018-05-12 | Discharge: 2018-05-12 | Disposition: A | Discharge status: Home / Self Care | Payer: Medicare HMO | Source: Ambulatory Visit | Attending: Internal Medicine | Admitting: Internal Medicine

## 2018-05-12 ENCOUNTER — Encounter: Payer: Self-pay | Admitting: Internal Medicine

## 2018-05-12 ENCOUNTER — Ambulatory Visit (INDEPENDENT_AMBULATORY_CARE_PROVIDER_SITE_OTHER): Payer: Medicare HMO | Admitting: Internal Medicine

## 2018-05-12 DIAGNOSIS — J069 Acute upper respiratory infection, unspecified: Secondary | ICD-10-CM | POA: Diagnosis not present

## 2018-05-12 DIAGNOSIS — Z8546 Personal history of malignant neoplasm of prostate: Secondary | ICD-10-CM

## 2018-05-12 DIAGNOSIS — E785 Hyperlipidemia, unspecified: Secondary | ICD-10-CM | POA: Diagnosis not present

## 2018-05-12 DIAGNOSIS — R0789 Other chest pain: Secondary | ICD-10-CM

## 2018-05-12 DIAGNOSIS — R0781 Pleurodynia: Secondary | ICD-10-CM | POA: Diagnosis not present

## 2018-05-12 MED ORDER — AZITHROMYCIN 250 MG PO TABS
ORAL_TABLET | ORAL | 0 refills | Status: DC
Start: 2018-05-12 — End: 2018-06-19

## 2018-05-12 MED ORDER — CLOTRIMAZOLE-BETAMETHASONE 1-0.05 % EX CREA: 1.0000 "application " | TOPICAL_CREAM | Freq: Two times a day (BID) | CUTANEOUS | 1 refills | Status: AC

## 2018-05-12 MED ORDER — TRAMADOL HCL 50 MG PO TABS: 50.0000 mg | ORAL_TABLET | Freq: Four times a day (QID) | ORAL | 0 refills | Status: DC | PRN

## 2018-05-12 NOTE — Assessment & Plan Note (Signed)
Zpac 

## 2018-05-12 NOTE — Progress Notes (Addendum)
Subjective:  Patient ID: Edwin Ramirez, male    DOB: 1941/03/04  Age: 77 y.o. MRN: [id number]  CC: No chief complaint on file.   HPI Edwin Ramirez presents for R posterior CP wose w/ROM x 2-3 weeks. He had the same pain in Feb 2018. Then he fell on ice in April 2019 - rib fx's.  No SOB. C/o URI sx's x 1 wk - worse   Outpatient Medications Prior to Visit  Medication Sig Dispense Refill  . clotrimazole-betamethasone (LOTRISONE) cream Apply 1 application topically 2 (two) times daily. 45 g 1  . latanoprost (XALATAN) 0.005 % ophthalmic solution Place 1 drop into both eyes at bedtime.     Marland Kitchen LORazepam (ATIVAN) 1 MG tablet Take 1 tablet (1 mg total) by mouth every 8 (eight) hours as needed for anxiety. 90 tablet 1  . omeprazole (PRILOSEC) 40 MG capsule Take 1 capsule (40 mg total) by mouth 2 (two) times daily. 180 capsule 3  . polyethylene glycol powder (GLYCOLAX/MIRALAX) powder Take 1 Container by mouth once.    . rosuvastatin (CRESTOR) 10 MG tablet TAKE 1 TABLET BY MOUTH EVERY DAY 90 tablet 3  . Simethicone 125 MG CAPS Take by mouth. Takes one capsule after meals and at bedtime    . traMADol (ULTRAM) 50 MG tablet Take 1 tablet (50 mg total) by mouth every 6 (six) hours as needed for severe pain. 20 tablet 0   No facility-administered medications prior to visit.     ROS: Review of Systems  Constitutional: Negative for appetite change, fatigue and unexpected weight change.  HENT: Negative for congestion, nosebleeds, sneezing, sore throat and trouble swallowing.   Eyes: Negative for itching and visual disturbance.  Respiratory: Negative for cough.   Cardiovascular: Positive for chest pain. Negative for palpitations and leg swelling.  Gastrointestinal: Negative for abdominal distention, blood in stool, diarrhea and nausea.  Genitourinary: Negative for frequency and hematuria.  Musculoskeletal: Negative for back pain, gait problem, joint swelling and neck pain.  Skin: Negative  for rash.  Neurological: Negative for dizziness, tremors, speech difficulty and weakness.  Psychiatric/Behavioral: Negative for agitation, dysphoric mood, sleep disturbance and suicidal ideas. The patient is not nervous/anxious.     Objective:  BP 122/66 (BP Location: Left Arm, Patient Position: Sitting, Cuff Size: Normal)   Pulse (!) 57   Temp 98.2 F (36.8 C) (Oral)   Ht 5\' 7"  (1.702 m)   Wt 163 lb (73.9 kg)   SpO2 95%   BMI 25.53 kg/m   BP Readings from Last 3 Encounters:  05/12/18 122/66  03/11/18 124/68  01/26/18 110/60    Wt Readings from Last 3 Encounters:  05/12/18 163 lb (73.9 kg)  03/11/18 163 lb (73.9 kg)  01/26/18 166 lb (75.3 kg)    Physical Exam  Constitutional: He is oriented to person, place, and time. He appears well-developed. No distress.  NAD  HENT:  Mouth/Throat: Oropharynx is clear and moist.  Eyes: Pupils are equal, round, and reactive to light. Conjunctivae are normal.  Neck: Normal range of motion. No JVD present. No thyromegaly present.  Cardiovascular: Normal rate, regular rhythm, normal heart sounds and intact distal pulses. Exam reveals no gallop and no friction rub.  No murmur heard. Pulmonary/Chest: Effort normal and breath sounds normal. No respiratory distress. He has no wheezes. He has no rales. He exhibits no tenderness.  Abdominal: Soft. Bowel sounds are normal. He exhibits no distension and no mass. There is no tenderness. There is no rebound  and no guarding.  Musculoskeletal: Normal range of motion. He exhibits tenderness. He exhibits no edema.  Lymphadenopathy:    He has no cervical adenopathy.  Neurological: He is alert and oriented to person, place, and time. He has normal reflexes. No cranial nerve deficit. He exhibits normal muscle tone. He displays a negative Romberg sign. Coordination and gait normal.  Skin: Skin is warm and dry. No rash noted.  Psychiatric: He has a normal mood and affect. His behavior is normal. Judgment and  thought content normal.    Lab Results  Component Value Date   WBC 4.5 03/11/2018   HGB 14.6 03/11/2018   HCT 43.8 03/11/2018   PLT 149.0 (L) 03/11/2018   GLUCOSE 100 (H) 03/11/2018   CHOL 156 03/11/2018   TRIG 183.0 (H) 03/11/2018   HDL 33.70 (L) 03/11/2018   LDLDIRECT 78.8 02/05/2011   LDLCALC 85 03/11/2018   ALT 16 03/11/2018   AST 21 03/11/2018   NA 141 03/11/2018   K 4.3 03/11/2018   CL 105 03/11/2018   CREATININE 1.11 03/11/2018   BUN 15 03/11/2018   CO2 29 03/11/2018   TSH 1.78 03/11/2018   PSA 0.00 (L) 03/11/2018   HGBA1C 5.7 03/07/2014    Dg Ribs Unilateral Right  Result Date: 09/25/2017 CLINICAL DATA:  Acute RIGHT rib pain following fall 3 days ago. Initial encounter. EXAM: RIGHT RIBS - 2 VIEW COMPARISON:  04/26/2016 and prior exam FINDINGS: Nondisplaced fractures of the anterolateral RIGHT fifth through ninth ribs noted. There is no evidence of RIGHT lung airspace disease, pleural effusion or pneumothorax. No other significant abnormalities identified. IMPRESSION: Nondisplaced fractures of the RIGHT fifth through ninth ribs. No pleural effusion or pneumothorax. Electronically Signed   By: Margarette Canada M.D.   On: 09/25/2017 14:41    Assessment & Plan:   There are no diagnoses linked to this encounter.   No orders of the defined types were placed in this encounter.    Follow-up: No follow-ups on file.  Walker Kehr, MD

## 2018-05-12 NOTE — Assessment & Plan Note (Addendum)
R posterior CP wose w/ROM x 2-3 weeks. He had the same pain in Feb 2018. Then he fell on ice in April 2019 - rib fx's.    Rib X ray Chest CT Tramadol prn  Potential benefits of a short or long term opioids use as well as potential risks (i.e. addiction risk, apnea etc) and complications (i.e. Somnolence, constipation and others) were explained to the patient and were aknowledged.

## 2018-05-12 NOTE — Assessment & Plan Note (Signed)
Last PSA was ok

## 2018-05-12 NOTE — Addendum Note (Signed)
Addended by: Cassandria Anger on: 05/12/2018 12:23 PM   Modules accepted: Orders

## 2018-05-12 NOTE — Patient Instructions (Signed)

## 2018-05-26 ENCOUNTER — Ambulatory Visit (INDEPENDENT_AMBULATORY_CARE_PROVIDER_SITE_OTHER): Payer: Medicare HMO | Admitting: Internal Medicine

## 2018-05-26 ENCOUNTER — Encounter: Payer: Self-pay | Admitting: Internal Medicine

## 2018-05-26 ENCOUNTER — Other Ambulatory Visit (INDEPENDENT_AMBULATORY_CARE_PROVIDER_SITE_OTHER): Payer: Medicare HMO

## 2018-05-26 DIAGNOSIS — R1011 Right upper quadrant pain: Secondary | ICD-10-CM

## 2018-05-26 LAB — H. PYLORI ANTIBODY, IGG: H PYLORI IGG: NEGATIVE

## 2018-05-26 LAB — BASIC METABOLIC PANEL
BUN: 17 mg/dL (ref 6–23)
CHLORIDE: 105 meq/L (ref 96–112)
CO2: 27 mEq/L (ref 19–32)
Calcium: 9.3 mg/dL (ref 8.4–10.5)
Creatinine, Ser: 1.12 mg/dL (ref 0.40–1.50)
GFR: 67.11 mL/min (ref >60.00)
Glucose, Bld: 105 mg/dL — ABNORMAL HIGH (ref 70–99)
POTASSIUM: 4.2 meq/L (ref 3.5–5.1)
Sodium: 138 mEq/L (ref 135–145)

## 2018-05-26 LAB — HEPATIC FUNCTION PANEL
ALT: 19 U/L (ref 0–53)
AST: 20 U/L (ref 0–37)
Albumin: 4.4 g/dL (ref 3.5–5.2)
Alkaline Phosphatase: 73 U/L (ref 39–117)
Bilirubin, Direct: 0.2 mg/dL (ref 0.0–0.3)
Total Bilirubin: 1 mg/dL (ref 0.2–1.2)
Total Protein: 7 g/dL (ref 6.0–8.3)

## 2018-05-26 LAB — LIPASE: Lipase: 36 U/L (ref 11.0–59.0)

## 2018-05-26 MED ORDER — OMEPRAZOLE 40 MG PO CPDR: 40.0000 mg | DELAYED_RELEASE_CAPSULE | Freq: Two times a day (BID) | ORAL | 3 refills | Status: DC

## 2018-05-26 MED ORDER — LORAZEPAM 1 MG PO TABS: 1.0000 mg | ORAL_TABLET | Freq: Three times a day (TID) | ORAL | 1 refills | Status: DC | PRN

## 2018-05-26 NOTE — Patient Instructions (Signed)
  Gluten free trial for 4-6 weeks. OK to use gluten-free bread and gluten-free pasta.    Gluten-Free Diet for Celiac Disease, Adult The gluten-free diet includes all foods that do not contain gluten. Gluten is a protein that is found in wheat, rye, barley, and some other grains. Following the gluten-free diet is the only treatment for people with celiac disease. It helps to prevent damage to the intestines and improves or eliminates the symptoms of celiac disease. Following the gluten-free diet requires some planning. It can be challenging at first, but it gets easier with time and practice. There are more gluten-free options available today than ever before. If you need help finding gluten-free foods or if you have questions, talk with your diet and nutrition specialist (registered dietitian) or your health care provider. What do I need to know about a gluten-free diet?  All fruits, vegetables, and meats are safe to eat and do not contain gluten.  When grocery shopping, start by shopping in the produce, meat, and dairy sections. These sections are more likely to contain gluten-free foods. Then move to the aisles that contain packaged foods if you need to.  Read all food labels. Gluten is often added to foods. Always check the ingredient list and look for warnings, such as "may contain gluten."  Talk with your dietitian or health care provider before taking a gluten-free multivitamin or mineral supplement.  Be aware of gluten-free foods having contact with foods that contain gluten (cross-contamination). This can happen at home and with any processed foods. ? Talk with your health care provider or dietitian about how to reduce the risk of cross-contamination in your home. ? If you have questions about how a food is processed, ask the manufacturer. What key words help to identify gluten? Foods that list any of these key words on the label usually contain gluten:  Wheat, flour, enriched  flour, bromated flour, white flour, durum flour, graham flour, phosphated flour, self-rising flour, semolina, farina, barley (malt), rye, and oats.  Starch, dextrin, modified food starch, or cereal.  Thickening, fillers, or emulsifiers.  Malt flavoring, malt extract, or malt syrup.  Hydrolyzed vegetable protein.  In the [place] packaged foods that are gluten-free are required to be labeled "GF." These foods should be easy to identify and are safe to eat. In the [place] food companies are also required to list common food allergens, including wheat, on their labels. Recommended foods Grains  Amaranth, bean flours, 100% buckwheat flour, corn, millet, nut flours or nut meals, GF oats, quinoa, rice, sorghum, teff, rice wafers, pure cornmeal tortillas, popcorn, and hot cereals made from cornmeal. Hominy, rice, wild rice. Some Asian rice noodles or bean noodles. Arrowroot starch, corn bran, corn flour, corn germ, cornmeal, corn starch, potato flour, potato starch flour, and rice bran. Plain, brown, and sweet rice flours. Rice polish, soy flour, and tapioca starch. Vegetables  All plain fresh, frozen, and canned vegetables. Fruits  All plain fresh, frozen, canned, and dried fruits, and 100% fruit juices. Meats and other protein foods  All fresh beef, pork, poultry, fish, seafood, and eggs. Fish canned in water, oil, brine, or vegetable broth. Plain nuts and seeds, peanut butter. Some lunch meat and some frankfurters. Dried beans, dried peas, and lentils. Dairy  Fresh plain, dry, evaporated, or condensed milk. Cream, butter, sour cream, whipping cream, and most yogurts. Unprocessed cheese, most processed cheeses, some cottage cheese, some cream cheeses. Beverages  Coffee, tea, most herbal teas. Carbonated beverages and some root beers.   Wine, sake, and distilled spirits, such as gin, vodka, and whiskey. Most hard ciders. Fats and oils  Butter, margarine, vegetable oil, hydrogenated butter, olive  oil, shortening, lard, cream, and some mayonnaise. Some commercial salad dressings. Olives. Sweets and desserts  Sugar, honey, some syrups, molasses, jelly, and jam. Plain hard candy, marshmallows, and gumdrops. Pure cocoa powder. Plain chocolate. Custard and some pudding mixes. Gelatin desserts, sorbets, frozen ice pops, and sherbet. Cake, cookies, and other desserts prepared with allowed flours. Some commercial ice creams. Cornstarch, tapioca, and rice puddings. Seasoning and other foods  Some canned or frozen soups. Monosodium glutamate (MSG). Cider, rice, and wine vinegar. Baking soda and baking powder. Cream of tartar. Baking and nutritional yeast. Certain soy sauces made without wheat (ask your dietitian about specific brands that are allowed). Nuts, coconut, and chocolate. Salt, pepper, herbs, spices, flavoring extracts, imitation or artificial flavorings, natural flavorings, and food colorings. Some medicines and supplements. Some lip glosses and other cosmetics. Rice syrups. The items listed may not be a complete list. Talk with your dietitian about what dietary choices are best for you. Foods to avoid Grains  Barley, bran, bulgur, couscous, cracked wheat, [place], farro, graham, malt, matzo, semolina, wheat germ, and all wheat and rye cereals including spelt and kamut. Cereals containing malt as a flavoring, such as rice cereal. Noodles, spaghetti, macaroni, most packaged rice mixes, and all mixes containing wheat, rye, barley, or triticale. Vegetables  Most creamed vegetables and most vegetables canned in sauces. Some commercially prepared vegetables and salads. Fruits  Thickened or prepared fruits and some pie fillings. Some fruit snacks and fruit roll-ups. Meats and other protein foods  Any meat or meat alternative containing wheat, rye, barley, or gluten stabilizers. These are often marinated or packaged meats and lunch meats. Bread-containing products, such as Swiss steak,  croquettes, meatballs, and meatloaf. Most tuna canned in vegetable broth and turkey with hydrolyzed vegetable protein (HVP) injected as part of the basting. Seitan. Imitation fish. Eggs in sauces made from ingredients to avoid. Dairy  Commercial chocolate milk drinks and malted milk. Some non-dairy creamers. Any cheese product containing ingredients to avoid. Beverages  Certain cereal beverages. Beer, ale, malted milk, and some root beers. Some hard ciders. Some instant flavored coffees. Some herbal teas made with barley or with barley malt added. Fats and oils  Some commercial salad dressings. Sour cream containing modified food starch. Sweets and desserts  Some toffees. Chocolate-coated nuts (may be rolled in wheat flour) and some commercial candies and candy bars. Most cakes, cookies, donuts, pastries, and other baked goods. Some commercial ice cream. Ice cream cones. Commercially prepared mixes for cakes, cookies, and other desserts. Bread pudding and other puddings thickened with flour. Products containing brown rice syrup made with barley malt enzyme. Desserts and sweets made with malt flavoring. Seasoning and other foods  Some curry powders, some dry seasoning mixes, some gravy extracts, some meat sauces, some ketchups, some prepared mustards, and horseradish. Certain soy sauces. Malt vinegar. Bouillon and bouillon cubes that contain HVP. Some chip dips, and some chewing gum. Yeast extract. Brewer's yeast. Caramel color. Some medicines and supplements. Some lip glosses and other cosmetics. The items listed may not be a complete list. Talk with your dietitian about what dietary choices are best for you. Summary  Gluten is a protein that is found in wheat, rye, barley, and some other grains. The gluten-free diet includes all foods that do not contain gluten.  If you need help finding gluten-free foods or if   you have questions, talk with your diet and nutrition specialist (registered  dietitian) or your health care provider.  Read all food labels. Gluten is often added to foods. Always check the ingredient list and look for warnings, such as "may contain gluten." This information is not intended to replace advice given to you by your health care provider. Make sure you discuss any questions you have with your health care provider. Document Released: 05/27/2005 Document Revised: 03/11/2016 Document Reviewed: 03/11/2016 Elsevier Interactive Patient Education  2018 [employer] Inc[employer]   

## 2018-05-26 NOTE — Progress Notes (Signed)
Subjective:  Patient ID: Edwin Ramirez, male    DOB: 05-02-1941  Age: 77 y.o. MRN: [id number]  CC: No chief complaint on file.   HPI Edwin Ramirez presents for recurrent RUQ pain after large meals, eating chocolate. Heat helps. He is s/p cholecystectomy 30 years ago  Outpatient Medications Prior to Visit  Medication Sig Dispense Refill  . azithromycin (ZITHROMAX Z-PAK) 250 MG tablet As directed 6 tablet 0  . clotrimazole-betamethasone (LOTRISONE) cream Apply 1 application topically 2 (two) times daily. 45 g 1  . latanoprost (XALATAN) 0.005 % ophthalmic solution Place 1 drop into both eyes at bedtime.     Marland Kitchen LORazepam (ATIVAN) 1 MG tablet Take 1 tablet (1 mg total) by mouth every 8 (eight) hours as needed for anxiety. 90 tablet 1  . omeprazole (PRILOSEC) 40 MG capsule Take 1 capsule (40 mg total) by mouth 2 (two) times daily. 180 capsule 3  . polyethylene glycol powder (GLYCOLAX/MIRALAX) powder Take 1 Container by mouth once.    . rosuvastatin (CRESTOR) 10 MG tablet TAKE 1 TABLET BY MOUTH EVERY DAY 90 tablet 3  . Simethicone 125 MG CAPS Take by mouth. Takes one capsule after meals and at bedtime    . traMADol (ULTRAM) 50 MG tablet Take 1 tablet (50 mg total) by mouth every 6 (six) hours as needed for severe pain. 60 tablet 0   No facility-administered medications prior to visit.     ROS: Review of Systems  Constitutional: Negative for appetite change, fatigue, fever and unexpected weight change.  HENT: Negative for congestion, nosebleeds, sneezing, sore throat and trouble swallowing.   Eyes: Negative for itching and visual disturbance.  Respiratory: Negative for cough.   Cardiovascular: Negative for chest pain, palpitations and leg swelling.  Gastrointestinal: Positive for abdominal pain. Negative for abdominal distention, blood in stool, diarrhea, nausea and vomiting.  Genitourinary: Negative for frequency and hematuria.  Musculoskeletal: Negative for back pain, gait  problem, joint swelling and neck pain.  Skin: Negative for rash.  Neurological: Negative for dizziness, tremors, speech difficulty and weakness.  Psychiatric/Behavioral: Negative for agitation, dysphoric mood and sleep disturbance. The patient is not nervous/anxious.     Objective:  BP 124/62 (BP Location: Left Arm, Patient Position: Sitting, Cuff Size: Normal)   Pulse 64   Temp (!) 97.5 F (36.4 C) (Oral)   Ht 5\' 7"  (1.702 m)   Wt 164 lb (74.4 kg)   SpO2 94%   BMI 25.69 kg/m   BP Readings from Last 3 Encounters:  05/26/18 124/62  05/12/18 122/66  03/11/18 124/68    Wt Readings from Last 3 Encounters:  05/26/18 164 lb (74.4 kg)  05/12/18 163 lb (73.9 kg)  03/11/18 163 lb (73.9 kg)    Physical Exam Constitutional:      General: He is not in acute distress.    Appearance: He is well-developed.     Comments: NAD  Eyes:     Conjunctiva/sclera: Conjunctivae normal.     Pupils: Pupils are equal, round, and reactive to light.  Neck:     Musculoskeletal: Normal range of motion.     Thyroid: No thyromegaly.     Vascular: No JVD.  Cardiovascular:     Rate and Rhythm: Normal rate and regular rhythm.     Heart sounds: Normal heart sounds. No murmur. No friction rub. No gallop.   Pulmonary:     Effort: Pulmonary effort is normal. No respiratory distress.     Breath sounds: Normal breath sounds.  No wheezing or rales.  Chest:     Chest wall: No tenderness.  Abdominal:     General: Bowel sounds are normal. There is no distension.     Palpations: Abdomen is soft. There is no mass.     Tenderness: There is no abdominal tenderness. There is no guarding or rebound.  Musculoskeletal: Normal range of motion.        General: No tenderness.  Lymphadenopathy:     Cervical: No cervical adenopathy.  Skin:    General: Skin is warm and dry.     Findings: No rash.  Neurological:     Mental Status: He is alert and oriented to person, place, and time.     Cranial Nerves: No cranial  nerve deficit.     Motor: No abnormal muscle tone.     Coordination: Coordination normal.     Gait: Gait normal.     Deep Tendon Reflexes: Reflexes are normal and symmetric.  Psychiatric:        Behavior: Behavior normal.        Thought Content: Thought content normal.        Judgment: Judgment normal.     Lab Results  Component Value Date   WBC 4.5 03/11/2018   HGB 14.6 03/11/2018   HCT 43.8 03/11/2018   PLT 149.0 (L) 03/11/2018   GLUCOSE 100 (H) 03/11/2018   CHOL 156 03/11/2018   TRIG 183.0 (H) 03/11/2018   HDL 33.70 (L) 03/11/2018   LDLDIRECT 78.8 02/05/2011   LDLCALC 85 03/11/2018   ALT 16 03/11/2018   AST 21 03/11/2018   NA 141 03/11/2018   K 4.3 03/11/2018   CL 105 03/11/2018   CREATININE 1.11 03/11/2018   BUN 15 03/11/2018   CO2 29 03/11/2018   TSH 1.78 03/11/2018   PSA 0.00 (L) 03/11/2018   HGBA1C 5.7 03/07/2014    Dg Ribs Unilateral Right  Result Date: 05/12/2018 CLINICAL DATA:  Persistent right ribcage discomfort. History of rib fractures 6 months ago. EXAM: RIGHT RIBS - 2 VIEW COMPARISON:  Right rib series of September 25, 2017 FINDINGS: The previously demonstrated fractures have healed with minimal deformity. No acute fracture is observed. There is no lytic nor blastic lesion. The interstitial markings of the right lung are coarse though stable. There is no pleural effusion. 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 IMPRESSION: Old rib fractures on the right with minimal deformity. No acute right rib abnormality. Electronically Signed   By: David  Martinique M.D.   On: 05/12/2018 15:42    Assessment & Plan:   There are no diagnoses linked to this encounter.   No orders of the defined types were placed in this encounter.    Follow-up: No follow-ups on file.  Walker Kehr, MD

## 2018-05-26 NOTE — Assessment & Plan Note (Signed)
RUQ Labs Guten free diet GI ref Dr Loletha Carrow

## 2018-05-28 LAB — ALLERGEN FOOD PROFILE SPECIFIC IGE
Allergen Apple, IgE: <0.1 kU/L
Allergen Corn, IgE: <0.1 kU/L
Allergen Tomato, IgE: <0.1 kU/L
Chicken IgE: <0.1 kU/L
Codfish IgE: <0.1 kU/L
IgE (Immunoglobulin E), Serum: 30 IU/mL (ref 6–495)
Milk IgE: <0.1 kU/L
Orange: <0.1 kU/L
Peanut IgE: <0.1 kU/L
Shrimp IgE: <0.1 kU/L
Soybean IgE: <0.1 kU/L
Tuna: <0.1 kU/L
Wheat IgE: <0.1 kU/L

## 2018-06-16 ENCOUNTER — Ambulatory Visit (INDEPENDENT_AMBULATORY_CARE_PROVIDER_SITE_OTHER)
Admission: RE | Admit: 2018-06-16 | Discharge: 2018-06-16 | Disposition: A | Discharge status: Home / Self Care | Payer: Medicare HMO | Source: Ambulatory Visit | Attending: Internal Medicine | Admitting: Internal Medicine

## 2018-06-16 DIAGNOSIS — E785 Hyperlipidemia, unspecified: Secondary | ICD-10-CM

## 2018-06-19 ENCOUNTER — Ambulatory Visit: Payer: Medicare HMO | Admitting: Gastroenterology

## 2018-06-19 ENCOUNTER — Encounter: Payer: Self-pay | Admitting: Gastroenterology

## 2018-06-19 VITALS — BP 114/54 | HR 60 | Ht 67.0 in | Wt 165.0 lb

## 2018-06-19 DIAGNOSIS — R1011 Right upper quadrant pain: Secondary | ICD-10-CM

## 2018-06-19 DIAGNOSIS — R140 Abdominal distension (gaseous): Secondary | ICD-10-CM

## 2018-06-19 MED ORDER — DICYCLOMINE HCL 10 MG PO CAPS: 10.0000 mg | ORAL_CAPSULE | ORAL | 0 refills | Status: DC | PRN

## 2018-06-19 NOTE — Patient Instructions (Signed)
If you are age 80 or older, your body mass index should be between 23-30. Your Body mass index is 25.84 kg/m. If this is out of the aforementioned range listed, please consider follow up with your Primary Care Provider.  If you are age 48 or younger, your body mass index should be between 19-25. Your Body mass index is 25.84 kg/m. If this is out of the aformentioned range listed, please consider follow up with your Primary Care Provider.   Follow up as needed.   It was a pleasure to see you today!  Dr. Loletha Carrow

## 2018-06-19 NOTE — Progress Notes (Signed)
[facility] Progress Note  Chief Complaint: Right upper quadrant pain  Subjective  History:  Seen 08/2016 for bloating and altered bowel habits. Edwin Ramirez was sent by primary care for evaluation of right upper quadrant pain.  He says that to some extent it is similar to what was going on when he saw Dr. Olevia Perches years ago and also when he saw me for bloating in March 2018. He has intermittent or crampy right upper quadrant pain just under the rib cage that might also be felt in the back.  It is not brought on by certain movements, and occasionally seems more prominent if he eats in the evening, especially if it is a larger portion than usual.  He denies nausea vomiting or weight loss.  He denies dysphagia or frequent heartburn.  He started taking MiraLAX again when this pain recurred last year, but has not made any improvement.  Edwin Ramirez reports that he really has not been constipated. The pain is not worse with certain movements or inspiration.  Curiously, it almost always improves he puts heat on it.  If it occurs in the late evening, it will be gone by the morning.  ROS: Cardiovascular:  no chest pain Respiratory: no dyspnea Intermittent anxiety.  He wonders if this could be related to the pain since it started acting up as he was coming over here today.  The patient's Past Medical, Family and Social History were reviewed and are on file in the EMR. S/p cholecystectomy many years ago  Objective:  Med list reviewed  Current Outpatient Medications:  .  clotrimazole-betamethasone (LOTRISONE) cream, Apply 1 application topically 2 (two) times daily., Disp: 45 g, Rfl: 1 .  latanoprost (XALATAN) 0.005 % ophthalmic solution, Place 1 drop into both eyes at bedtime. , Disp: , Rfl:  .  LORazepam (ATIVAN) 1 MG tablet, Take 1 tablet (1 mg total) by mouth every 8 (eight) hours as needed for anxiety., Disp: 90 tablet, Rfl: 1 .  omeprazole (PRILOSEC) 40 MG capsule, Take 1 capsule (40 mg total) by mouth  2 (two) times daily., Disp: 180 capsule, Rfl: 3 .  polyethylene glycol powder (GLYCOLAX/MIRALAX) powder, Take 17 g by mouth daily. , Disp: , Rfl:  .  rosuvastatin (CRESTOR) 10 MG tablet, TAKE 1 TABLET BY MOUTH EVERY DAY, Disp: 90 tablet, Rfl: 3 .  Simethicone 125 MG CAPS, Take by mouth. Takes one capsule after meals and at bedtime, Disp: , Rfl:  .  traMADol (ULTRAM) 50 MG tablet, Take 1 tablet (50 mg total) by mouth every 6 (six) hours as needed for severe pain., Disp: 60 tablet, Rfl: 0 .  dicyclomine (BENTYL) 10 MG capsule, Take 1 capsule (10 mg total) by mouth as needed for spasms., Disp: 30 capsule, Rfl: 0   Vital signs in last 24 hrs: Vitals:   06/19/18 1558  BP: (!) 114/54  Pulse: 60    Physical Exam  He is well-appearing  HEENT: sclera anicteric, oral mucosa moist without lesions  Neck: supple, no thyromegaly, JVD or lymphadenopathy  Cardiac: RRR without murmurs, S1S2 heard, no peripheral edema  Pulm: clear to auscultation bilaterally, normal RR and effort noted  Abdomen: soft, no tenderness, with active bowel sounds. No guarding or palpable hepatosplenomegaly.  Skin; warm and dry, no jaundice or rash.  Recent Labs:  CBC Latest Ref Rng & Units 03/11/2018 07/08/2016 04/26/2016  WBC 4.0 - 10.5 K/uL 4.5 5.5 6.6  Hemoglobin 13.0 - 17.0 g/dL 14.6 14.9 14.1  Hematocrit 39.0 -  52.0 % 43.8 44.3 42.2  Platelets 150.0 - 400.0 K/uL 149.0(L) 162.0 176.0   CMP Latest Ref Rng & Units 05/26/2018 03/11/2018 11/20/2016  Glucose 70 - 99 mg/dL 105(H) 100(H) 100(H)  BUN 6 - 23 mg/dL 17 15 17   Creatinine 0.40 - 1.50 mg/dL 1.12 1.11 1.15  Sodium 135 - 145 mEq/L 138 141 140  Potassium 3.5 - 5.1 mEq/L 4.2 4.3 4.7  Chloride 96 - 112 mEq/L 105 105 106  CO2 19 - 32 mEq/L 27 29 29   Calcium 8.4 - 10.5 mg/dL 9.3 9.5 9.8  Total Protein 6.0 - 8.3 g/dL 7.0 6.8 7.0  Total Bilirubin 0.2 - 1.2 mg/dL 1.0 1.5(H) 1.1  Alkaline Phos 39 - 117 U/L 73 63 61  AST 0 - 37 U/L 20 21 22   ALT 0 - 53 U/L 19 16  19      Radiologic studies:  His only recent imaging is a coronary calcium CT  @ASSESSMENTPLANBEGIN @ Assessment: Encounter Diagnoses  Name Primary?  . RUQ pain Yes  . Bloating    This pain remains difficult to characterize.  Not clear if it is musculoskeletal or upper digestive.  It does not sound like radiated back pain.  If it is digestive, it does not sound obstructive, it is possibly functional/intestinal spasm.   Plan: Trial of low-dose dicyclomine Stop taking MiraLAX Apply heat as needed No further testing planned at present.   Total time 25 minutes, over half spent face-to-face with patient in counseling and coordination of care.   Edwin Ramirez

## 2018-06-24 ENCOUNTER — Other Ambulatory Visit: Payer: Self-pay | Admitting: Gastroenterology

## 2018-06-24 DIAGNOSIS — Z961 Presence of intraocular lens: Secondary | ICD-10-CM | POA: Diagnosis not present

## 2018-06-24 DIAGNOSIS — H532 Diplopia: Secondary | ICD-10-CM | POA: Diagnosis not present

## 2018-06-24 DIAGNOSIS — H5007 Alternating esotropia with V pattern: Secondary | ICD-10-CM | POA: Diagnosis not present

## 2018-06-24 DIAGNOSIS — H401131 Primary open-angle glaucoma, bilateral, mild stage: Secondary | ICD-10-CM | POA: Diagnosis not present

## 2018-06-24 DIAGNOSIS — R1011 Right upper quadrant pain: Secondary | ICD-10-CM

## 2018-06-24 DIAGNOSIS — H43813 Vitreous degeneration, bilateral: Secondary | ICD-10-CM | POA: Diagnosis not present

## 2018-06-24 MED ORDER — DICYCLOMINE HCL 10 MG PO CAPS: 10.0000 mg | ORAL_CAPSULE | Freq: Two times a day (BID) | ORAL | 1 refills | Status: DC | PRN

## 2018-06-28 ENCOUNTER — Other Ambulatory Visit: Payer: Self-pay | Admitting: Gastroenterology

## 2018-07-07 ENCOUNTER — Encounter: Payer: Self-pay | Admitting: Internal Medicine

## 2018-07-07 ENCOUNTER — Ambulatory Visit (INDEPENDENT_AMBULATORY_CARE_PROVIDER_SITE_OTHER): Payer: Medicare HMO | Admitting: Internal Medicine

## 2018-07-07 DIAGNOSIS — E785 Hyperlipidemia, unspecified: Secondary | ICD-10-CM

## 2018-07-07 DIAGNOSIS — I2583 Coronary atherosclerosis due to lipid rich plaque: Secondary | ICD-10-CM | POA: Diagnosis not present

## 2018-07-07 DIAGNOSIS — M5440 Lumbago with sciatica, unspecified side: Secondary | ICD-10-CM

## 2018-07-07 DIAGNOSIS — I2510 Atherosclerotic heart disease of native coronary artery without angina pectoris: Secondary | ICD-10-CM | POA: Diagnosis not present

## 2018-07-07 DIAGNOSIS — M5117 Intervertebral disc disorders with radiculopathy, lumbosacral region: Secondary | ICD-10-CM | POA: Diagnosis not present

## 2018-07-07 DIAGNOSIS — R1011 Right upper quadrant pain: Secondary | ICD-10-CM | POA: Diagnosis not present

## 2018-07-07 DIAGNOSIS — K219 Gastro-esophageal reflux disease without esophagitis: Secondary | ICD-10-CM

## 2018-07-07 MED ORDER — ASPIRIN EC 81 MG PO TBEC: 81.0000 mg | DELAYED_RELEASE_TABLET | Freq: Every day | ORAL | 3 refills | Status: DC

## 2018-07-07 NOTE — Assessment & Plan Note (Signed)
1/20 Coronary calcium score of 250. This was 58 percentile for age and sex matched control. Crestor Try EC baby ASA if tolerated Xarelto option discussed

## 2018-07-07 NOTE — Assessment & Plan Note (Signed)
Prilosec 

## 2018-07-07 NOTE — Assessment & Plan Note (Signed)
Try gentle yoga

## 2018-07-07 NOTE — Patient Instructions (Addendum)
Try gentle yoga Weighted blanket

## 2018-07-07 NOTE — Assessment & Plan Note (Signed)
1/20 Coronary calcium score of 250. This was 43 percentile for age and sex matched control. Crestor Try EC baby ASA if tolerated Xarelto option discussed

## 2018-07-07 NOTE — Assessment & Plan Note (Signed)
Resolved w/Miralax

## 2018-07-07 NOTE — Progress Notes (Signed)
Subjective:  Patient ID: Edwin Ramirez, male    DOB: 1940-07-19  Age: 78 y.o. MRN: [id number]  CC: No chief complaint on file.   HPI CHRISTY FRIEDE presents for epigastric pain resolved on Miralax and gas-ex. He saw Dr Loletha Carrow (GI). F/u dyslipidemia     Outpatient Medications Prior to Visit  Medication Sig Dispense Refill  . clotrimazole-betamethasone (LOTRISONE) cream Apply 1 application topically 2 (two) times daily. 45 g 1  . dicyclomine (BENTYL) 10 MG capsule TAKE 1 CAPSULE BY MOUTH AS NEEDED FOR SPASMS 30 capsule 0  . latanoprost (XALATAN) 0.005 % ophthalmic solution Place 1 drop into both eyes at bedtime.     Marland Kitchen LORazepam (ATIVAN) 1 MG tablet Take 1 tablet (1 mg total) by mouth every 8 (eight) hours as needed for anxiety. 90 tablet 1  . omeprazole (PRILOSEC) 40 MG capsule Take 1 capsule (40 mg total) by mouth 2 (two) times daily. 180 capsule 3  . polyethylene glycol powder (GLYCOLAX/MIRALAX) powder Take 17 g by mouth daily.     . rosuvastatin (CRESTOR) 10 MG tablet TAKE 1 TABLET BY MOUTH EVERY DAY 90 tablet 3  . Simethicone 125 MG CAPS Take by mouth. Takes one capsule after meals and at bedtime    . traMADol (ULTRAM) 50 MG tablet Take 1 tablet (50 mg total) by mouth every 6 (six) hours as needed for severe pain. 60 tablet 0   No facility-administered medications prior to visit.     ROS: Review of Systems  Constitutional: Negative for appetite change, fatigue and unexpected weight change.  HENT: Negative for congestion, nosebleeds, sneezing, sore throat and trouble swallowing.   Eyes: Negative for itching and visual disturbance.  Respiratory: Negative for cough.   Cardiovascular: Negative for chest pain, palpitations and leg swelling.  Gastrointestinal: Negative for abdominal distention, blood in stool, diarrhea and nausea.  Genitourinary: Negative for frequency and hematuria.  Musculoskeletal: Negative for back pain, gait problem, joint swelling and neck pain.    Skin: Negative for rash.  Neurological: Negative for dizziness, tremors, speech difficulty and weakness.  Psychiatric/Behavioral: Negative for agitation, dysphoric mood and sleep disturbance. The patient is not nervous/anxious.     Objective:  BP 116/62 (BP Location: Left Arm, Patient Position: Sitting, Cuff Size: Normal)   Pulse (!) 57   Temp 98 F (36.7 C) (Oral)   Ht 5\' 7"  (1.702 m)   Wt 162 lb (73.5 kg)   SpO2 97%   BMI 25.37 kg/m   BP Readings from Last 3 Encounters:  07/07/18 116/62  06/19/18 (!) 114/54  05/26/18 124/62    Wt Readings from Last 3 Encounters:  07/07/18 162 lb (73.5 kg)  06/19/18 165 lb (74.8 kg)  05/26/18 164 lb (74.4 kg)    Physical Exam Constitutional:      General: He is not in acute distress.    Appearance: He is well-developed.     Comments: NAD  Eyes:     Conjunctiva/sclera: Conjunctivae normal.     Pupils: Pupils are equal, round, and reactive to light.  Neck:     Musculoskeletal: Normal range of motion.     Thyroid: No thyromegaly.     Vascular: No JVD.  Cardiovascular:     Rate and Rhythm: Normal rate and regular rhythm.     Heart sounds: Normal heart sounds. No murmur. No friction rub. No gallop.   Pulmonary:     Effort: Pulmonary effort is normal. No respiratory distress.     Breath sounds:  Normal breath sounds. No wheezing or rales.  Chest:     Chest wall: No tenderness.  Abdominal:     General: Bowel sounds are normal. There is no distension.     Palpations: Abdomen is soft. There is no mass.     Tenderness: There is no abdominal tenderness. There is no guarding or rebound.  Musculoskeletal: Normal range of motion.        General: No tenderness.  Lymphadenopathy:     Cervical: No cervical adenopathy.  Skin:    General: Skin is warm and dry.     Findings: No rash.  Neurological:     Mental Status: He is alert and oriented to person, place, and time.     Cranial Nerves: No cranial nerve deficit.     Motor: No abnormal  muscle tone.     Coordination: Coordination normal.     Gait: Gait normal.     Deep Tendon Reflexes: Reflexes are normal and symmetric.  Psychiatric:        Behavior: Behavior normal.        Thought Content: Thought content normal.        Judgment: Judgment normal.   LS tender  Lab Results  Component Value Date   WBC 4.5 03/11/2018   HGB 14.6 03/11/2018   HCT 43.8 03/11/2018   PLT 149.0 (L) 03/11/2018   GLUCOSE 105 (H) 05/26/2018   CHOL 156 03/11/2018   TRIG 183.0 (H) 03/11/2018   HDL 33.70 (L) 03/11/2018   LDLDIRECT 78.8 02/05/2011   LDLCALC 85 03/11/2018   ALT 19 05/26/2018   AST 20 05/26/2018   NA 138 05/26/2018   K 4.2 05/26/2018   CL 105 05/26/2018   CREATININE 1.12 05/26/2018   BUN 17 05/26/2018   CO2 27 05/26/2018   TSH 1.78 03/11/2018   PSA 0.00 (L) 03/11/2018   HGBA1C 5.7 03/07/2014    Ct Cardiac Scoring  Addendum Date: 06/16/2018   ADDENDUM REPORT: 06/16/2018 12:03 CLINICAL DATA:  Risk stratification EXAM: Coronary Calcium Score TECHNIQUE: The patient was scanned on a [employer] scanner. Axial non-contrast 3 mm slices were carried out through the heart. The data set was analyzed on a dedicated work station and scored using the [place]. FINDINGS: Non-cardiac: See separate report from [facility]. Ascending Aorta: Normal size, mild diffuse calcifications in the aortic root. Pericardium: Normal. Coronary arteries: Normal origin. IMPRESSION: Coronary calcium score of 250. This was 88 percentile for age and sex matched control. Electronically Signed   By: Ena Dawley   On: 06/16/2018 12:03   Result Date: 06/16/2018 EXAM: OVER-READ INTERPRETATION  CT CHEST The following report is an over-read performed by radiologist Dr. Rolm Baptise of [facility], [place] on 06/16/2018. This over-read does not include interpretation of cardiac or coronary anatomy or pathology. The coronary calcium score interpretation by the cardiologist is attached. COMPARISON:   None. FINDINGS: Vascular: Heart is upper limits normal in size. Aorta normal caliber. Mediastinum/Nodes: No adenopathy in the lower mediastinum or hila. Lungs/Pleura: Lingular subpleural nodule measures 5 mm on image 15. Remainder of the visualized lungs clear. No effusions. Upper Abdomen: Imaging into the upper abdomen shows no acute findings. Musculoskeletal: Chest wall soft tissues are unremarkable. No acute bony abnormality. IMPRESSION: 5 mm lingular nodule. No follow-up needed if patient is low-risk. Non-contrast chest CT can be considered in 12 months if patient is high-risk. This recommendation follows the consensus statement: Guidelines for Management of Incidental Pulmonary Nodules Detected on CT Images: From the  Fleischner Society 2017; Radiology 2017; [id number]. Electronically Signed: By: Rolm Baptise M.D. On: 06/16/2018 11:52    Assessment & Plan:   There are no diagnoses linked to this encounter.   No orders of the defined types were placed in this encounter.    Follow-up: No follow-ups on file.  Walker Kehr, MD

## 2018-07-09 DIAGNOSIS — Z85828 Personal history of other malignant neoplasm of skin: Secondary | ICD-10-CM | POA: Diagnosis not present

## 2018-07-09 DIAGNOSIS — L570 Actinic keratosis: Secondary | ICD-10-CM | POA: Diagnosis not present

## 2018-07-09 DIAGNOSIS — D1801 Hemangioma of skin and subcutaneous tissue: Secondary | ICD-10-CM | POA: Diagnosis not present

## 2018-07-09 DIAGNOSIS — L821 Other seborrheic keratosis: Secondary | ICD-10-CM | POA: Diagnosis not present

## 2018-08-26 ENCOUNTER — Telehealth: Payer: Self-pay | Admitting: Gastroenterology

## 2018-08-26 MED ORDER — DICYCLOMINE HCL 10 MG PO CAPS: ORAL_CAPSULE | ORAL | 1 refills | Status: DC

## 2018-08-26 NOTE — Telephone Encounter (Signed)
Refilled

## 2018-08-26 NOTE — Telephone Encounter (Signed)
Spoke with pt he is needing a refill of medication

## 2018-10-08 ENCOUNTER — Encounter: Payer: Self-pay | Admitting: Internal Medicine

## 2018-10-08 ENCOUNTER — Ambulatory Visit (INDEPENDENT_AMBULATORY_CARE_PROVIDER_SITE_OTHER): Payer: Medicare HMO | Admitting: Internal Medicine

## 2018-10-08 ENCOUNTER — Ambulatory Visit: Payer: Medicare HMO | Admitting: Internal Medicine

## 2018-10-08 DIAGNOSIS — K219 Gastro-esophageal reflux disease without esophagitis: Secondary | ICD-10-CM

## 2018-10-08 DIAGNOSIS — R1011 Right upper quadrant pain: Secondary | ICD-10-CM | POA: Diagnosis not present

## 2018-10-08 DIAGNOSIS — I2583 Coronary atherosclerosis due to lipid rich plaque: Secondary | ICD-10-CM

## 2018-10-08 DIAGNOSIS — I2510 Atherosclerotic heart disease of native coronary artery without angina pectoris: Secondary | ICD-10-CM

## 2018-10-08 MED ORDER — LORAZEPAM 1 MG PO TABS: 1.0000 mg | ORAL_TABLET | Freq: Three times a day (TID) | ORAL | 1 refills | Status: DC | PRN

## 2018-10-08 MED ORDER — ROSUVASTATIN CALCIUM 10 MG PO TABS: 10.0000 mg | ORAL_TABLET | Freq: Every day | ORAL | 3 refills | Status: DC

## 2018-10-08 MED ORDER — DICYCLOMINE HCL 10 MG PO CAPS: ORAL_CAPSULE | ORAL | 3 refills | Status: DC

## 2018-10-08 NOTE — Progress Notes (Signed)
Virtual Visit via Video Note  I connected with Edwin Ramirez on 10/08/18 at 10:00 AM EDT by a video enabled telemedicine application and verified that I am speaking with the correct person using two identifiers.   I discussed the limitations of evaluation and management by telemedicine and the availability of in person appointments. The patient expressed understanding and agreed to proceed.  History of Present Illness: F/u RUQ abd pain and IBS - better, CAD, GERD, LBP  There is no chest pain, nausea, vomiting.  Edwin Ramirez saw Dr. Loletha Carrow and was prescribed dicyclomine that he takes on occasion. No weight loss, no diarrhea Observations/Objective:  NAD Assessment and Plan:  See plan Follow Up Instructions:    I discussed the assessment and treatment plan with the patient. The patient was provided an opportunity to ask questions and all were answered. The patient agreed with the plan and demonstrated an understanding of the instructions.   The patient was advised to call back or seek an in-person evaluation if the symptoms worsen or if the condition fails to improve as anticipated.  I provided 25 minutes of non-face-to-face time during this encounter.   Walker Kehr, MD

## 2018-10-08 NOTE — Assessment & Plan Note (Signed)
The pain is likely related to IBS.  Edwin Ramirez saw Dr. Loletha Carrow and was prescribed dicyclomine that he takes on occasion.

## 2018-10-08 NOTE — Assessment & Plan Note (Signed)
Continue with Prilosec

## 2018-10-08 NOTE — Assessment & Plan Note (Signed)
Doing well 

## 2018-10-08 NOTE — Assessment & Plan Note (Signed)
The pain is likely related to IBS.  [name] saw Dr. Loletha Carrow and was prescribed dicyclomine that he takes on occasion.

## 2018-12-30 DIAGNOSIS — H43813 Vitreous degeneration, bilateral: Secondary | ICD-10-CM | POA: Diagnosis not present

## 2018-12-30 DIAGNOSIS — H401131 Primary open-angle glaucoma, bilateral, mild stage: Secondary | ICD-10-CM | POA: Diagnosis not present

## 2018-12-30 DIAGNOSIS — H35373 Puckering of macula, bilateral: Secondary | ICD-10-CM | POA: Diagnosis not present

## 2018-12-30 DIAGNOSIS — Z961 Presence of intraocular lens: Secondary | ICD-10-CM | POA: Diagnosis not present

## 2019-01-12 ENCOUNTER — Encounter: Payer: Self-pay | Admitting: Internal Medicine

## 2019-01-12 ENCOUNTER — Ambulatory Visit (INDEPENDENT_AMBULATORY_CARE_PROVIDER_SITE_OTHER): Payer: Medicare HMO | Admitting: Internal Medicine

## 2019-01-12 ENCOUNTER — Other Ambulatory Visit: Payer: Self-pay

## 2019-01-12 DIAGNOSIS — J0100 Acute maxillary sinusitis, unspecified: Secondary | ICD-10-CM

## 2019-01-12 DIAGNOSIS — H9202 Otalgia, left ear: Secondary | ICD-10-CM | POA: Insufficient documentation

## 2019-01-12 MED ORDER — CEFUROXIME AXETIL 250 MG PO TABS: 250.0000 mg | ORAL_TABLET | Freq: Two times a day (BID) | ORAL | 0 refills | Status: DC

## 2019-01-12 NOTE — Assessment & Plan Note (Addendum)
Probable sinusitis vs other. Left - Empiric Ceftin po Probiotic

## 2019-01-12 NOTE — Progress Notes (Signed)
Subjective:  Patient ID: Edwin Ramirez, male    DOB: February 23, 1941  Age: 78 y.o. MRN: [id number]  CC: Tinnitus (Left ear x >1 week again) and Dental Pain (left side)   HPI Edwin Ramirez presents for L ear discomfort, pain >1 week It started after swimming in a pool. L cheek hurts...  Outpatient Medications Prior to Visit  Medication Sig Dispense Refill   aspirin EC 81 MG tablet Take 1 tablet (81 mg total) by mouth daily. If tolerated 100 tablet 3   clotrimazole-betamethasone (LOTRISONE) cream Apply 1 application topically 2 (two) times daily. 45 g 1   dicyclomine (BENTYL) 10 MG capsule Take 1 a day as needed for spasms 90 capsule 3   latanoprost (XALATAN) 0.005 % ophthalmic solution Place 1 drop into both eyes at bedtime.      LORazepam (ATIVAN) 1 MG tablet Take 1 tablet (1 mg total) by mouth every 8 (eight) hours as needed for anxiety. 90 tablet 1   omeprazole (PRILOSEC) 40 MG capsule Take 1 capsule (40 mg total) by mouth 2 (two) times daily. 180 capsule 3   polyethylene glycol powder (GLYCOLAX/MIRALAX) powder Take 17 g by mouth daily.      rosuvastatin (CRESTOR) 10 MG tablet Take 1 tablet (10 mg total) by mouth daily. 90 tablet 3   Simethicone 125 MG CAPS Take by mouth. Takes one capsule after meals and at bedtime     traMADol (ULTRAM) 50 MG tablet Take 1 tablet (50 mg total) by mouth every 6 (six) hours as needed for severe pain. 60 tablet 0   No facility-administered medications prior to visit.     ROS: Review of Systems  Constitutional: Negative for appetite change, fatigue and unexpected weight change.  HENT: Positive for ear pain, sinus pressure and sinus pain. Negative for congestion, hearing loss, nosebleeds, postnasal drip, rhinorrhea, sneezing, sore throat, trouble swallowing and voice change.   Eyes: Negative for itching and visual disturbance.  Respiratory: Negative for cough.   Cardiovascular: Negative for chest pain, palpitations and leg swelling.    Gastrointestinal: Negative for abdominal distention, blood in stool, diarrhea and nausea.  Genitourinary: Negative for frequency and hematuria.  Musculoskeletal: Negative for back pain, gait problem, joint swelling and neck pain.  Skin: Negative for rash.  Neurological: Negative for dizziness, tremors, speech difficulty and weakness.  Psychiatric/Behavioral: Negative for agitation, dysphoric mood and sleep disturbance. The patient is not nervous/anxious.     Objective:  BP 120/70 (BP Location: Left Arm, Patient Position: Sitting, Cuff Size: Normal)    Pulse 95    Temp (!) 97.4 F (36.3 C) (Oral)    Ht 5\' 7"  (1.702 m)    Wt 161 lb (73 kg)    SpO2 98%    BMI 25.22 kg/m   BP Readings from Last 3 Encounters:  01/12/19 120/70  07/07/18 116/62  06/19/18 (!) 114/54    Wt Readings from Last 3 Encounters:  01/12/19 161 lb (73 kg)  07/07/18 162 lb (73.5 kg)  06/19/18 165 lb (74.8 kg)    Physical Exam Constitutional:      General: He is not in acute distress.    Appearance: He is well-developed.     Comments: NAD  HENT:     Head: Atraumatic.     Right Ear: Tympanic membrane, ear canal and external ear normal. There is no impacted cerumen.     Left Ear: Tympanic membrane, ear canal and external ear normal. There is no impacted cerumen.  Eyes:  Conjunctiva/sclera: Conjunctivae normal.     Pupils: Pupils are equal, round, and reactive to light.  Neck:     Musculoskeletal: Normal range of motion.     Thyroid: No thyromegaly.     Vascular: No JVD.  Cardiovascular:     Rate and Rhythm: Normal rate and regular rhythm.     Heart sounds: Normal heart sounds. No murmur. No friction rub. No gallop.   Pulmonary:     Effort: Pulmonary effort is normal. No respiratory distress.     Breath sounds: Normal breath sounds. No wheezing or rales.  Chest:     Chest wall: No tenderness.  Abdominal:     General: Bowel sounds are normal. There is no distension.     Palpations: Abdomen is soft.  There is no mass.     Tenderness: There is no abdominal tenderness. There is no guarding or rebound.  Musculoskeletal: Normal range of motion.        General: No tenderness.  Lymphadenopathy:     Cervical: No cervical adenopathy.  Skin:    General: Skin is warm and dry.     Findings: No rash.  Neurological:     Mental Status: He is alert and oriented to person, place, and time.     Cranial Nerves: No cranial nerve deficit.     Motor: No abnormal muscle tone.     Coordination: Coordination normal.     Gait: Gait normal.     Deep Tendon Reflexes: Reflexes are normal and symmetric.  Psychiatric:        Behavior: Behavior normal.        Thought Content: Thought content normal.        Judgment: Judgment normal.   no rash  Lab Results  Component Value Date   WBC 4.5 03/11/2018   HGB 14.6 03/11/2018   HCT 43.8 03/11/2018   PLT 149.0 (L) 03/11/2018   GLUCOSE 105 (H) 05/26/2018   CHOL 156 03/11/2018   TRIG 183.0 (H) 03/11/2018   HDL 33.70 (L) 03/11/2018   LDLDIRECT 78.8 02/05/2011   LDLCALC 85 03/11/2018   ALT 19 05/26/2018   AST 20 05/26/2018   NA 138 05/26/2018   K 4.2 05/26/2018   CL 105 05/26/2018   CREATININE 1.12 05/26/2018   BUN 17 05/26/2018   CO2 27 05/26/2018   TSH 1.78 03/11/2018   PSA 0.00 (L) 03/11/2018   HGBA1C 5.7 03/07/2014    Ct Cardiac Scoring  Addendum Date: 06/16/2018   ADDENDUM REPORT: 06/16/2018 12:03 CLINICAL DATA:  Risk stratification EXAM: Coronary Calcium Score TECHNIQUE: The patient was scanned on a [employer] scanner. Axial non-contrast 3 mm slices were carried out through the heart. The data set was analyzed on a dedicated work station and scored using the [place]. FINDINGS: Non-cardiac: See separate report from [facility]. Ascending Aorta: Normal size, mild diffuse calcifications in the aortic root. Pericardium: Normal. Coronary arteries: Normal origin. IMPRESSION: Coronary calcium score of 250. This was 90 percentile for age  and sex matched control. Electronically Signed   By: Ena Dawley   On: 06/16/2018 12:03   Result Date: 06/16/2018 EXAM: OVER-READ INTERPRETATION  CT CHEST The following report is an over-read performed by radiologist Dr. Rolm Baptise of [facility], [place] on 06/16/2018. This over-read does not include interpretation of cardiac or coronary anatomy or pathology. The coronary calcium score interpretation by the cardiologist is attached. COMPARISON:  None. FINDINGS: Vascular: Heart is upper limits normal in size. Aorta normal caliber. Mediastinum/Nodes:  No adenopathy in the lower mediastinum or hila. Lungs/Pleura: Lingular subpleural nodule measures 5 mm on image 15. Remainder of the visualized lungs clear. No effusions. Upper Abdomen: Imaging into the upper abdomen shows no acute findings. Musculoskeletal: Chest wall soft tissues are unremarkable. No acute bony abnormality. IMPRESSION: 5 mm lingular nodule. No follow-up needed if patient is low-risk. Non-contrast chest CT can be considered in 12 months if patient is high-risk. This recommendation follows the consensus statement: Guidelines for Management of Incidental Pulmonary Nodules Detected on CT Images: From the Fleischner Society 2017; Radiology 2017; 284:228-243. Electronically Signed: By: Rolm Baptise M.D. On: 06/16/2018 11:52    Assessment & Plan:   There are no diagnoses linked to this encounter.   No orders of the defined types were placed in this encounter.    Follow-up: No follow-ups on file.  Walker Kehr, MD

## 2019-01-12 NOTE — Assessment & Plan Note (Addendum)
Probable sinusitis vs other. Left - Empiric Ceftin po Probiotic Try Advil for ?trigeminal neuralgia

## 2019-01-12 NOTE — Patient Instructions (Signed)
Try Advil for ?trigeminal neuralgia

## 2019-01-25 ENCOUNTER — Encounter: Payer: Self-pay | Admitting: Internal Medicine

## 2019-01-25 ENCOUNTER — Ambulatory Visit (INDEPENDENT_AMBULATORY_CARE_PROVIDER_SITE_OTHER): Payer: Medicare HMO | Admitting: Internal Medicine

## 2019-01-25 ENCOUNTER — Other Ambulatory Visit: Payer: Self-pay

## 2019-01-25 DIAGNOSIS — H9202 Otalgia, left ear: Secondary | ICD-10-CM | POA: Diagnosis not present

## 2019-01-25 NOTE — Progress Notes (Signed)
Subjective:  Patient ID: Edwin Ramirez, male    DOB: 28-Jul-1940  Age: 78 y.o. MRN: [id number]  CC: No chief complaint on file.   HPI Edwin Ramirez presents for persistent L earache - not better w/abx Feels like water in the ear   Outpatient Medications Prior to Visit  Medication Sig Dispense Refill   aspirin EC 81 MG tablet Take 1 tablet (81 mg total) by mouth daily. If tolerated 100 tablet 3   clotrimazole-betamethasone (LOTRISONE) cream Apply 1 application topically 2 (two) times daily. 45 g 1   dicyclomine (BENTYL) 10 MG capsule Take 1 a day as needed for spasms 90 capsule 3   latanoprost (XALATAN) 0.005 % ophthalmic solution Place 1 drop into both eyes at bedtime.      LORazepam (ATIVAN) 1 MG tablet Take 1 tablet (1 mg total) by mouth every 8 (eight) hours as needed for anxiety. 90 tablet 1   omeprazole (PRILOSEC) 40 MG capsule Take 1 capsule (40 mg total) by mouth 2 (two) times daily. 180 capsule 3   polyethylene glycol powder (GLYCOLAX/MIRALAX) powder Take 17 g by mouth daily.      rosuvastatin (CRESTOR) 10 MG tablet Take 1 tablet (10 mg total) by mouth daily. 90 tablet 3   Simethicone 125 MG CAPS Take by mouth. Takes one capsule after meals and at bedtime     traMADol (ULTRAM) 50 MG tablet Take 1 tablet (50 mg total) by mouth every 6 (six) hours as needed for severe pain. 60 tablet 0   cefUROXime (CEFTIN) 250 MG tablet Take 1 tablet (250 mg total) by mouth 2 (two) times daily. (Patient not taking: Reported on 01/25/2019) 20 tablet 0   No facility-administered medications prior to visit.     ROS: Review of Systems  Constitutional: Negative for appetite change, fatigue and unexpected weight change.  HENT: Positive for ear pain. Negative for congestion, nosebleeds, sneezing, sore throat and trouble swallowing.   Eyes: Negative for itching and visual disturbance.  Respiratory: Negative for cough.   Cardiovascular: Negative for chest pain, palpitations and  leg swelling.  Gastrointestinal: Negative for abdominal distention, blood in stool, diarrhea and nausea.  Genitourinary: Negative for frequency and hematuria.  Musculoskeletal: Negative for back pain, gait problem, joint swelling and neck pain.  Skin: Negative for rash.  Neurological: Negative for dizziness, tremors, speech difficulty and weakness.  Psychiatric/Behavioral: Negative for agitation, dysphoric mood and sleep disturbance. The patient is not nervous/anxious.     Objective:  BP 124/68 (BP Location: Left Arm, Patient Position: Sitting, Cuff Size: Normal)    Pulse 77    Temp (!) 97.5 F (36.4 C) (Oral)    Ht 5\' 7"  (1.702 m)    Wt 164 lb (74.4 kg)    SpO2 95%    BMI 25.69 kg/m   BP Readings from Last 3 Encounters:  01/25/19 124/68  01/12/19 120/70  07/07/18 116/62    Wt Readings from Last 3 Encounters:  01/25/19 164 lb (74.4 kg)  01/12/19 161 lb (73 kg)  07/07/18 162 lb (73.5 kg)    Physical Exam Constitutional:      General: He is not in acute distress.    Appearance: He is well-developed.     Comments: NAD  HENT:     Head: Atraumatic.     Right Ear: Tympanic membrane, ear canal and external ear normal. There is no impacted cerumen.     Left Ear: Tympanic membrane, ear canal and external ear normal. There is  no impacted cerumen.     Nose: No congestion or rhinorrhea.  Eyes:     Conjunctiva/sclera: Conjunctivae normal.     Pupils: Pupils are equal, round, and reactive to light.  Neck:     Musculoskeletal: Normal range of motion.     Thyroid: No thyromegaly.     Vascular: No JVD.  Cardiovascular:     Rate and Rhythm: Normal rate and regular rhythm.     Heart sounds: Normal heart sounds. No murmur. No friction rub. No gallop.   Pulmonary:     Effort: Pulmonary effort is normal. No respiratory distress.     Breath sounds: Normal breath sounds. No wheezing or rales.  Chest:     Chest wall: No tenderness.  Abdominal:     General: Bowel sounds are normal. There is  no distension.     Palpations: Abdomen is soft. There is no mass.     Tenderness: There is no abdominal tenderness. There is no guarding or rebound.  Musculoskeletal: Normal range of motion.        General: No tenderness.  Lymphadenopathy:     Cervical: No cervical adenopathy.  Skin:    General: Skin is warm and dry.     Findings: No rash.  Neurological:     Mental Status: He is alert and oriented to person, place, and time.     Cranial Nerves: No cranial nerve deficit.     Motor: No abnormal muscle tone.     Coordination: Coordination normal.     Gait: Gait normal.     Deep Tendon Reflexes: Reflexes are normal and symmetric.  Psychiatric:        Behavior: Behavior normal.        Thought Content: Thought content normal.        Judgment: Judgment normal.    Hearing aids B TMJ NT B  Lab Results  Component Value Date   WBC 4.5 03/11/2018   HGB 14.6 03/11/2018   HCT 43.8 03/11/2018   PLT 149.0 (L) 03/11/2018   GLUCOSE 105 (H) 05/26/2018   CHOL 156 03/11/2018   TRIG 183.0 (H) 03/11/2018   HDL 33.70 (L) 03/11/2018   LDLDIRECT 78.8 02/05/2011   LDLCALC 85 03/11/2018   ALT 19 05/26/2018   AST 20 05/26/2018   NA 138 05/26/2018   K 4.2 05/26/2018   CL 105 05/26/2018   CREATININE 1.12 05/26/2018   BUN 17 05/26/2018   CO2 27 05/26/2018   TSH 1.78 03/11/2018   PSA 0.00 (L) 03/11/2018   HGBA1C 5.7 03/07/2014    Ct Cardiac Scoring  Addendum Date: 06/16/2018   ADDENDUM REPORT: 06/16/2018 12:03 CLINICAL DATA:  Risk stratification EXAM: Coronary Calcium Score TECHNIQUE: The patient was scanned on a [employer] scanner. Axial non-contrast 3 mm slices were carried out through the heart. The data set was analyzed on a dedicated work station and scored using the [place]. FINDINGS: Non-cardiac: See separate report from [facility]. Ascending Aorta: Normal size, mild diffuse calcifications in the aortic root. Pericardium: Normal. Coronary arteries: Normal origin.  IMPRESSION: Coronary calcium score of 250. This was 65 percentile for age and sex matched control. Electronically Signed   By: Ena Dawley   On: 06/16/2018 12:03   Result Date: 06/16/2018 EXAM: OVER-READ INTERPRETATION  CT CHEST The following report is an over-read performed by radiologist Dr. Rolm Baptise of [facility], [place] on 06/16/2018. This over-read does not include interpretation of cardiac or coronary anatomy or pathology. The coronary calcium  score interpretation by the cardiologist is attached. COMPARISON:  None. FINDINGS: Vascular: Heart is upper limits normal in size. Aorta normal caliber. Mediastinum/Nodes: No adenopathy in the lower mediastinum or hila. Lungs/Pleura: Lingular subpleural nodule measures 5 mm on image 15. Remainder of the visualized lungs clear. No effusions. Upper Abdomen: Imaging into the upper abdomen shows no acute findings. Musculoskeletal: Chest wall soft tissues are unremarkable. No acute bony abnormality. IMPRESSION: 5 mm lingular nodule. No follow-up needed if patient is low-risk. Non-contrast chest CT can be considered in 12 months if patient is high-risk. This recommendation follows the consensus statement: Guidelines for Management of Incidental Pulmonary Nodules Detected on CT Images: From the Fleischner Society 2017; Radiology 2017; 284:228-243. Electronically Signed: By: Rolm Baptise M.D. On: 06/16/2018 11:52    Assessment & Plan:   There are no diagnoses linked to this encounter.   No orders of the defined types were placed in this encounter.    Follow-up: No follow-ups on file.  Walker Kehr, MD

## 2019-01-25 NOTE — Assessment & Plan Note (Addendum)
Not better w/abx ENT ref Consider steroids vs other

## 2019-02-09 DIAGNOSIS — H9209 Otalgia, unspecified ear: Secondary | ICD-10-CM | POA: Diagnosis not present

## 2019-02-09 DIAGNOSIS — J3489 Other specified disorders of nose and nasal sinuses: Secondary | ICD-10-CM | POA: Insufficient documentation

## 2019-02-10 DIAGNOSIS — J3489 Other specified disorders of nose and nasal sinuses: Secondary | ICD-10-CM | POA: Diagnosis not present

## 2019-02-10 DIAGNOSIS — H9209 Otalgia, unspecified ear: Secondary | ICD-10-CM | POA: Diagnosis not present

## 2019-03-09 ENCOUNTER — Ambulatory Visit (INDEPENDENT_AMBULATORY_CARE_PROVIDER_SITE_OTHER): Payer: Medicare HMO | Admitting: Internal Medicine

## 2019-03-09 ENCOUNTER — Other Ambulatory Visit: Payer: Self-pay

## 2019-03-09 ENCOUNTER — Encounter: Payer: Self-pay | Admitting: Internal Medicine

## 2019-03-09 DIAGNOSIS — G4700 Insomnia, unspecified: Secondary | ICD-10-CM

## 2019-03-09 DIAGNOSIS — M5481 Occipital neuralgia: Secondary | ICD-10-CM | POA: Diagnosis not present

## 2019-03-09 MED ORDER — METHYLPREDNISOLONE 4 MG PO TBPK: ORAL_TABLET | ORAL | 0 refills | Status: DC

## 2019-03-09 MED ORDER — B COMPLEX PO TABS: 1.0000 | ORAL_TABLET | Freq: Every day | ORAL | 3 refills | Status: AC

## 2019-03-09 MED ORDER — TRAZODONE HCL 50 MG PO TABS: 50.0000 mg | ORAL_TABLET | Freq: Every evening | ORAL | 5 refills | Status: DC | PRN

## 2019-03-09 MED ORDER — VITAMIN D3 50 MCG (2000 UT) PO CAPS: 2000.0000 [IU] | ORAL_CAPSULE | Freq: Every day | ORAL | 3 refills | Status: AC

## 2019-03-09 NOTE — Assessment & Plan Note (Signed)
Trazodone prn 

## 2019-03-09 NOTE — Progress Notes (Signed)
Subjective:  Patient ID: Edwin Ramirez, male    DOB: [birth year]  Age: 78 y.o. MRN: [id number]  CC: No chief complaint on file.   HPI Edwin Ramirez presents for HAs, cervical pain - not better F/u insomnia - worse Dr Janeice Robinson note reviewed   Outpatient Medications Prior to Visit  Medication Sig Dispense Refill  . aspirin EC 81 MG tablet Take 1 tablet (81 mg total) by mouth daily. If tolerated 100 tablet 3  . clotrimazole-betamethasone (LOTRISONE) cream Apply 1 application topically 2 (two) times daily. 45 g 1  . dicyclomine (BENTYL) 10 MG capsule Take 1 a day as needed for spasms 90 capsule 3  . latanoprost (XALATAN) 0.005 % ophthalmic solution Place 1 drop into both eyes at bedtime.     Marland Kitchen LORazepam (ATIVAN) 1 MG tablet Take 1 tablet (1 mg total) by mouth every 8 (eight) hours as needed for anxiety. 90 tablet 1  . omeprazole (PRILOSEC) 40 MG capsule Take 1 capsule (40 mg total) by mouth 2 (two) times daily. 180 capsule 3  . polyethylene glycol powder (GLYCOLAX/MIRALAX) powder Take 17 g by mouth daily.     . rosuvastatin (CRESTOR) 10 MG tablet Take 1 tablet (10 mg total) by mouth daily. 90 tablet 3  . Simethicone 125 MG CAPS Take by mouth. Takes one capsule after meals and at bedtime    . traMADol (ULTRAM) 50 MG tablet Take 1 tablet (50 mg total) by mouth every 6 (six) hours as needed for severe pain. 60 tablet 0   No facility-administered medications prior to visit.     ROS: Review of Systems  Constitutional: Negative for appetite change, fatigue and unexpected weight change.  HENT: Negative for congestion, nosebleeds, sneezing, sore throat and trouble swallowing.   Eyes: Negative for itching and visual disturbance.  Respiratory: Negative for cough.   Cardiovascular: Negative for chest pain, palpitations and leg swelling.  Gastrointestinal: Negative for abdominal distention, blood in stool, diarrhea and nausea.  Genitourinary: Negative for frequency and hematuria.   Musculoskeletal: Negative for back pain, gait problem, joint swelling and neck pain.  Skin: Negative for rash.  Neurological: Positive for headaches. Negative for dizziness, tremors, speech difficulty and weakness.  Psychiatric/Behavioral: Positive for sleep disturbance. Negative for agitation, dysphoric mood and suicidal ideas. The patient is not nervous/anxious.     Objective:  BP (!) 116/58 (BP Location: Left Arm, Patient Position: Sitting, Cuff Size: Normal)   Pulse 72   Temp 98 F (36.7 C) (Oral)   Ht 5\' 7"  (1.702 m)   Wt 161 lb (73 kg)   SpO2 97%   BMI 25.22 kg/m   BP Readings from Last 3 Encounters:  03/09/19 (!) 116/58  01/25/19 124/68  01/12/19 120/70    Wt Readings from Last 3 Encounters:  03/09/19 161 lb (73 kg)  01/25/19 164 lb (74.4 kg)  01/12/19 161 lb (73 kg)    Physical Exam Constitutional:      General: He is not in acute distress.    Appearance: He is well-developed.     Comments: NAD  Eyes:     Conjunctiva/sclera: Conjunctivae normal.     Pupils: Pupils are equal, round, and reactive to light.  Neck:     Musculoskeletal: Normal range of motion.     Thyroid: No thyromegaly.     Vascular: No JVD.  Cardiovascular:     Rate and Rhythm: Normal rate and regular rhythm.     Heart sounds: Normal heart sounds. No murmur.  No friction rub. No gallop.   Pulmonary:     Effort: Pulmonary effort is normal. No respiratory distress.     Breath sounds: Normal breath sounds. No wheezing or rales.  Chest:     Chest wall: No tenderness.  Abdominal:     General: Bowel sounds are normal. There is no distension.     Palpations: Abdomen is soft. There is no mass.     Tenderness: There is no abdominal tenderness. There is no guarding or rebound.  Musculoskeletal: Normal range of motion.        General: Tenderness present.  Lymphadenopathy:     Cervical: No cervical adenopathy.  Skin:    General: Skin is warm and dry.     Findings: No rash.  Neurological:      Mental Status: He is alert and oriented to person, place, and time.     Cranial Nerves: No cranial nerve deficit.     Motor: No abnormal muscle tone.     Coordination: Coordination normal.     Gait: Gait normal.     Deep Tendon Reflexes: Reflexes are normal and symmetric.  Psychiatric:        Behavior: Behavior normal.        Thought Content: Thought content normal.        Judgment: Judgment normal.    occip nerve exit areas are painful to palpation, B post scull   Procedure Note :     occipital nerve Injection:   Indication : R, L occipital neuralgia.   Risks including unsuccessful procedure , bleeding, infection, bruising, skin atrophy and others were explained to the patient in detail as well as the benefits. Informed consent was obtained and signed.   Tthe patient was placed in a comfortable position. R occip nerve exit point was marked and  the skin was prepped with Betadine and alcohol. 11/2 inch 25-gauge needle was used. The needle was advanced perpendicular to the skin and I injected the site with 2 mL of 2% lidocaine and 20 mg of Depo-Medrol in a usual fashion. Procedure was repeated on the opposite side.  Band-Aid applied.   Tolerated well. Complications: None. Good pain relief following the procedure.  Lab Results  Component Value Date   WBC 4.5 03/11/2018   HGB 14.6 03/11/2018   HCT 43.8 03/11/2018   PLT 149.0 (L) 03/11/2018   GLUCOSE 105 (H) 05/26/2018   CHOL 156 03/11/2018   TRIG 183.0 (H) 03/11/2018   HDL 33.70 (L) 03/11/2018   LDLDIRECT 78.8 02/05/2011   LDLCALC 85 03/11/2018   ALT 19 05/26/2018   AST 20 05/26/2018   NA 138 05/26/2018   K 4.2 05/26/2018   CL 105 05/26/2018   CREATININE 1.12 05/26/2018   BUN 17 05/26/2018   CO2 27 05/26/2018   TSH 1.78 03/11/2018   PSA 0.00 (L) 03/11/2018   HGBA1C 5.7 03/07/2014    Ct Cardiac Scoring  Addendum Date: 06/16/2018   ADDENDUM REPORT: 06/16/2018 12:03 CLINICAL DATA:  Risk stratification EXAM: Coronary  Calcium Score TECHNIQUE: The patient was scanned on a [employer] scanner. Axial non-contrast 3 mm slices were carried out through the heart. The data set was analyzed on a dedicated work station and scored using the [place]. FINDINGS: Non-cardiac: See separate report from [facility]. Ascending Aorta: Normal size, mild diffuse calcifications in the aortic root. Pericardium: Normal. Coronary arteries: Normal origin. IMPRESSION: Coronary calcium score of 250. This was 54 percentile for age and sex matched control. Electronically Signed  By: Ena Dawley   On: 06/16/2018 12:03   Result Date: 06/16/2018 EXAM: OVER-READ INTERPRETATION  CT CHEST The following report is an over-read performed by radiologist Dr. Rolm Baptise of [facility], [place] on 06/16/2018. This over-read does not include interpretation of cardiac or coronary anatomy or pathology. The coronary calcium score interpretation by the cardiologist is attached. COMPARISON:  None. FINDINGS: Vascular: Heart is upper limits normal in size. Aorta normal caliber. Mediastinum/Nodes: No adenopathy in the lower mediastinum or hila. Lungs/Pleura: Lingular subpleural nodule measures 5 mm on image 15. Remainder of the visualized lungs clear. No effusions. Upper Abdomen: Imaging into the upper abdomen shows no acute findings. Musculoskeletal: Chest wall soft tissues are unremarkable. No acute bony abnormality. IMPRESSION: 5 mm lingular nodule. No follow-up needed if patient is low-risk. Non-contrast chest CT can be considered in 12 months if patient is high-risk. This recommendation follows the consensus statement: Guidelines for Management of Incidental Pulmonary Nodules Detected on CT Images: From the Fleischner Society 2017; Radiology 2017; 284:228-243. Electronically Signed: By: Rolm Baptise M.D. On: 06/16/2018 11:52    Assessment & Plan:   There are no diagnoses linked to this encounter.   No orders of the defined types were placed  in this encounter.    Follow-up: No follow-ups on file.  Walker Kehr, MD

## 2019-03-09 NOTE — Patient Instructions (Signed)
Occipital Neuralgia  Occipital neuralgia is a type of headache that causes brief episodes of very bad pain in the back of your head. Pain from occipital neuralgia may spread (radiate) to other parts of your head. These headaches may be caused by irritation of the nerves that leave your spinal cord high up in your neck, just below the base of your skull (occipital nerves). Your occipital nerves transmit sensations from the back of your head, the top of your head, and the areas behind your ears. What are the causes? This condition can occur without any known cause (primary headache syndrome). In other cases, this condition is caused by pressure on or irritation of one of the two occipital nerves. Pressure and irritation may be due to:  Muscle spasm in the neck.  Neck injury.  Wear and tear of the vertebrae in the neck (osteoarthritis).  Disease of the disks that separate the vertebrae.  Swollen blood vessels that put pressure on the occipital nerves.  Infections.  Tumors.  Diabetes. What are the signs or symptoms? This condition causes brief burning, stabbing, electric, shocking, or shooting pain which can radiate to the top of the head. It can happen on one side or both sides of the head. It can also cause:  Pain behind the eye.  Pain triggered by neck movement or hair brushing.  Scalp tenderness.  Aching in the back of the head between episodes of very bad pain.  Pain gets worse with exposure to bright lights. How is this diagnosed? There is no test that diagnoses this condition. Your health care provider may diagnose this condition based on a physical exam and your symptoms. Other tests may be done, such as:  Imaging studies of the brain and neck (cervical spine), such as an MRI or CT scan. These look for causes of pinched nerves.  Applying pressure to the nerves in the neck to try to re-create the pain.  Injection of numbing medicine into the occipital nerve areas to see if  pain goes away (diagnostic nerve block). How is this treated? Treatment for this condition may begin with simple measures, such as:  Rest.  Massage.  Applying heat or cold on the area.  Over-the-counter pain relievers. If these measures do not work, you may need other treatments, including:  Medicines, such as: ? Prescription-strength anti-inflammatory medicines. ? Muscle relaxants. ? Anti-seizure medicines, which can relieve pain. ? Antidepressants, which can relieve pain. ? Injected medicines, such as medicines that numb the area (local anesthetic) and steroids.  Pulsed radiofrequency ablation. This is when wires are implanted to deliver electrical impulses that block pain signals from the occipital nerve.  Surgery to relieve nerve pressure.  Physical therapy. Follow these instructions at home: Pain management      Avoid any activities that cause pain.  Rest when you have an attack of pain.  Try gentle massage to relieve pain.  Try a different pillow or sleeping position.  If directed, apply heat to the affected area as told by your health care provider. Use the heat source that your health care provider recommends, such as a moist heat pack or a heating pad. ? Place a towel between your skin and the heat source. ? Leave the heat on for 20-30 minutes. ? Remove the heat if your skin turns bright red. This is especially important if you are unable to feel pain, heat, or cold. You may have a greater risk of getting burned.  If directed, apply ice to the   back of the head and neck area as told by your health care provider. ? Put ice in a plastic bag. ? Place a towel between your skin and the bag. ? Leave the ice on for 20 minutes, 2-3 times per day. General instructions  Take over-the-counter and prescription medicines only as told by your health care provider.  Avoid things that make your symptoms worse, such as bright lights.  Try to stay active. Get regular  exercise that does not cause pain. Ask your health care provider to suggest safe exercises for you.  Work with a physical therapist to learn stretching exercises you can do at home.  Practice good posture.  Keep all follow-up visits as told by your health care provider. This is important. Contact a health care provider if:  Your medicine is not working.  You have new or worsening symptoms. Get help right away if:  You have very bad head pain that does not go away.  You have a sudden change in vision, balance, or speech. Summary  Occipital neuralgia is a type of headache that causes brief episodes of very bad pain in the back of your head.  Pain from occipital neuralgia may spread (radiate) to other parts of your head.  Treatment for this condition includes rest, massage, and medicines. This information is not intended to replace advice given to you by your health care provider. Make sure you discuss any questions you have with your health care provider. Document Released: 05/21/2001 Document Revised: 05/13/2017 Document Reviewed: 08/01/2016 Elsevier Patient Education  2020 [employer].

## 2019-03-09 NOTE — Assessment & Plan Note (Addendum)
Nerve block B Medrol pac Vit D Vit B coplex

## 2019-03-15 ENCOUNTER — Ambulatory Visit (INDEPENDENT_AMBULATORY_CARE_PROVIDER_SITE_OTHER): Payer: Medicare HMO | Admitting: Internal Medicine

## 2019-03-15 ENCOUNTER — Encounter: Payer: Self-pay | Admitting: Internal Medicine

## 2019-03-15 ENCOUNTER — Other Ambulatory Visit (INDEPENDENT_AMBULATORY_CARE_PROVIDER_SITE_OTHER): Payer: Medicare HMO

## 2019-03-15 ENCOUNTER — Other Ambulatory Visit: Payer: Self-pay

## 2019-03-15 VITALS — BP 118/74 | HR 57 | Temp 97.5°F | Ht 67.0 in | Wt 156.0 lb

## 2019-03-15 DIAGNOSIS — R0989 Other specified symptoms and signs involving the circulatory and respiratory systems: Secondary | ICD-10-CM

## 2019-03-15 DIAGNOSIS — Z0000 Encounter for general adult medical examination without abnormal findings: Secondary | ICD-10-CM | POA: Diagnosis not present

## 2019-03-15 DIAGNOSIS — I2510 Atherosclerotic heart disease of native coronary artery without angina pectoris: Secondary | ICD-10-CM

## 2019-03-15 DIAGNOSIS — M5481 Occipital neuralgia: Secondary | ICD-10-CM

## 2019-03-15 DIAGNOSIS — Z8546 Personal history of malignant neoplasm of prostate: Secondary | ICD-10-CM

## 2019-03-15 DIAGNOSIS — I2583 Coronary atherosclerosis due to lipid rich plaque: Secondary | ICD-10-CM | POA: Diagnosis not present

## 2019-03-15 DIAGNOSIS — Z23 Encounter for immunization: Secondary | ICD-10-CM | POA: Diagnosis not present

## 2019-03-15 LAB — CBC WITH DIFFERENTIAL/PLATELET
Basophils Absolute: 0 10*3/uL (ref 0.0–0.1)
Basophils Relative: 0.3 % (ref 0.0–3.0)
Eosinophils Absolute: 0 10*3/uL (ref 0.0–0.7)
Eosinophils Relative: 0.4 % (ref 0.0–5.0)
HCT: 44.8 % (ref 39.0–52.0)
Hemoglobin: 14.9 g/dL (ref 13.0–17.0)
Lymphocytes Relative: 22.7 % (ref 12.0–46.0)
Lymphs Abs: 1.7 10*3/uL (ref 0.7–4.0)
MCHC: 33.2 g/dL (ref 30.0–36.0)
MCV: 98.9 fl (ref 78.0–100.0)
Monocytes Absolute: 0.7 10*3/uL (ref 0.1–1.0)
Monocytes Relative: 9.9 % (ref 3.0–12.0)
Neutro Abs: 4.9 10*3/uL (ref 1.4–7.7)
Neutrophils Relative %: 66.7 % (ref 43.0–77.0)
Platelets: 172 10*3/uL (ref 150.0–400.0)
RBC: 4.53 Mil/uL (ref 4.22–5.81)
RDW: 12.9 % (ref 11.5–15.5)
WBC: 7.3 10*3/uL (ref 4.0–10.5)

## 2019-03-15 LAB — BASIC METABOLIC PANEL
BUN: 19 mg/dL (ref 6–23)
CO2: 30 mEq/L (ref 19–32)
Calcium: 9.7 mg/dL (ref 8.4–10.5)
Chloride: 103 mEq/L (ref 96–112)
Creatinine, Ser: 1.02 mg/dL (ref 0.40–1.50)
GFR: 70.2 mL/min (ref >60.00)
Glucose, Bld: 80 mg/dL (ref 70–99)
Potassium: 4.6 mEq/L (ref 3.5–5.1)
Sodium: 140 mEq/L (ref 135–145)

## 2019-03-15 LAB — HEPATIC FUNCTION PANEL
ALT: 16 U/L (ref 0–53)
AST: 16 U/L (ref 0–37)
Albumin: 4.5 g/dL (ref 3.5–5.2)
Alkaline Phosphatase: 60 U/L (ref 39–117)
Bilirubin, Direct: 0.2 mg/dL (ref 0.0–0.3)
Total Bilirubin: 1.4 mg/dL — ABNORMAL HIGH (ref 0.2–1.2)
Total Protein: 6.8 g/dL (ref 6.0–8.3)

## 2019-03-15 LAB — LIPID PANEL
Cholesterol: 143 mg/dL (ref 0–200)
HDL: 45.5 mg/dL (ref >39.00)
LDL Cholesterol: 71 mg/dL (ref 0–99)
NonHDL: 97.07
Total CHOL/HDL Ratio: 3
Triglycerides: 129 mg/dL (ref 0.0–149.0)
VLDL: 25.8 mg/dL (ref 0.0–40.0)

## 2019-03-15 LAB — URINALYSIS
Bilirubin Urine: NEGATIVE
Hgb urine dipstick: NEGATIVE
Ketones, ur: NEGATIVE
Leukocytes,Ua: NEGATIVE
Nitrite: NEGATIVE
Specific Gravity, Urine: 1.025 (ref 1.000–1.030)
Total Protein, Urine: NEGATIVE
Urine Glucose: NEGATIVE
Urobilinogen, UA: 0.2 (ref 0.0–1.0)
pH: 5.5 (ref 5.0–8.0)

## 2019-03-15 LAB — TSH: TSH: 2.02 u[IU]/mL (ref 0.35–4.50)

## 2019-03-15 LAB — PSA: PSA: 0 ng/mL — ABNORMAL LOW (ref 0.10–4.00)

## 2019-03-15 NOTE — Assessment & Plan Note (Signed)
Coronary calcium score of 250. This was 34 percentile for age and sex matched control.

## 2019-03-15 NOTE — Assessment & Plan Note (Signed)
PSA

## 2019-03-15 NOTE — Addendum Note (Signed)
Addended by: Karren Cobble on: 03/15/2019 10:45 AM   Modules accepted: Orders

## 2019-03-15 NOTE — Progress Notes (Signed)
Subjective:  Patient ID: Edwin Ramirez, male    DOB: 03/12/1941  Age: 78 y.o. MRN: [id number]  CC: No chief complaint on file.   HPI Edwin Ramirez presents for a well exam HA is much better  Outpatient Medications Prior to Visit  Medication Sig Dispense Refill   aspirin EC 81 MG tablet Take 1 tablet (81 mg total) by mouth daily. If tolerated 100 tablet 3   b complex vitamins tablet Take 1 tablet by mouth daily. 100 tablet 3   Cholecalciferol (VITAMIN D3) 50 MCG (2000 UT) capsule Take 1 capsule (2,000 Units total) by mouth daily. 100 capsule 3   clotrimazole-betamethasone (LOTRISONE) cream Apply 1 application topically 2 (two) times daily. 45 g 1   dicyclomine (BENTYL) 10 MG capsule Take 1 a day as needed for spasms 90 capsule 3   latanoprost (XALATAN) 0.005 % ophthalmic solution Place 1 drop into both eyes at bedtime.      LORazepam (ATIVAN) 1 MG tablet Take 1 tablet (1 mg total) by mouth every 8 (eight) hours as needed for anxiety. 90 tablet 1   methylPREDNISolone (MEDROL DOSEPAK) 4 MG TBPK tablet As directed 21 tablet 0   omeprazole (PRILOSEC) 40 MG capsule Take 1 capsule (40 mg total) by mouth 2 (two) times daily. 180 capsule 3   polyethylene glycol powder (GLYCOLAX/MIRALAX) powder Take 17 g by mouth daily.      rosuvastatin (CRESTOR) 10 MG tablet Take 1 tablet (10 mg total) by mouth daily. 90 tablet 3   Simethicone 125 MG CAPS Take by mouth. Takes one capsule after meals and at bedtime     traMADol (ULTRAM) 50 MG tablet Take 1 tablet (50 mg total) by mouth every 6 (six) hours as needed for severe pain. 60 tablet 0   traZODone (DESYREL) 50 MG tablet Take 1-2 tablets (50-100 mg total) by mouth at bedtime as needed for sleep. 60 tablet 5   No facility-administered medications prior to visit.     ROS: Review of Systems  Constitutional: Negative for appetite change, fatigue and unexpected weight change.  HENT: Negative for congestion, nosebleeds, sneezing,  sore throat and trouble swallowing.   Eyes: Negative for itching and visual disturbance.  Respiratory: Negative for cough.   Cardiovascular: Negative for chest pain, palpitations and leg swelling.  Gastrointestinal: Negative for abdominal distention, blood in stool, diarrhea and nausea.  Genitourinary: Negative for frequency and hematuria.  Musculoskeletal: Negative for back pain, gait problem, joint swelling and neck pain.  Skin: Negative for rash.  Neurological: Positive for headaches. Negative for dizziness, tremors, speech difficulty and weakness.  Psychiatric/Behavioral: Negative for agitation, dysphoric mood, sleep disturbance and suicidal ideas. The patient is not nervous/anxious.     Objective:  BP 118/74 (BP Location: Left Arm, Patient Position: Sitting, Cuff Size: Normal)    Pulse (!) 57    Temp (!) 97.5 F (36.4 C) (Oral)    Ht 5\' 7"  (1.702 m)    Wt 156 lb (70.8 kg)    SpO2 96%    BMI 24.43 kg/m   BP Readings from Last 3 Encounters:  03/15/19 118/74  03/09/19 (!) 116/58  01/25/19 124/68    Wt Readings from Last 3 Encounters:  03/15/19 156 lb (70.8 kg)  03/09/19 161 lb (73 kg)  01/25/19 164 lb (74.4 kg)    Physical Exam Constitutional:      General: He is not in acute distress.    Appearance: He is well-developed.     Comments: NAD  Eyes:     Conjunctiva/sclera: Conjunctivae normal.     Pupils: Pupils are equal, round, and reactive to light.  Neck:     Musculoskeletal: Normal range of motion.     Thyroid: No thyromegaly.     Vascular: No JVD.  Cardiovascular:     Rate and Rhythm: Normal rate and regular rhythm.     Heart sounds: Normal heart sounds. No murmur. No friction rub. No gallop.   Pulmonary:     Effort: Pulmonary effort is normal. No respiratory distress.     Breath sounds: Normal breath sounds. No wheezing or rales.  Chest:     Chest wall: No tenderness.  Abdominal:     General: Bowel sounds are normal. There is no distension.     Palpations:  Abdomen is soft. There is no mass.     Tenderness: There is no abdominal tenderness. There is no guarding or rebound.  Musculoskeletal: Normal range of motion.        General: No tenderness.  Lymphadenopathy:     Cervical: No cervical adenopathy.  Skin:    General: Skin is warm and dry.     Findings: No rash.  Neurological:     Mental Status: He is alert and oriented to person, place, and time.     Cranial Nerves: No cranial nerve deficit.     Motor: No abnormal muscle tone.     Coordination: Coordination normal.     Gait: Gait normal.     Deep Tendon Reflexes: Reflexes are normal and symmetric.  Psychiatric:        Behavior: Behavior normal.        Thought Content: Thought content normal.        Judgment: Judgment normal.   mild bruit B Neck petter Rectal NE - no prostate  Lab Results  Component Value Date   WBC 4.5 03/11/2018   HGB 14.6 03/11/2018   HCT 43.8 03/11/2018   PLT 149.0 (L) 03/11/2018   GLUCOSE 105 (H) 05/26/2018   CHOL 156 03/11/2018   TRIG 183.0 (H) 03/11/2018   HDL 33.70 (L) 03/11/2018   LDLDIRECT 78.8 02/05/2011   LDLCALC 85 03/11/2018   ALT 19 05/26/2018   AST 20 05/26/2018   NA 138 05/26/2018   K 4.2 05/26/2018   CL 105 05/26/2018   CREATININE 1.12 05/26/2018   BUN 17 05/26/2018   CO2 27 05/26/2018   TSH 1.78 03/11/2018   PSA 0.00 (L) 03/11/2018   HGBA1C 5.7 03/07/2014    Ct Cardiac Scoring  Addendum Date: 06/16/2018   ADDENDUM REPORT: 06/16/2018 12:03 CLINICAL DATA:  Risk stratification EXAM: Coronary Calcium Score TECHNIQUE: The patient was scanned on a [employer] scanner. Axial non-contrast 3 mm slices were carried out through the heart. The data set was analyzed on a dedicated work station and scored using the [place]. FINDINGS: Non-cardiac: See separate report from [facility]. Ascending Aorta: Normal size, mild diffuse calcifications in the aortic root. Pericardium: Normal. Coronary arteries: Normal origin. IMPRESSION:  Coronary calcium score of 250. This was 27 percentile for age and sex matched control. Electronically Signed   By: Ena Dawley   On: 06/16/2018 12:03   Result Date: 06/16/2018 EXAM: OVER-READ INTERPRETATION  CT CHEST The following report is an over-read performed by radiologist Dr. Rolm Baptise of [facility], [place] on 06/16/2018. This over-read does not include interpretation of cardiac or coronary anatomy or pathology. The coronary calcium score interpretation by the cardiologist is attached. COMPARISON:  None.  FINDINGS: Vascular: Heart is upper limits normal in size. Aorta normal caliber. Mediastinum/Nodes: No adenopathy in the lower mediastinum or hila. Lungs/Pleura: Lingular subpleural nodule measures 5 mm on image 15. Remainder of the visualized lungs clear. No effusions. Upper Abdomen: Imaging into the upper abdomen shows no acute findings. Musculoskeletal: Chest wall soft tissues are unremarkable. No acute bony abnormality. IMPRESSION: 5 mm lingular nodule. No follow-up needed if patient is low-risk. Non-contrast chest CT can be considered in 12 months if patient is high-risk. This recommendation follows the consensus statement: Guidelines for Management of Incidental Pulmonary Nodules Detected on CT Images: From the Fleischner Society 2017; Radiology 2017; 284:228-243. Electronically Signed: By: Rolm Baptise M.D. On: 06/16/2018 11:52    Assessment & Plan:   There are no diagnoses linked to this encounter.   No orders of the defined types were placed in this encounter.    Follow-up: No follow-ups on file.  Walker Kehr, MD

## 2019-03-15 NOTE — Assessment & Plan Note (Signed)
Carotid US 

## 2019-03-15 NOTE — Assessment & Plan Note (Signed)
Better after nerve block B

## 2019-03-17 ENCOUNTER — Other Ambulatory Visit: Payer: Self-pay

## 2019-03-17 DIAGNOSIS — R0989 Other specified symptoms and signs involving the circulatory and respiratory systems: Secondary | ICD-10-CM

## 2019-03-29 ENCOUNTER — Ambulatory Visit (HOSPITAL_COMMUNITY)
Admission: RE | Admit: 2019-03-29 | Discharge: 2019-03-29 | Disposition: A | Discharge status: Home / Self Care | Payer: Medicare HMO | Source: Ambulatory Visit | Attending: Cardiology | Admitting: Cardiology

## 2019-03-29 ENCOUNTER — Other Ambulatory Visit: Payer: Self-pay

## 2019-03-29 DIAGNOSIS — R0989 Other specified symptoms and signs involving the circulatory and respiratory systems: Secondary | ICD-10-CM | POA: Diagnosis not present

## 2019-04-05 ENCOUNTER — Ambulatory Visit (INDEPENDENT_AMBULATORY_CARE_PROVIDER_SITE_OTHER): Payer: Medicare HMO | Admitting: *Deleted

## 2019-04-05 DIAGNOSIS — Z0000 Encounter for general adult medical examination without abnormal findings: Secondary | ICD-10-CM | POA: Diagnosis not present

## 2019-04-05 NOTE — Progress Notes (Addendum)
Subjective:   Edwin Ramirez is a 78 y.o. male who presents for Medicare Annual/Subsequent preventive examination. I connected with patient by a telephone and verified that I am speaking with the correct person using two identifiers. Patient stated full name and DOB. Patient gave permission to continue with telephonic visit. Patient's location was at home and Nurse's location was at [place] office. Participants during this visit included patient and nurse.  Review of Systems:   Cardiac Risk Factors include: advanced age (>40mn, >>35women);dyslipidemia;male gender;hypertension Sleep patterns: feels rested on waking, gets up 1-2 times nightly to void and sleeps 7-8 hours nightly.    Home Safety/Smoke Alarms: Feels safe in home. Smoke alarms in place.  Living environment; residence and Firearm Safety: [place] can live on one level. Lives with wife, no needs for DME, good support system Seat Belt Safety/Bike Helmet: Wears seat belt.      Objective:    Vitals: There were no vitals taken for this visit.  There is no height or weight on file to calculate BMI.  Advanced Directives 04/05/2019 01/06/2016  Does Patient Have a Medical Advance Directive? Yes No  Type of AParamedicof ACavourLiving will -  Copy of [place] Chart? No - copy requested -  Would patient like information on creating a medical advance directive? - No - patient declined information    Tobacco Social History   Tobacco Use  Smoking Status Never Smoker  Smokeless Tobacco Never Used     Counseling given: Not Answered  Past Medical History:  Diagnosis Date  . Basal cell cancer    Dr LUbaldo Glassing . DDD (degenerative disc disease)   . Diverticulosis   . Esophageal stricture    Dr BOlevia Perches . Gastritis   . GERD (gastroesophageal reflux disease)   . Gilbert syndrome   . Glaucoma   . Hepatic cyst   . History of prostate cancer    Dr BAlinda Money . Hx of  adenomatous colonic polyps   . Hyperlipidemia    has had good cholesterol count for years.  . Internal hemorrhoids   . Pulmonary nodule    not seen on F/U 2014  . TMJ (dislocation of temporomandibular joint)    Past Surgical History:  Procedure Laterality Date  . APPENDECTOMY    . cataract surgery  2012   bilateral  . CHOLECYSTECTOMY  1985  . COLONOSCOPY W/ POLYPECTOMY  2004   Negative 2009 & 2013 Dr.Brodie  . ESOPHAGEAL DILATION  2004  . MOHS SURGERY     Basal Cell  . PROSTATECTOMY  07/2005   Dr.Borden  . ROTATOR CUFF REPAIR      X 2-Left; Dr MPercell Miller  Family History  Problem Relation Age of Onset  . Multiple myeloma Father   . Colon cancer Mother 682 . Heart failure Mother   . Heart attack Maternal Uncle 65  . Diabetes Neg Hx   . Stroke Neg Hx   . Esophageal cancer Neg Hx   . Rectal cancer Neg Hx   . Stomach cancer Neg Hx    Social History   Socioeconomic History  . Marital status: Married    Spouse name: Not on file  . Number of children: 2  . Years of education: Not on file  . Highest education level: Not on file  Occupational History  . Occupation: retired  SScientific laboratory technician . Financial resource strain: Not hard at all  . Food  insecurity    Worry: Never true    Inability: Never true  . Transportation needs    Medical: No    Non-medical: No  Tobacco Use  . Smoking status: Never Smoker  . Smokeless tobacco: Never Used  Substance and Sexual Activity  . Alcohol use: Yes    Alcohol/week: 5.0 standard drinks    Types: 5 Glasses of [name] per week    Comment: Socially  . Drug use: No  . Sexual activity: Not Currently  Lifestyle  . Physical activity    Days per week: 5 days    Minutes per session: 60 min  . Stress: Not at all  Relationships  . Social connections    Talks on phone: More than three times a week    Gets together: More than three times a week    Attends religious service: More than 4 times per year    Active member of club or organization:  Yes    Attends meetings of clubs or organizations: More than 4 times per year    Relationship status: Married  Other Topics Concern  . Not on file  Social History Narrative   DAILY CAFFEINE    Outpatient Encounter Medications as of 04/05/2019  Medication Sig  . b complex vitamins tablet Take 1 tablet by mouth daily.  . Cholecalciferol (VITAMIN D3) 50 MCG (2000 UT) capsule Take 1 capsule (2,000 Units total) by mouth daily.  . clotrimazole-betamethasone (LOTRISONE) cream Apply 1 application topically 2 (two) times daily.  Marland Kitchen dicyclomine (BENTYL) 10 MG capsule Take 1 a day as needed for spasms  . latanoprost (XALATAN) 0.005 % ophthalmic solution Place 1 drop into both eyes at bedtime.   Marland Kitchen LORazepam (ATIVAN) 1 MG tablet Take 1 tablet (1 mg total) by mouth every 8 (eight) hours as needed for anxiety.  Marland Kitchen omeprazole (PRILOSEC) 40 MG capsule Take 1 capsule (40 mg total) by mouth 2 (two) times daily.  . polyethylene glycol powder (GLYCOLAX/MIRALAX) powder Take 17 g by mouth daily.   . rosuvastatin (CRESTOR) 10 MG tablet Take 1 tablet (10 mg total) by mouth daily.  . Simethicone 125 MG CAPS Take by mouth. Takes one capsule after meals and at bedtime  . traMADol (ULTRAM) 50 MG tablet Take 1 tablet (50 mg total) by mouth every 6 (six) hours as needed for severe pain.  . traZODone (DESYREL) 50 MG tablet Take 1-2 tablets (50-100 mg total) by mouth at bedtime as needed for sleep.  . [DISCONTINUED] aspirin EC 81 MG tablet Take 1 tablet (81 mg total) by mouth daily. If tolerated (Patient not taking: Reported on 04/05/2019)  . [DISCONTINUED] methylPREDNISolone (MEDROL DOSEPAK) 4 MG TBPK tablet As directed (Patient not taking: Reported on 04/05/2019)   No facility-administered encounter medications on file as of 04/05/2019.     Activities of Daily Living In your present state of health, do you have any difficulty performing the following activities: 04/05/2019  Hearing? N  Vision? N  Difficulty  concentrating or making decisions? N  Walking or climbing stairs? N  Dressing or bathing? N  Doing errands, shopping? N  Preparing Food and eating ? N  Using the Toilet? N  In the past six months, have you accidently leaked urine? N  Do you have problems with loss of bowel control? N  Managing your Medications? N  Managing your Finances? N  Housekeeping or managing your Housekeeping? N  Some recent data might be hidden    Patient Care Team: Plotnikov, Tyrone Apple  V, MD as PCP - General (Internal Medicine) Raynelle Bring, MD as Consulting Physician (Urology) Loletha Carrow Kirke Corin, MD as Consulting Physician (Gastroenterology)   Assessment:   This is a routine wellness examination for Edwin Ramirez. Physical assessment deferred to PCP.  Exercise Activities and Dietary recommendations Current Exercise Habits: Home exercise routine, Type of exercise: walking, Time (Minutes): 60, Frequency (Times/Week): 6, Weekly Exercise (Minutes/Week): 360, Intensity: Mild, Exercise limited by: orthopedic condition(s) Diet (meal preparation, eat out, water intake, caffeinated beverages, dairy products, fruits and vegetables): in general, a "healthy" diet  , well balanced. eats a variety of fruits and vegetables daily,  Encouraged patient to increase daily water and healthy fluid intake.   Goals   None     Fall Risk Fall Risk  04/05/2019 03/15/2019 03/11/2018 03/04/2017 03/01/2016  Falls in the past year? 0 0 No Yes No  Number falls in past yr: 0 - - 1 -  Injury with Fall? 0 - - Yes -  Comment - - - - -  Follow up - Falls evaluation completed - - -   Is the patient's home free of loose throw rugs in walkways, pet beds, electrical cords, etc?   yes      Grab bars in the bathroom? yes      Handrails on the stairs?   yes      Adequate lighting?   yes  Depression Screen PHQ 2/9 Scores 03/15/2019 03/11/2018 03/04/2017 03/01/2016  PHQ - 2 Score 0 0 0 0    Cognitive Function     6CIT Screen 04/05/2019  What  Year? 0 points  What month? 0 points  What time? 0 points  Count back from 20 0 points  Months in reverse 0 points  Repeat phrase 0 points  Total Score 0    Immunization History  Administered Date(s) Administered  . Fluad Quad(high Dose 65+) 03/15/2019  . Influenza Split 03/28/2011, 03/05/2012  . Influenza Whole 03/16/2008, 04/06/2009, 02/08/2010  . Influenza, High Dose Seasonal PF 03/25/2013, 03/18/2017, 03/11/2018  . Influenza,inj,Quad PF,6+ Mos 03/17/2014, 03/01/2015, 03/01/2016  . Pneumococcal Conjugate-13 02/25/2014  . Pneumococcal Polysaccharide-23 01/19/2010, 03/01/2016  . Td 12/08/2006  . Tdap 03/11/2018  . Zoster 04/21/2007  . Zoster Recombinat (Shingrix) 10/12/2017, 12/30/2017   Screening Tests Health Maintenance  Topic Date Due  . TETANUS/TDAP  03/11/2028  . INFLUENZA VACCINE  Completed  . PNA vac Low Risk Adult  Completed       Plan:    Reviewed health maintenance screenings with patient today and relevant education, vaccines, and/or referrals were provided.   Continue to eat heart healthy diet (full of fruits, vegetables, whole grains, lean protein, water--limit salt, fat, and sugar intake) and increase physical activity as tolerated.  Continue doing brain stimulating activities (puzzles, reading, adult coloring books, staying active) to keep memory sharp.   I have personally reviewed and noted the following in the patient's chart:   . Medical and social history . Use of alcohol, tobacco or illicit drugs  . Current medications and supplements . Functional ability and status . Nutritional status . Physical activity . Advanced directives . List of other physicians . Screenings to include cognitive, depression, and falls . Referrals and appointments  In addition, I have reviewed and discussed with patient certain preventive protocols, quality metrics, and best practice recommendations. A written personalized care plan for preventive services as well as  general preventive health recommendations were provided to patient.     Michiel Cowboy, RN  04/05/2019  Medical screening examination/treatment/procedure(s) were performed by non-physician practitioner and as supervising physician I was immediately available for consultation/collaboration. I agree with above. Lew Dawes, MD

## 2019-04-28 DIAGNOSIS — R69 Illness, unspecified: Secondary | ICD-10-CM | POA: Diagnosis not present

## 2019-05-14 ENCOUNTER — Other Ambulatory Visit: Payer: Self-pay | Admitting: Internal Medicine

## 2019-06-10 ENCOUNTER — Other Ambulatory Visit: Payer: Self-pay

## 2019-06-10 ENCOUNTER — Encounter: Payer: Self-pay | Admitting: Internal Medicine

## 2019-06-10 ENCOUNTER — Ambulatory Visit (INDEPENDENT_AMBULATORY_CARE_PROVIDER_SITE_OTHER): Payer: Medicare HMO | Admitting: Internal Medicine

## 2019-06-10 DIAGNOSIS — L309 Dermatitis, unspecified: Secondary | ICD-10-CM

## 2019-06-10 MED ORDER — CLOTRIMAZOLE-BETAMETHASONE 1-0.05 % EX CREA: 1.0000 "application " | TOPICAL_CREAM | Freq: Two times a day (BID) | CUTANEOUS | 1 refills | Status: DC

## 2019-06-10 MED ORDER — METHYLPREDNISOLONE 4 MG PO TBPK: ORAL_TABLET | ORAL | 0 refills | Status: DC

## 2019-06-10 MED ORDER — DOXYCYCLINE HYCLATE 100 MG PO TABS: 100.0000 mg | ORAL_TABLET | Freq: Two times a day (BID) | ORAL | 0 refills | Status: DC

## 2019-06-13 ENCOUNTER — Encounter: Payer: Self-pay | Admitting: Internal Medicine

## 2019-06-13 DIAGNOSIS — L309 Dermatitis, unspecified: Secondary | ICD-10-CM | POA: Insufficient documentation

## 2019-06-13 NOTE — Addendum Note (Signed)
Addended by: Cassandria Anger on: 06/13/2019 09:26 AM   Modules accepted: Level of Service

## 2019-06-13 NOTE — Assessment & Plan Note (Addendum)
Severe. The patient states of eczema was triggered several weeks ago after he was cleaning his beta fish tank and cut his finger on the glass marble piece at the bottom.  He denies being exposed to a broken glass.  He thinks that a foreign body presence in the fingertip is highly unlikely. We will treat with Lotrisone cream, oral antibiotic, Medrol Dosepak Come back in 2 weeks if not better.  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

## 2019-06-13 NOTE — Progress Notes (Addendum)
Subjective:  Patient ID: Edwin Ramirez, male    DOB: Oct 09, 1940  Age: 79 y.o. MRN: [id number]  CC: No chief complaint on file.   HPI Edwin Ramirez presents for a painful rash on the fingertip of his right third finger The patient states of eczema was triggered several weeks ago after he was cleaning his beta fish tank and cut his finger on the glass marble piece at the bottom.  He denies being exposed to a broken glass.  He thinks that a foreign body presence in the fingertip is highly unlikely.   Outpatient Medications Prior to Visit  Medication Sig Dispense Refill  . b complex vitamins tablet Take 1 tablet by mouth daily. 100 tablet 3  . Cholecalciferol (VITAMIN D3) 50 MCG (2000 UT) capsule Take 1 capsule (2,000 Units total) by mouth daily. 100 capsule 3  . dicyclomine (BENTYL) 10 MG capsule Take 1 a day as needed for spasms 90 capsule 3  . latanoprost (XALATAN) 0.005 % ophthalmic solution Place 1 drop into both eyes at bedtime.     Marland Kitchen LORazepam (ATIVAN) 1 MG tablet TAKE 1 TABLET (1 MG TOTAL) BY MOUTH EVERY 8 (EIGHT) HOURS AS NEEDED FOR ANXIETY. 90 tablet 1  . omeprazole (PRILOSEC) 40 MG capsule Take 1 capsule (40 mg total) by mouth 2 (two) times daily. 180 capsule 3  . polyethylene glycol powder (GLYCOLAX/MIRALAX) powder Take 17 g by mouth daily.     . rosuvastatin (CRESTOR) 10 MG tablet Take 1 tablet (10 mg total) by mouth daily. 90 tablet 3  . Simethicone 125 MG CAPS Take by mouth. Takes one capsule after meals and at bedtime    . traMADol (ULTRAM) 50 MG tablet Take 1 tablet (50 mg total) by mouth every 6 (six) hours as needed for severe pain. 60 tablet 0  . traZODone (DESYREL) 50 MG tablet Take 1-2 tablets (50-100 mg total) by mouth at bedtime as needed for sleep. 60 tablet 5   No facility-administered medications prior to visit.    ROS: Review of Systems  Constitutional: Negative for chills and fever.  Skin: Positive for color change and rash. Negative for wound.      Objective:  BP 116/64 (BP Location: Left Arm, Patient Position: Sitting, Cuff Size: Normal)   Pulse (!) 58   Temp 97.8 F (36.6 C) (Oral)   Ht 5\' 7"  (1.702 m)   Wt 162 lb (73.5 kg)   SpO2 98%   BMI 25.37 kg/m   BP Readings from Last 3 Encounters:  06/10/19 116/64  03/15/19 118/74  03/09/19 (!) 116/58    Wt Readings from Last 3 Encounters:  06/10/19 162 lb (73.5 kg)  03/15/19 156 lb (70.8 kg)  03/09/19 161 lb (73 kg)    Physical Exam Constitutional:      General: He is not in acute distress.    Appearance: He is well-developed.     Comments: NAD  Eyes:     Conjunctiva/sclera: Conjunctivae normal.     Pupils: Pupils are equal, round, and reactive to light.  Neck:     Thyroid: No thyromegaly.     Vascular: No JVD.  Cardiovascular:     Rate and Rhythm: Normal rate and regular rhythm.     Heart sounds: Normal heart sounds. No murmur. No friction rub. No gallop.   Pulmonary:     Effort: Pulmonary effort is normal. No respiratory distress.     Breath sounds: Normal breath sounds. No wheezing or rales.  Chest:  Chest wall: No tenderness.  Abdominal:     General: Bowel sounds are normal. There is no distension.     Palpations: Abdomen is soft. There is no mass.     Tenderness: There is no abdominal tenderness. There is no guarding or rebound.  Musculoskeletal:        General: No tenderness. Normal range of motion.     Cervical back: Normal range of motion.  Lymphadenopathy:     Cervical: No cervical adenopathy.  Skin:    General: Skin is warm and dry.     Findings: No rash.  Neurological:     Mental Status: He is alert and oriented to person, place, and time.     Cranial Nerves: No cranial nerve deficit.     Motor: No abnormal muscle tone.     Coordination: Coordination normal.     Gait: Gait normal.     Deep Tendon Reflexes: Reflexes are normal and symmetric.  Psychiatric:        Behavior: Behavior normal.        Thought Content: Thought content normal.         Judgment: Judgment normal.   painful eczema rash on the tip of the finger  FTF>20 min discussing the problem and treatment options  Lab Results  Component Value Date   WBC 7.3 03/15/2019   HGB 14.9 03/15/2019   HCT 44.8 03/15/2019   PLT 172.0 03/15/2019   GLUCOSE 80 03/15/2019   CHOL 143 03/15/2019   TRIG 129.0 03/15/2019   HDL 45.50 03/15/2019   LDLDIRECT 78.8 02/05/2011   LDLCALC 71 03/15/2019   ALT 16 03/15/2019   AST 16 03/15/2019   NA 140 03/15/2019   K 4.6 03/15/2019   CL 103 03/15/2019   CREATININE 1.02 03/15/2019   BUN 19 03/15/2019   CO2 30 03/15/2019   TSH 2.02 03/15/2019   PSA 0.00 (L) 03/15/2019   HGBA1C 5.7 03/07/2014    VAS US CAROTID  Result Date: 03/30/2019 Carotid Arterial Duplex Study Indications:  Bilateral bruits. Patient denies any cerebrovascular symptoms at               this time. Risk Factors: No history of smoking, coronary artery disease. Performing Technologist: Mariane Masters RVT  Examination Guidelines: A complete evaluation includes B-mode imaging, spectral Doppler, color Doppler, and power Doppler as needed of all accessible portions of each vessel. Bilateral testing is considered an integral part of a complete examination. Limited examinations for reoccurring indications may be performed as noted.  Right Carotid Findings: +----------+--------+--------+--------+------------------+--------+           PSV cm/sEDV cm/sStenosisPlaque DescriptionComments +----------+--------+--------+--------+------------------+--------+ CCA Prox  127     13                                         +----------+--------+--------+--------+------------------+--------+ CCA Distal67      12                                         +----------+--------+--------+--------+------------------+--------+ ICA Prox  62      11                                         +----------+--------+--------+--------+------------------+--------+  ICA Mid   100      28      Normal                             +----------+--------+--------+--------+------------------+--------+ ICA Distal84      27                                         +----------+--------+--------+--------+------------------+--------+ ECA       74      7                                          +----------+--------+--------+--------+------------------+--------+ +----------+--------+-------+----------------+-------------------+           PSV cm/sEDV cmsDescribe        Arm Pressure (mmHG) +----------+--------+-------+----------------+-------------------+ [id number]             Multiphasic, [id number]                 +----------+--------+-------+----------------+-------------------+ +---------+--------+--+--------+-+---------+ VertebralPSV cm/s30EDV cm/s7Antegrade +---------+--------+--+--------+-+---------+  Left Carotid Findings: +----------+--------+--------+--------+------------------+--------------+           PSV cm/sEDV cm/sStenosisPlaque DescriptionComments       +----------+--------+--------+--------+------------------+--------------+ CCA Prox  144     21                                               +----------+--------+--------+--------+------------------+--------------+ CCA Distal84      15                                               +----------+--------+--------+--------+------------------+--------------+ ICA Prox  73      13      1-39%   hyperechoic       minimal plaque +----------+--------+--------+--------+------------------+--------------+ ICA Mid   85      24                                               +----------+--------+--------+--------+------------------+--------------+ ICA Distal55      14                                               +----------+--------+--------+--------+------------------+--------------+ ECA       57      7                                                 +----------+--------+--------+--------+------------------+--------------+ +----------+--------+--------+----------------+-------------------+           PSV cm/sEDV cm/sDescribe        Arm Pressure (mmHG) +----------+--------+--------+----------------+-------------------+ [id number]             Multiphasic, [id number]                 +----------+--------+--------+----------------+-------------------+ +---------+--------+--+--------+--+---------+  VertebralPSV cm/s37EDV cm/s11Antegrade +---------+--------+--+--------+--+---------+  Summary: Right Carotid: There is no evidence of stenosis in the right ICA. The                extracranial vessels were near-normal with only minimal wall                thickening or plaque. Left Carotid: Velocities in the left ICA are consistent with a 1-39% stenosis. Vertebrals:  Bilateral vertebral arteries demonstrate antegrade flow. Subclavians: Normal flow hemodynamics were seen in bilateral subclavian              arteries. *See table(s) above for measurements and observations.  Electronically signed by Larae Grooms MD on 03/30/2019 at 12:44:08 PM.    Final     Assessment & Plan:   There are no diagnoses linked to this encounter.   Meds ordered this encounter  Medications  . clotrimazole-betamethasone (LOTRISONE) cream    Sig: Apply 1 application topically 2 (two) times daily.    Dispense:  30 g    Refill:  1  . doxycycline (VIBRA-TABS) 100 MG tablet    Sig: Take 1 tablet (100 mg total) by mouth 2 (two) times daily.    Dispense:  28 tablet    Refill:  0  . methylPREDNISolone (MEDROL DOSEPAK) 4 MG TBPK tablet    Sig: As directed    Dispense:  21 tablet    Refill:  0     Follow-up: Return in about 2 weeks (around 06/24/2019) for a follow-up visit.  Walker Kehr, MD

## 2019-06-24 ENCOUNTER — Other Ambulatory Visit: Payer: Self-pay

## 2019-06-24 ENCOUNTER — Encounter: Payer: Self-pay | Admitting: Internal Medicine

## 2019-06-24 ENCOUNTER — Ambulatory Visit (INDEPENDENT_AMBULATORY_CARE_PROVIDER_SITE_OTHER): Payer: Medicare HMO | Admitting: Internal Medicine

## 2019-06-24 VITALS — BP 124/66 | HR 51 | Temp 97.6°F | Ht 67.0 in | Wt 160.0 lb

## 2019-06-24 DIAGNOSIS — L309 Dermatitis, unspecified: Secondary | ICD-10-CM

## 2019-06-24 MED ORDER — CIPROFLOXACIN HCL 500 MG PO TABS: 500.0000 mg | ORAL_TABLET | Freq: Two times a day (BID) | ORAL | 3 refills | Status: DC

## 2019-06-24 MED ORDER — SILVER SULFADIAZINE 1 % EX CREA: 1.0000 "application " | TOPICAL_CREAM | Freq: Two times a day (BID) | CUTANEOUS | 1 refills | Status: DC

## 2019-06-24 NOTE — Patient Instructions (Signed)
You can pre-medicate yourself with Benadryl 25 mg and Tylenol 650 mg 1 hour prior to the vaccination.  

## 2019-06-24 NOTE — Progress Notes (Signed)
Subjective:  Patient ID: Edwin Ramirez, male    DOB: 06-24-1940  Age: 79 y.o. MRN: [id number]  CC: No chief complaint on file.   HPI Edwin Ramirez presents for finger infection - not better at all on Doxy+cream       Outpatient Medications Prior to Visit  Medication Sig Dispense Refill  . b complex vitamins tablet Take 1 tablet by mouth daily. 100 tablet 3  . Cholecalciferol (VITAMIN D3) 50 MCG (2000 UT) capsule Take 1 capsule (2,000 Units total) by mouth daily. 100 capsule 3  . clotrimazole-betamethasone (LOTRISONE) cream Apply 1 application topically 2 (two) times daily. 30 g 1  . dicyclomine (BENTYL) 10 MG capsule Take 1 a day as needed for spasms 90 capsule 3  . doxycycline (VIBRA-TABS) 100 MG tablet Take 1 tablet (100 mg total) by mouth 2 (two) times daily. 28 tablet 0  . latanoprost (XALATAN) 0.005 % ophthalmic solution Place 1 drop into both eyes at bedtime.     Marland Kitchen LORazepam (ATIVAN) 1 MG tablet TAKE 1 TABLET (1 MG TOTAL) BY MOUTH EVERY 8 (EIGHT) HOURS AS NEEDED FOR ANXIETY. 90 tablet 1  . methylPREDNISolone (MEDROL DOSEPAK) 4 MG TBPK tablet As directed 21 tablet 0  . omeprazole (PRILOSEC) 40 MG capsule Take 1 capsule (40 mg total) by mouth 2 (two) times daily. 180 capsule 3  . polyethylene glycol powder (GLYCOLAX/MIRALAX) powder Take 17 g by mouth daily.     . rosuvastatin (CRESTOR) 10 MG tablet Take 1 tablet (10 mg total) by mouth daily. 90 tablet 3  . Simethicone 125 MG CAPS Take by mouth. Takes one capsule after meals and at bedtime    . traMADol (ULTRAM) 50 MG tablet Take 1 tablet (50 mg total) by mouth every 6 (six) hours as needed for severe pain. 60 tablet 0  . traZODone (DESYREL) 50 MG tablet Take 1-2 tablets (50-100 mg total) by mouth at bedtime as needed for sleep. 60 tablet 5   No facility-administered medications prior to visit.    ROS: Review of Systems  Constitutional: Negative for appetite change, fatigue and unexpected weight change.  HENT:  Negative for congestion, nosebleeds, sneezing, sore throat and trouble swallowing.   Eyes: Negative for itching and visual disturbance.  Respiratory: Negative for cough.   Cardiovascular: Negative for chest pain, palpitations and leg swelling.  Gastrointestinal: Negative for abdominal distention, blood in stool, diarrhea and nausea.  Genitourinary: Negative for frequency and hematuria.  Musculoskeletal: Negative for back pain, gait problem, joint swelling and neck pain.  Skin: Positive for rash and wound.  Neurological: Negative for dizziness, tremors, speech difficulty and weakness.  Psychiatric/Behavioral: Negative for agitation, dysphoric mood and sleep disturbance. The patient is not nervous/anxious.     Objective:  BP 124/66 (BP Location: Left Arm, Patient Position: Sitting, Cuff Size: Normal)   Pulse (!) 51   Temp 97.6 F (36.4 C) (Oral)   Ht 5\' 7"  (1.702 m)   Wt 160 lb (72.6 kg)   SpO2 96%   BMI 25.06 kg/m   BP Readings from Last 3 Encounters:  06/24/19 124/66  06/10/19 116/64  03/15/19 118/74    Wt Readings from Last 3 Encounters:  06/24/19 160 lb (72.6 kg)  06/10/19 162 lb (73.5 kg)  03/15/19 156 lb (70.8 kg)    Physical Exam Constitutional:      General: He is not in acute distress.    Appearance: Normal appearance.  Skin:    Findings: Erythema, lesion and rash present.  Neurological:     Mental Status: He is oriented to person, place, and time.    Finger rash - not better  A complex case discussed with patient.  Time spent 25 minutes  Lab Results  Component Value Date   WBC 7.3 03/15/2019   HGB 14.9 03/15/2019   HCT 44.8 03/15/2019   PLT 172.0 03/15/2019   GLUCOSE 80 03/15/2019   CHOL 143 03/15/2019   TRIG 129.0 03/15/2019   HDL 45.50 03/15/2019   LDLDIRECT 78.8 02/05/2011   LDLCALC 71 03/15/2019   ALT 16 03/15/2019   AST 16 03/15/2019   NA 140 03/15/2019   K 4.6 03/15/2019   CL 103 03/15/2019   CREATININE 1.02 03/15/2019   BUN 19  03/15/2019   CO2 30 03/15/2019   TSH 2.02 03/15/2019   PSA 0.00 (L) 03/15/2019   HGBA1C 5.7 03/07/2014    VAS US CAROTID  Result Date: 03/30/2019 Carotid Arterial Duplex Study Indications:  Bilateral bruits. Patient denies any cerebrovascular symptoms at               this time. Risk Factors: No history of smoking, coronary artery disease. Performing Technologist: Mariane Masters RVT  Examination Guidelines: A complete evaluation includes B-mode imaging, spectral Doppler, color Doppler, and power Doppler as needed of all accessible portions of each vessel. Bilateral testing is considered an integral part of a complete examination. Limited examinations for reoccurring indications may be performed as noted.  Right Carotid Findings: +----------+--------+--------+--------+------------------+--------+           PSV cm/sEDV cm/sStenosisPlaque DescriptionComments +----------+--------+--------+--------+------------------+--------+ CCA Prox  127     13                                         +----------+--------+--------+--------+------------------+--------+ CCA Distal67      12                                         +----------+--------+--------+--------+------------------+--------+ ICA Prox  62      11                                         +----------+--------+--------+--------+------------------+--------+ ICA Mid   100     28      Normal                             +----------+--------+--------+--------+------------------+--------+ ICA Distal84      27                                         +----------+--------+--------+--------+------------------+--------+ ECA       74      7                                          +----------+--------+--------+--------+------------------+--------+ +----------+--------+-------+----------------+-------------------+           PSV cm/sEDV cmsDescribe        Arm Pressure (mmHG)  +----------+--------+-------+----------------+-------------------+ [id number]  Multiphasic, [id number]                 +----------+--------+-------+----------------+-------------------+ +---------+--------+--+--------+-+---------+ VertebralPSV cm/s30EDV cm/s7Antegrade +---------+--------+--+--------+-+---------+  Left Carotid Findings: +----------+--------+--------+--------+------------------+--------------+           PSV cm/sEDV cm/sStenosisPlaque DescriptionComments       +----------+--------+--------+--------+------------------+--------------+ CCA Prox  144     21                                               +----------+--------+--------+--------+------------------+--------------+ CCA Distal84      15                                               +----------+--------+--------+--------+------------------+--------------+ ICA Prox  73      13      1-39%   hyperechoic       minimal plaque +----------+--------+--------+--------+------------------+--------------+ ICA Mid   85      24                                               +----------+--------+--------+--------+------------------+--------------+ ICA Distal55      14                                               +----------+--------+--------+--------+------------------+--------------+ ECA       57      7                                                +----------+--------+--------+--------+------------------+--------------+ +----------+--------+--------+----------------+-------------------+           PSV cm/sEDV cm/sDescribe        Arm Pressure (mmHG) +----------+--------+--------+----------------+-------------------+ [id number]             Multiphasic, [id number]                 +----------+--------+--------+----------------+-------------------+ +---------+--------+--+--------+--+---------+ VertebralPSV cm/s37EDV cm/s11Antegrade +---------+--------+--+--------+--+---------+   Summary: Right Carotid: There is no evidence of stenosis in the right ICA. The                extracranial vessels were near-normal with only minimal wall                thickening or plaque. Left Carotid: Velocities in the left ICA are consistent with a 1-39% stenosis. Vertebrals:  Bilateral vertebral arteries demonstrate antegrade flow. Subclavians: Normal flow hemodynamics were seen in bilateral subclavian              arteries. *See table(s) above for measurements and observations.  Electronically signed by Larae Grooms MD on 03/30/2019 at 12:44:08 PM.    Final     Assessment & Plan:   There are no diagnoses linked to this encounter.   No orders of the defined types were placed in this encounter.    Follow-up: No follow-ups on file.  Walker Kehr, MD

## 2019-06-28 ENCOUNTER — Encounter: Payer: Self-pay | Admitting: Internal Medicine

## 2019-06-28 NOTE — Assessment & Plan Note (Signed)
Not better. (The patient states of eczema was triggered several weeks ago after he was cleaning his beta fish tank and cut his finger on the glass marble piece at the bottom.  He denies being exposed to a broken glass.  He thinks that a foreign body presence in the fingertip is highly unlikely. 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  Silvadene cream topically.  Dermatology referral

## 2019-06-30 DIAGNOSIS — Z85828 Personal history of other malignant neoplasm of skin: Secondary | ICD-10-CM | POA: Diagnosis not present

## 2019-06-30 DIAGNOSIS — L0889 Other specified local infections of the skin and subcutaneous tissue: Secondary | ICD-10-CM | POA: Diagnosis not present

## 2019-06-30 DIAGNOSIS — A311 Cutaneous mycobacterial infection: Secondary | ICD-10-CM | POA: Diagnosis not present

## 2019-06-30 DIAGNOSIS — B0089 Other herpesviral infection: Secondary | ICD-10-CM | POA: Diagnosis not present

## 2019-07-14 DIAGNOSIS — L03012 Cellulitis of left finger: Secondary | ICD-10-CM | POA: Diagnosis not present

## 2019-07-14 DIAGNOSIS — L98499 Non-pressure chronic ulcer of skin of other sites with unspecified severity: Secondary | ICD-10-CM | POA: Diagnosis not present

## 2019-07-14 DIAGNOSIS — D485 Neoplasm of uncertain behavior of skin: Secondary | ICD-10-CM | POA: Diagnosis not present

## 2019-07-14 DIAGNOSIS — L0889 Other specified local infections of the skin and subcutaneous tissue: Secondary | ICD-10-CM | POA: Diagnosis not present

## 2019-07-14 DIAGNOSIS — Z85828 Personal history of other malignant neoplasm of skin: Secondary | ICD-10-CM | POA: Diagnosis not present

## 2019-07-20 ENCOUNTER — Telehealth: Payer: Self-pay

## 2019-07-20 ENCOUNTER — Other Ambulatory Visit: Payer: Self-pay

## 2019-07-20 ENCOUNTER — Ambulatory Visit: Payer: Medicare HMO | Admitting: Internal Medicine

## 2019-07-20 ENCOUNTER — Encounter: Payer: Self-pay | Admitting: Internal Medicine

## 2019-07-20 DIAGNOSIS — A311 Cutaneous mycobacterial infection: Secondary | ICD-10-CM

## 2019-07-20 MED ORDER — CLARITHROMYCIN 500 MG PO TABS: 500.0000 mg | ORAL_TABLET | Freq: Two times a day (BID) | ORAL | 2 refills | Status: DC

## 2019-07-20 MED ORDER — RIFAMPIN 300 MG PO CAPS: 600.0000 mg | ORAL_CAPSULE | Freq: Every day | ORAL | 2 refills | Status: DC

## 2019-07-20 NOTE — Telephone Encounter (Signed)
Received call from [facility] requesting office notes from patient's office visit with Dr. Linus Salmons. Informed her that the visit occurred this morning and RN would fax notes once completed by provider. Fax # [phone]  Carlean Purl, RN

## 2019-07-20 NOTE — Progress Notes (Signed)
[facility]      Reason for Consult: finger infection     Referring Physician: Dr. Ubaldo Glassing    Patient ID: Edwin Ramirez, male    DOB: [birth year], 79 y.o.   MRN: [id number]  HPI:   He is here for evaluation of his left middle finger.   His episode started when he was cleaning his fish tank and cut his finger while in the fish tank.  He initially used topical antibacterial cream but developed redness and went to see Dr. Alain Marion.  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  He went to see Dr. Ubaldo Glassing and had a biopsy done for path and also reportedly sent for culture.  The path was significant for suppurative and focal granulomatous reaction with granulation tissue and ulcer, though the AFB stain was negative.  He has had continued slow progression of the infection.  No associated fever or chills.     Past Medical History:  Diagnosis Date  . Basal cell cancer    Dr Ubaldo Glassing  . DDD (degenerative disc disease)   . Diverticulosis   . Esophageal stricture    Dr Olevia Perches  . Gastritis   . GERD (gastroesophageal reflux disease)   . Gilbert syndrome   . Glaucoma   . Hepatic cyst   . History of prostate cancer    Dr Alinda Money  . Hx of adenomatous colonic polyps   . Hyperlipidemia    has had good cholesterol count for years.  . Internal hemorrhoids   . Pulmonary nodule    not seen on F/U 2014  . TMJ (dislocation of temporomandibular joint)     Prior to Admission medications   Medication Sig Start Date End Date Taking? Authorizing Provider  b complex vitamins tablet Take 1 tablet by mouth daily. 03/09/19  Yes Plotnikov, Evie Lacks, MD  Cholecalciferol (VITAMIN D3) 50 MCG (2000 UT) capsule Take 1 capsule (2,000 Units total) by mouth daily. 03/09/19  Yes Plotnikov, Evie Lacks, MD  dicyclomine (BENTYL) 10 MG capsule Take 1 a day as needed for spasms 10/08/18  Yes Plotnikov, Evie Lacks, MD    latanoprost (XALATAN) 0.005 % ophthalmic solution Place 1 drop into both eyes at bedtime.    Yes [provider]  LORazepam (ATIVAN) 1 MG tablet TAKE 1 TABLET (1 MG TOTAL) BY MOUTH EVERY 8 (EIGHT) HOURS AS NEEDED FOR ANXIETY. 05/18/19  Yes Plotnikov, Evie Lacks, MD  omeprazole (PRILOSEC) 40 MG capsule Take 1 capsule (40 mg total) by mouth 2 (two) times daily. 05/26/18  Yes Plotnikov, Evie Lacks, MD  polyethylene glycol powder (GLYCOLAX/MIRALAX) powder Take 17 g by mouth daily.    Yes [provider]  rosuvastatin (CRESTOR) 10 MG tablet Take 1 tablet (10 mg total) by mouth daily. 10/08/18  Yes Plotnikov, Evie Lacks, MD  traZODone (DESYREL) 50 MG tablet Take 1-2 tablets (50-100 mg total) by mouth at bedtime as needed for sleep. 03/09/19  Yes Plotnikov, Evie Lacks, MD  clarithromycin (BIAXIN) 500 MG tablet Take 1 tablet (500 mg total) by mouth 2 (two) times daily. 07/20/19   Thayer Headings, MD  methylPREDNISolone (MEDROL DOSEPAK) 4 MG TBPK tablet As directed Patient not taking: Reported on 07/20/2019 06/10/19   Plotnikov, Evie Lacks, MD  rifampin (RIFADIN) 300 MG capsule Take 2 capsules (600 mg total) by mouth daily. 07/20/19   Thayer Headings, MD  silver sulfADIAZINE (SILVADENE) 1 %  cream Apply 1 application topically 2 (two) times daily. Patient not taking: Reported on 07/20/2019 06/24/19   Plotnikov, Evie Lacks, MD  Simethicone 125 MG CAPS Take by mouth. Takes one capsule after meals and at bedtime    [provider]  traMADol (ULTRAM) 50 MG tablet Take 1 tablet (50 mg total) by mouth every 6 (six) hours as needed for severe pain. Patient not taking: Reported on 07/20/2019 05/12/18   Plotnikov, Evie Lacks, MD    Allergies  Allergen Reactions  . Codeine     REACTION: agitation, mental status changes with high doses of codeine post op Can take Tramadol OK  . Nabumetone     Mental status changes post op with Relafen  . Pneumovax [Pneumococcal Polysaccharide Vaccine]     swelling  .  Aspirin     gastritis    Social History   Tobacco Use  . Smoking status: Never Smoker  . Smokeless tobacco: Never Used  Substance Use Topics  . Alcohol use: Yes    Alcohol/week: 5.0 standard drinks    Types: 5 Glasses of wine per week    Comment: Socially  . Drug use: No    Family History  Problem Relation Age of Onset  . Multiple myeloma Father   . Colon cancer Mother 38  . Heart failure Mother   . Heart attack Maternal Uncle 65  . Diabetes Neg Hx   . Stroke Neg Hx   . Esophageal cancer Neg Hx   . Rectal cancer Neg Hx   . Stomach cancer Neg Hx    Review of Systems  Constitutional: negative for fevers and chills Gastrointestinal: negative for diarrhea Integument/breast: negative for rash All other systems reviewed and are negative    Constitutional: in no apparent distress  Vitals:   07/20/19 0854  BP: 115/67  Pulse: 69  Temp: 97.6 F (36.4 C)   EYES: anicteric Musculoskeletal: left middle finger with swelling, an open ulceration, dry, no purulence, some tenderness, some erythema halfway down the distal portion.    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  Unfortunately, the path is relatively non-specific at this time, though with granulomatous inflammation and relatively slow progression, bacterial infection less likely.  Cultures pending but with some progression, will started empiric therapy with a macrolide based treatment and add rifampin as well, to cover many options, including marinum.     Recent LFTs wnl  Plan: 1) clarithromycin twice a day and rifampin daily Follow up in 2-3 weeks to check progression of finger.

## 2019-07-20 NOTE — Telephone Encounter (Signed)
Faxed completed office note to [facility].   Mozell Haber Lorita Officer, RN

## 2019-08-02 ENCOUNTER — Other Ambulatory Visit: Payer: Self-pay | Admitting: Internal Medicine

## 2019-08-03 ENCOUNTER — Other Ambulatory Visit: Payer: Self-pay

## 2019-08-03 ENCOUNTER — Encounter: Payer: Self-pay | Admitting: Internal Medicine

## 2019-08-03 ENCOUNTER — Ambulatory Visit: Payer: Medicare HMO | Admitting: Internal Medicine

## 2019-08-03 VITALS — BP 118/67 | HR 74 | Temp 97.5°F | Ht 67.0 in | Wt 151.5 lb

## 2019-08-03 DIAGNOSIS — A311 Cutaneous mycobacterial infection: Secondary | ICD-10-CM | POA: Diagnosis not present

## 2019-08-03 DIAGNOSIS — Z5181 Encounter for therapeutic drug level monitoring: Secondary | ICD-10-CM | POA: Insufficient documentation

## 2019-08-03 LAB — COMPLETE METABOLIC PANEL WITH GFR
AG Ratio: 1.9 (calc) (ref 1.0–2.5)
ALT: 23 U/L (ref 9–46)
AST: 25 U/L (ref 10–35)
Albumin: 4.1 g/dL (ref 3.6–5.1)
Alkaline phosphatase (APISO): 76 U/L (ref 35–144)
BUN: 15 mg/dL (ref 7–25)
CO2: 28 mmol/L (ref 20–32)
Calcium: 9 mg/dL (ref 8.6–10.3)
Chloride: 107 mmol/L (ref 98–110)
Creat: 1.06 mg/dL (ref 0.70–1.11)
GFR, Est African American: 76 mL/min/{1.73_m2} (ref >=60)
GFR, Est Non African American: 66 mL/min/{1.73_m2} (ref >=60)
Globulin: 2.2 g/dL (calc) (ref 1.9–3.7)
Glucose, Bld: 120 mg/dL — ABNORMAL HIGH (ref 65–99)
Potassium: 4.6 mmol/L (ref 3.5–5.3)
Sodium: 141 mmol/L (ref 135–146)
Total Bilirubin: 0.7 mg/dL (ref 0.2–1.2)
Total Protein: 6.3 g/dL (ref 6.1–8.1)

## 2019-08-03 NOTE — Assessment & Plan Note (Signed)
I will check his LFTs today

## 2019-08-03 NOTE — Progress Notes (Signed)
   Subjective:    Patient ID: Edwin Ramirez, male    DOB: January 23, 1941, 79 y.o.   MRN: [id number]  HPI Here for follow up of skin infection. Biopsy with granulomatous inflammation, AFB stain negative and no culture growth to date.  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  He has noted subtle improvement and no worsening.  Good tolerance of the medications.     Review of Systems  Constitutional: Negative for chills and fever.  Gastrointestinal: Negative for diarrhea and nausea.  Skin: Negative for rash.       Objective:   Physical Exam Constitutional:      Appearance: Normal appearance.  Eyes:     General: No scleral icterus. Pulmonary:     Effort: Pulmonary effort is normal.  Musculoskeletal:     Comments: Left mid finger with some decrease in the warmth, dry, less edema  Neurological:     Mental Status: He is alert.           Assessment & Plan:

## 2019-08-03 NOTE — Assessment & Plan Note (Signed)
Seems to have some improvement so will continue with rifampin and clarithromycin.  If it continues to work, I will plan for 3 months treatment He will follow up in 2 weeks

## 2019-08-12 DIAGNOSIS — L570 Actinic keratosis: Secondary | ICD-10-CM | POA: Diagnosis not present

## 2019-08-12 DIAGNOSIS — Z85828 Personal history of other malignant neoplasm of skin: Secondary | ICD-10-CM | POA: Diagnosis not present

## 2019-08-12 DIAGNOSIS — D1801 Hemangioma of skin and subcutaneous tissue: Secondary | ICD-10-CM | POA: Diagnosis not present

## 2019-08-12 DIAGNOSIS — L821 Other seborrheic keratosis: Secondary | ICD-10-CM | POA: Diagnosis not present

## 2019-08-12 DIAGNOSIS — A311 Cutaneous mycobacterial infection: Secondary | ICD-10-CM | POA: Diagnosis not present

## 2019-08-12 DIAGNOSIS — D485 Neoplasm of uncertain behavior of skin: Secondary | ICD-10-CM | POA: Diagnosis not present

## 2019-08-12 DIAGNOSIS — D0461 Carcinoma in situ of skin of right upper limb, including shoulder: Secondary | ICD-10-CM | POA: Diagnosis not present

## 2019-08-13 DIAGNOSIS — Z961 Presence of intraocular lens: Secondary | ICD-10-CM | POA: Diagnosis not present

## 2019-08-13 DIAGNOSIS — H35373 Puckering of macula, bilateral: Secondary | ICD-10-CM | POA: Diagnosis not present

## 2019-08-13 DIAGNOSIS — H401131 Primary open-angle glaucoma, bilateral, mild stage: Secondary | ICD-10-CM | POA: Diagnosis not present

## 2019-08-13 DIAGNOSIS — H43813 Vitreous degeneration, bilateral: Secondary | ICD-10-CM | POA: Diagnosis not present

## 2019-08-18 ENCOUNTER — Encounter: Payer: Self-pay | Admitting: Internal Medicine

## 2019-08-18 ENCOUNTER — Ambulatory Visit: Payer: Medicare HMO | Admitting: Internal Medicine

## 2019-08-18 ENCOUNTER — Other Ambulatory Visit: Payer: Self-pay

## 2019-08-18 VITALS — BP 113/68 | HR 65 | Temp 97.8°F | Ht 67.0 in | Wt 156.0 lb

## 2019-08-18 DIAGNOSIS — Z5181 Encounter for therapeutic drug level monitoring: Secondary | ICD-10-CM | POA: Diagnosis not present

## 2019-08-18 DIAGNOSIS — A311 Cutaneous mycobacterial infection: Secondary | ICD-10-CM | POA: Diagnosis not present

## 2019-08-18 LAB — COMPLETE METABOLIC PANEL WITH GFR
AG Ratio: 2.2 (calc) (ref 1.0–2.5)
ALT: 15 U/L (ref 9–46)
AST: 20 U/L (ref 10–35)
Albumin: 4.1 g/dL (ref 3.6–5.1)
Alkaline phosphatase (APISO): 78 U/L (ref 35–144)
BUN: 11 mg/dL (ref 7–25)
CO2: 31 mmol/L (ref 20–32)
Calcium: 9.1 mg/dL (ref 8.6–10.3)
Chloride: 106 mmol/L (ref 98–110)
Creat: 1.02 mg/dL (ref 0.70–1.11)
GFR, Est African American: 80 mL/min/{1.73_m2} (ref >=60)
GFR, Est Non African American: 69 mL/min/{1.73_m2} (ref >=60)
Globulin: 1.9 g/dL (calc) (ref 1.9–3.7)
Glucose, Bld: 113 mg/dL — ABNORMAL HIGH (ref 65–99)
Potassium: 4.5 mmol/L (ref 3.5–5.3)
Sodium: 140 mmol/L (ref 135–146)
Total Bilirubin: 0.4 mg/dL (ref 0.2–1.2)
Total Protein: 6 g/dL — ABNORMAL LOW (ref 6.1–8.1)

## 2019-08-18 NOTE — Assessment & Plan Note (Signed)
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  Plan to continue with this for 3 months minimum.  Sometimes needs extension with improvement but when not yet resolved.   Will follow up in 1 month

## 2019-08-18 NOTE — Assessment & Plan Note (Signed)
Will check LFTs again today

## 2019-08-18 NOTE — Progress Notes (Signed)
   Subjective:    Patient ID: Edwin Ramirez, male    DOB: [birth year], 79 y.o.   MRN: [id number]  HPI He is here for follow up of a granulomatous inflammation on biopsy of his finger lesion. He continues on empiric therapy with no growth on his culture sent at the time of the biopsy.  He is taking rifampin and clarithromycin with no issues.  Some improvement of the finger though asking why it is slow.  Less warmth, less erythema.  No fever.  Has less pain with his finger.  Asking about his mildly elevated glucose on the CMP.  Asking about the bills for treatment.  No associated rash or diarrhea.    Review of Systems  Constitutional: Negative for chills, fatigue and fever.  Gastrointestinal: Negative for diarrhea and nausea.  Skin: Negative for rash.  Neurological: Negative for dizziness.       Objective:   Physical Exam Musculoskeletal:     Comments:  finger is improved with less swelling, less redness.  Still with areas around the nail/distally of lesion, similar as before though drier.    Neurological:     Mental Status: He is alert.           Assessment & Plan:

## 2019-09-06 ENCOUNTER — Telehealth: Payer: Self-pay

## 2019-09-06 NOTE — Telephone Encounter (Signed)
New message    The patient calling on two antibiotics will it cover him due to a recent tick bite. please call back to discuss further.    1.rifampin (RIFADIN) 300 MG capsule 300 mg twice a day   2. clarithromycin (BIAXIN) 500 MG tablet 500 mg twice a day

## 2019-09-06 NOTE — Telephone Encounter (Signed)
Medications prescribed by infectious disease, will they cover a tick bite?

## 2019-09-07 ENCOUNTER — Telehealth: Payer: Self-pay

## 2019-09-07 NOTE — Telephone Encounter (Signed)
Yes.   Is he is finger better? Thanks

## 2019-09-07 NOTE — Telephone Encounter (Signed)
Patient called office today to discuss antibiotics with MD. States he recently had tick bite and would like to know if antibiotics we are prescribing would prevent any infection that could be related to bite or if he would need additional antibiotics. Has reached out to PCP regarding his concern has not heard back from them yet. [place]

## 2019-09-07 NOTE — Telephone Encounter (Signed)
Pt notified and he states it is much better

## 2019-09-13 ENCOUNTER — Ambulatory Visit: Payer: Medicare HMO | Admitting: Internal Medicine

## 2019-09-13 NOTE — Telephone Encounter (Signed)
No, his antibiotics do not prevent infections from tick borne pathogens.  I would only treat if he had any symptoms.

## 2019-09-15 ENCOUNTER — Encounter: Payer: Self-pay | Admitting: Internal Medicine

## 2019-09-15 ENCOUNTER — Other Ambulatory Visit: Payer: Self-pay

## 2019-09-15 ENCOUNTER — Ambulatory Visit: Payer: Medicare HMO | Admitting: Internal Medicine

## 2019-09-15 VITALS — BP 122/70 | HR 75 | Temp 97.4°F | Ht 67.0 in | Wt 156.0 lb

## 2019-09-15 DIAGNOSIS — Z5181 Encounter for therapeutic drug level monitoring: Secondary | ICD-10-CM

## 2019-09-15 DIAGNOSIS — A311 Cutaneous mycobacterial infection: Secondary | ICD-10-CM

## 2019-09-15 LAB — COMPLETE METABOLIC PANEL WITH GFR
AG Ratio: 2 (calc) (ref 1.0–2.5)
ALT: 16 U/L (ref 9–46)
AST: 22 U/L (ref 10–35)
Albumin: 4.2 g/dL (ref 3.6–5.1)
Alkaline phosphatase (APISO): 71 U/L (ref 35–144)
BUN: 16 mg/dL (ref 7–25)
CO2: 28 mmol/L (ref 20–32)
Calcium: 9.2 mg/dL (ref 8.6–10.3)
Chloride: 106 mmol/L (ref 98–110)
Creat: 0.97 mg/dL (ref 0.70–1.11)
GFR, Est African American: 85 mL/min/{1.73_m2} (ref >=60)
GFR, Est Non African American: 73 mL/min/{1.73_m2} (ref >=60)
Globulin: 2.1 g/dL (calc) (ref 1.9–3.7)
Glucose, Bld: 89 mg/dL (ref 65–99)
Potassium: 4.9 mmol/L (ref 3.5–5.3)
Sodium: 139 mmol/L (ref 135–146)
Total Bilirubin: 0.5 mg/dL (ref 0.2–1.2)
Total Protein: 6.3 g/dL (ref 6.1–8.1)

## 2019-09-15 MED ORDER — RIFAMPIN 300 MG PO CAPS: 600.0000 mg | ORAL_CAPSULE | Freq: Every day | ORAL | 2 refills | Status: DC

## 2019-09-15 MED ORDER — CLARITHROMYCIN 500 MG PO TABS: 500.0000 mg | ORAL_TABLET | Freq: Two times a day (BID) | ORAL | 2 refills | Status: DC

## 2019-09-15 NOTE — Progress Notes (Signed)
   Subjective:    Patient ID: Edwin Ramirez, male    DOB: January 22, 1941, 79 y.o.   MRN: [id number]  HPI He is here for follow up of a granulomatous inflammation on biopsy of his finger lesion. He continues on empiric therapy with no growth on his culture sent at the time of the biopsy.  He is taking rifampin and clarithromycin with no issues. He is now concerned because he has a small erythematous bump on the same finger and is concerned that the infection is spreading.  The appearance is similar to the appearance of his original lesion.  He is asking about options and if he should consider amputation to stop the spread.    Review of Systems  Constitutional: Negative for chills, fatigue and fever.  Gastrointestinal: Negative for diarrhea and nausea.  Skin: Negative for rash.  Neurological: Negative for dizziness.       Objective:   Physical Exam Musculoskeletal:     Comments:  finger is improved with no swelling, no redness, no tenderness. Area more proximally is a small, erythematous bump that is non tender, no warmth.   Neurological:     Mental Status: He is alert.   SH: No tobacco        Assessment & Plan:

## 2019-09-15 NOTE — Assessment & Plan Note (Signed)
New area of concern on the same finger, though does not look particularly infectious appearing.  I think a biopsy of this with culture though is warrranted and will reach out to Dr. Ubaldo Glassing to help facilitate this to see if it is also a granulomatous inflammatory lesion.

## 2019-09-15 NOTE — Assessment & Plan Note (Signed)
Will recheck his LFTs 

## 2019-09-16 ENCOUNTER — Telehealth: Payer: Self-pay | Admitting: *Deleted

## 2019-09-16 NOTE — Telephone Encounter (Signed)
[facility] ---requesting STAT appointment (Dr. Ubaldo Glassing)  for patient-Lt 3rd finger new spot/Biopsy by Dr. Linus Salmons.  (Wendy)-scheduling--stated will Talk to Dr. Rolm Bookbinder to see if patient can get in stat and they will contact the patient for an appointment.

## 2019-09-16 NOTE — Telephone Encounter (Signed)
Received call today from [facility] regarding urgent appt with Dr. Ubaldo Glassing. [place], Scheduling states patient is unable to make appointment for today or tomorrow. Will try to schedule appointment for Tuesday.  [place]

## 2019-09-20 ENCOUNTER — Ambulatory Visit (INDEPENDENT_AMBULATORY_CARE_PROVIDER_SITE_OTHER): Payer: Medicare HMO | Admitting: Internal Medicine

## 2019-09-20 ENCOUNTER — Encounter: Payer: Self-pay | Admitting: Internal Medicine

## 2019-09-20 ENCOUNTER — Other Ambulatory Visit: Payer: Self-pay

## 2019-09-20 DIAGNOSIS — A311 Cutaneous mycobacterial infection: Secondary | ICD-10-CM | POA: Diagnosis not present

## 2019-09-20 DIAGNOSIS — I2583 Coronary atherosclerosis due to lipid rich plaque: Secondary | ICD-10-CM

## 2019-09-20 DIAGNOSIS — I2510 Atherosclerotic heart disease of native coronary artery without angina pectoris: Secondary | ICD-10-CM

## 2019-09-20 NOTE — Progress Notes (Signed)
Subjective:  Patient ID: Edwin Ramirez, male    DOB: 11-Dec-1940  Age: 79 y.o. MRN: [id number]  CC: No chief complaint on file.   HPI Edwin Ramirez presents for his finger granuloma - another bx tomorrow w/Dr Ubaldo Glassing - on abx po (Biaxin, Rifadin)  Outpatient Medications Prior to Visit  Medication Sig Dispense Refill  . b complex vitamins tablet Take 1 tablet by mouth daily. 100 tablet 3  . Cholecalciferol (VITAMIN D3) 50 MCG (2000 UT) capsule Take 1 capsule (2,000 Units total) by mouth daily. 100 capsule 3  . clarithromycin (BIAXIN) 500 MG tablet Take 1 tablet (500 mg total) by mouth 2 (two) times daily. 60 tablet 2  . dicyclomine (BENTYL) 10 MG capsule Take 1 a day as needed for spasms 90 capsule 3  . latanoprost (XALATAN) 0.005 % ophthalmic solution Place 1 drop into both eyes at bedtime.     Marland Kitchen LORazepam (ATIVAN) 1 MG tablet TAKE 1 TABLET (1 MG TOTAL) BY MOUTH EVERY 8 (EIGHT) HOURS AS NEEDED FOR ANXIETY. 90 tablet 1  . omeprazole (PRILOSEC) 40 MG capsule TAKE 1 CAPSULE BY MOUTH TWICE A DAY 180 capsule 1  . polyethylene glycol powder (GLYCOLAX/MIRALAX) powder Take 17 g by mouth daily.     . rifampin (RIFADIN) 300 MG capsule Take 2 capsules (600 mg total) by mouth daily. 60 capsule 2  . rosuvastatin (CRESTOR) 10 MG tablet Take 1 tablet (10 mg total) by mouth daily. 90 tablet 3  . silver sulfADIAZINE (SILVADENE) 1 % cream Apply 1 application topically 2 (two) times daily. 25 g 1  . Simethicone 125 MG CAPS Take by mouth. Takes one capsule after meals and at bedtime    . traMADol (ULTRAM) 50 MG tablet Take 1 tablet (50 mg total) by mouth every 6 (six) hours as needed for severe pain. 60 tablet 0  . traZODone (DESYREL) 50 MG tablet Take 1-2 tablets (50-100 mg total) by mouth at bedtime as needed for sleep. 60 tablet 5   No facility-administered medications prior to visit.    ROS: Review of Systems  Constitutional: Negative for appetite change, fatigue and unexpected weight  change.  HENT: Negative for congestion, nosebleeds, sneezing, sore throat and trouble swallowing.   Eyes: Negative for itching and visual disturbance.  Respiratory: Negative for cough.   Cardiovascular: Negative for chest pain, palpitations and leg swelling.  Gastrointestinal: Negative for abdominal distention, blood in stool, diarrhea and nausea.  Genitourinary: Negative for frequency and hematuria.  Musculoskeletal: Positive for arthralgias. Negative for back pain, gait problem, joint swelling and neck pain.  Skin: Positive for rash.  Neurological: Negative for dizziness, tremors, speech difficulty and weakness.  Psychiatric/Behavioral: Negative for agitation, dysphoric mood, sleep disturbance and suicidal ideas. The patient is not nervous/anxious.     Objective:  BP 128/76 (BP Location: Left Arm, Patient Position: Sitting, Cuff Size: Normal)   Pulse (!) 52   Temp 98.2 F (36.8 C) (Oral)   Ht 5\' 7"  (1.702 m)   Wt 156 lb (70.8 kg)   SpO2 98%   BMI 24.43 kg/m   BP Readings from Last 3 Encounters:  09/20/19 128/76  09/15/19 122/70  08/18/19 113/68    Wt Readings from Last 3 Encounters:  09/20/19 156 lb (70.8 kg)  09/15/19 156 lb (70.8 kg)  08/18/19 156 lb (70.8 kg)    Physical Exam Constitutional:      General: He is not in acute distress.    Appearance: He is well-developed.  Comments: NAD  Eyes:     Conjunctiva/sclera: Conjunctivae normal.     Pupils: Pupils are equal, round, and reactive to light.  Neck:     Thyroid: No thyromegaly.     Vascular: No JVD.  Cardiovascular:     Rate and Rhythm: Normal rate and regular rhythm.     Heart sounds: Normal heart sounds. No murmur. No friction rub. No gallop.   Pulmonary:     Effort: Pulmonary effort is normal. No respiratory distress.     Breath sounds: Normal breath sounds. No wheezing or rales.  Chest:     Chest wall: No tenderness.  Abdominal:     General: Bowel sounds are normal. There is no distension.      Palpations: Abdomen is soft. There is no mass.     Tenderness: There is no abdominal tenderness. There is no guarding or rebound.  Musculoskeletal:        General: No tenderness. Normal range of motion.     Cervical back: Normal range of motion.  Lymphadenopathy:     Cervical: No cervical adenopathy.  Skin:    General: Skin is warm and dry.     Findings: No rash.  Neurological:     Mental Status: He is alert and oriented to person, place, and time.     Cranial Nerves: No cranial nerve deficit.     Motor: No abnormal muscle tone.     Coordination: Coordination normal.     Gait: Gait normal.     Deep Tendon Reflexes: Reflexes are normal and symmetric.  Psychiatric:        Behavior: Behavior normal.        Thought Content: Thought content normal.        Judgment: Judgment normal.     Lab Results  Component Value Date   WBC 7.3 03/15/2019   HGB 14.9 03/15/2019   HCT 44.8 03/15/2019   PLT 172.0 03/15/2019   GLUCOSE 89 09/15/2019   CHOL 143 03/15/2019   TRIG 129.0 03/15/2019   HDL 45.50 03/15/2019   LDLDIRECT 78.8 02/05/2011   LDLCALC 71 03/15/2019   ALT 16 09/15/2019   AST 22 09/15/2019   NA 139 09/15/2019   K 4.9 09/15/2019   CL 106 09/15/2019   CREATININE 0.97 09/15/2019   BUN 16 09/15/2019   CO2 28 09/15/2019   TSH 2.02 03/15/2019   PSA 0.00 (L) 03/15/2019   HGBA1C 5.7 03/07/2014    VAS US CAROTID  Result Date: 03/30/2019 Carotid Arterial Duplex Study Indications:  Bilateral bruits. Patient denies any cerebrovascular symptoms at               this time. Risk Factors: No history of smoking, coronary artery disease. Performing Technologist: Mariane Masters RVT  Examination Guidelines: A complete evaluation includes B-mode imaging, spectral Doppler, color Doppler, and power Doppler as needed of all accessible portions of each vessel. Bilateral testing is considered an integral part of a complete examination. Limited examinations for reoccurring indications may be  performed as noted.  Right Carotid Findings: +----------+--------+--------+--------+------------------+--------+           PSV cm/sEDV cm/sStenosisPlaque DescriptionComments +----------+--------+--------+--------+------------------+--------+ CCA Prox  127     13                                         +----------+--------+--------+--------+------------------+--------+ CCA Distal67      12                                         +----------+--------+--------+--------+------------------+--------+  ICA Prox  62      11                                         +----------+--------+--------+--------+------------------+--------+ ICA Mid   100     28      Normal                             +----------+--------+--------+--------+------------------+--------+ ICA Distal84      27                                         +----------+--------+--------+--------+------------------+--------+ ECA       74      7                                          +----------+--------+--------+--------+------------------+--------+ +----------+--------+-------+----------------+-------------------+           PSV cm/sEDV cmsDescribe        Arm Pressure (mmHG) +----------+--------+-------+----------------+-------------------+ [id number]             Multiphasic, [id number]                 +----------+--------+-------+----------------+-------------------+ +---------+--------+--+--------+-+---------+ VertebralPSV cm/s30EDV cm/s7Antegrade +---------+--------+--+--------+-+---------+  Left Carotid Findings: +----------+--------+--------+--------+------------------+--------------+           PSV cm/sEDV cm/sStenosisPlaque DescriptionComments       +----------+--------+--------+--------+------------------+--------------+ CCA Prox  144     21                                               +----------+--------+--------+--------+------------------+--------------+ CCA Distal84       15                                               +----------+--------+--------+--------+------------------+--------------+ ICA Prox  73      13      1-39%   hyperechoic       minimal plaque +----------+--------+--------+--------+------------------+--------------+ ICA Mid   85      24                                               +----------+--------+--------+--------+------------------+--------------+ ICA Distal55      14                                               +----------+--------+--------+--------+------------------+--------------+ ECA       57      7                                                +----------+--------+--------+--------+------------------+--------------+ +----------+--------+--------+----------------+-------------------+  PSV cm/sEDV cm/sDescribe        Arm Pressure (mmHG) +----------+--------+--------+----------------+-------------------+ [id number]             Multiphasic, [id number]                 +----------+--------+--------+----------------+-------------------+ +---------+--------+--+--------+--+---------+ VertebralPSV cm/s37EDV cm/s11Antegrade +---------+--------+--+--------+--+---------+  Summary: Right Carotid: There is no evidence of stenosis in the right ICA. The                extracranial vessels were near-normal with only minimal wall                thickening or plaque. Left Carotid: Velocities in the left ICA are consistent with a 1-39% stenosis. Vertebrals:  Bilateral vertebral arteries demonstrate antegrade flow. Subclavians: Normal flow hemodynamics were seen in bilateral subclavian              arteries. *See table(s) above for measurements and observations.  Electronically signed by Larae Grooms MD on 03/30/2019 at 12:44:08 PM.    Final     Assessment & Plan:     Follow-up: No follow-ups on file.  Walker Kehr, MD

## 2019-09-20 NOTE — Assessment & Plan Note (Signed)
No CP 

## 2019-09-21 DIAGNOSIS — Z008 Encounter for other general examination: Secondary | ICD-10-CM | POA: Diagnosis not present

## 2019-09-21 DIAGNOSIS — A311 Cutaneous mycobacterial infection: Secondary | ICD-10-CM | POA: Diagnosis not present

## 2019-09-21 DIAGNOSIS — Z79899 Other long term (current) drug therapy: Secondary | ICD-10-CM | POA: Diagnosis not present

## 2019-09-21 DIAGNOSIS — Z85828 Personal history of other malignant neoplasm of skin: Secondary | ICD-10-CM | POA: Diagnosis not present

## 2019-09-21 DIAGNOSIS — L280 Lichen simplex chronicus: Secondary | ICD-10-CM | POA: Diagnosis not present

## 2019-09-25 ENCOUNTER — Encounter: Payer: Self-pay | Admitting: Internal Medicine

## 2019-09-25 NOTE — Assessment & Plan Note (Signed)
On antibiotics. New lesion bx is pending.

## 2019-09-27 ENCOUNTER — Other Ambulatory Visit: Payer: Self-pay | Admitting: Internal Medicine

## 2019-10-13 ENCOUNTER — Encounter: Payer: Self-pay | Admitting: Internal Medicine

## 2019-10-13 ENCOUNTER — Ambulatory Visit: Payer: Medicare HMO | Admitting: Internal Medicine

## 2019-10-13 ENCOUNTER — Other Ambulatory Visit: Payer: Self-pay

## 2019-10-13 VITALS — BP 113/64 | HR 50 | Wt 154.0 lb

## 2019-10-13 DIAGNOSIS — Z5181 Encounter for therapeutic drug level monitoring: Secondary | ICD-10-CM | POA: Diagnosis not present

## 2019-10-13 DIAGNOSIS — A311 Cutaneous mycobacterial infection: Secondary | ICD-10-CM

## 2019-10-13 NOTE — Assessment & Plan Note (Signed)
I will have him continue with the rifampin and azithromycin for another 1-2 months to be sure to clear it up as he continues to tolerate it well. rtc 4 weeks

## 2019-10-13 NOTE — Assessment & Plan Note (Signed)
Will check his liver enzymes

## 2019-10-13 NOTE — Progress Notes (Signed)
   Subjective:    Patient ID: Edwin Ramirez, male    DOB: 03/16/1941, 79 y.o.   MRN: [id number]  HPI He is here for follow up of a granulomatous inflammation on biopsy of his finger lesion. He continues on empiric therapy with no growth on his culture sent at the time of the biopsy.  He is taking rifampin and clarithromycin with no issues.  We were concerned last visit due to a new area on his same finger and Dr. Ubaldo Glassing biopsied it and was benign (lichen simplex)  No new areas.    Review of Systems  Gastrointestinal: Negative for diarrhea and nausea.  Skin: Negative for rash.  Neurological: Negative for dizziness.       Objective:   Physical Exam Eyes:     General: No scleral icterus. Musculoskeletal:     Comments: Finger really seems resolved, little or no erythema, skin changes.  Neurological:     Mental Status: He is alert.  Psychiatric:        Mood and Affect: Mood normal.           Assessment & Plan:

## 2019-10-14 LAB — COMPLETE METABOLIC PANEL WITH GFR
AG Ratio: 2.2 (calc) (ref 1.0–2.5)
ALT: 15 U/L (ref 9–46)
AST: 21 U/L (ref 10–35)
Albumin: 4.1 g/dL (ref 3.6–5.1)
Alkaline phosphatase (APISO): 65 U/L (ref 35–144)
BUN: 18 mg/dL (ref 7–25)
CO2: 30 mmol/L (ref 20–32)
Calcium: 9.1 mg/dL (ref 8.6–10.3)
Chloride: 107 mmol/L (ref 98–110)
Creat: 1.07 mg/dL (ref 0.70–1.11)
GFR, Est African American: 76 mL/min/{1.73_m2} (ref >=60)
GFR, Est Non African American: 65 mL/min/{1.73_m2} (ref >=60)
Globulin: 1.9 g/dL (calc) (ref 1.9–3.7)
Glucose, Bld: 116 mg/dL — ABNORMAL HIGH (ref 65–99)
Potassium: 5 mmol/L (ref 3.5–5.3)
Sodium: 141 mmol/L (ref 135–146)
Total Bilirubin: 0.4 mg/dL (ref 0.2–1.2)
Total Protein: 6 g/dL — ABNORMAL LOW (ref 6.1–8.1)

## 2019-10-27 DIAGNOSIS — R69 Illness, unspecified: Secondary | ICD-10-CM | POA: Diagnosis not present

## 2019-11-22 ENCOUNTER — Encounter: Payer: Self-pay | Admitting: Internal Medicine

## 2019-11-22 ENCOUNTER — Ambulatory Visit: Payer: Medicare HMO | Admitting: Internal Medicine

## 2019-11-22 ENCOUNTER — Other Ambulatory Visit: Payer: Self-pay

## 2019-11-22 VITALS — BP 119/67 | HR 52 | Temp 97.9°F | Wt 156.0 lb

## 2019-11-22 DIAGNOSIS — Z5181 Encounter for therapeutic drug level monitoring: Secondary | ICD-10-CM

## 2019-11-22 DIAGNOSIS — A311 Cutaneous mycobacterial infection: Secondary | ICD-10-CM | POA: Diagnosis not present

## 2019-11-22 LAB — COMPLETE METABOLIC PANEL WITH GFR
AG Ratio: 2 (calc) (ref 1.0–2.5)
ALT: 16 U/L (ref 9–46)
AST: 20 U/L (ref 10–35)
Albumin: 4 g/dL (ref 3.6–5.1)
Alkaline phosphatase (APISO): 72 U/L (ref 35–144)
BUN: 16 mg/dL (ref 7–25)
CO2: 28 mmol/L (ref 20–32)
Calcium: 9 mg/dL (ref 8.6–10.3)
Chloride: 107 mmol/L (ref 98–110)
Creat: 1 mg/dL (ref 0.70–1.11)
GFR, Est African American: 81 mL/min/{1.73_m2} (ref >=60)
GFR, Est Non African American: 70 mL/min/{1.73_m2} (ref >=60)
Globulin: 2 g/dL (calc) (ref 1.9–3.7)
Glucose, Bld: 113 mg/dL — ABNORMAL HIGH (ref 65–99)
Potassium: 4.2 mmol/L (ref 3.5–5.3)
Sodium: 140 mmol/L (ref 135–146)
Total Bilirubin: 0.5 mg/dL (ref 0.2–1.2)
Total Protein: 6 g/dL — ABNORMAL LOW (ref 6.1–8.1)

## 2019-11-22 NOTE — Assessment & Plan Note (Signed)
Will check his LFTs

## 2019-11-22 NOTE — Assessment & Plan Note (Signed)
Resolved.  At this point will stop antibiotics.  he will call if there are new concerns in the future but otherwise can rtc prn

## 2019-11-22 NOTE — Progress Notes (Signed)
   Subjective:    Patient ID: Edwin Ramirez, male    DOB: 1940-11-30, 79 y.o.   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 He continues on empiric therapy with no growth on his culture sent at the time of the biopsy.  He is taking rifampin and clarithromycin with no issues.   No new areas. Had a biopsy of a new area previously but not c/w infection.  Finger continues to look improved.  No issues with LFTs.     Review of Systems  Gastrointestinal: Negative for diarrhea and nausea.  Skin: Negative for rash.  Neurological: Negative for dizziness.       Objective:   Physical Exam Eyes:     General: No scleral icterus. Musculoskeletal:     Comments: Finger really seems resolved, no skin changes.  No concerns.   Neurological:     Mental Status: He is alert.  Psychiatric:        Mood and Affect: Mood normal.           Assessment & Plan:

## 2019-12-15 ENCOUNTER — Other Ambulatory Visit: Payer: Self-pay | Admitting: Internal Medicine

## 2020-01-04 DIAGNOSIS — Z961 Presence of intraocular lens: Secondary | ICD-10-CM | POA: Diagnosis not present

## 2020-01-04 DIAGNOSIS — H43813 Vitreous degeneration, bilateral: Secondary | ICD-10-CM | POA: Diagnosis not present

## 2020-01-04 DIAGNOSIS — H35373 Puckering of macula, bilateral: Secondary | ICD-10-CM | POA: Diagnosis not present

## 2020-01-04 DIAGNOSIS — H401131 Primary open-angle glaucoma, bilateral, mild stage: Secondary | ICD-10-CM | POA: Diagnosis not present

## 2020-01-16 DIAGNOSIS — R05 Cough: Secondary | ICD-10-CM | POA: Diagnosis not present

## 2020-01-20 ENCOUNTER — Other Ambulatory Visit: Payer: Self-pay | Admitting: Internal Medicine

## 2020-01-21 ENCOUNTER — Telehealth (INDEPENDENT_AMBULATORY_CARE_PROVIDER_SITE_OTHER): Payer: Medicare HMO | Admitting: Internal Medicine

## 2020-01-21 ENCOUNTER — Encounter: Payer: Self-pay | Admitting: Internal Medicine

## 2020-01-21 DIAGNOSIS — J0110 Acute frontal sinusitis, unspecified: Secondary | ICD-10-CM | POA: Diagnosis not present

## 2020-01-21 MED ORDER — PROMETHAZINE-DM 6.25-15 MG/5ML PO SYRP: 5.0000 mL | ORAL_SOLUTION | Freq: Four times a day (QID) | ORAL | 0 refills | Status: DC | PRN

## 2020-01-21 MED ORDER — AMOXICILLIN-POT CLAVULANATE 875-125 MG PO TABS: 1.0000 | ORAL_TABLET | Freq: Two times a day (BID) | ORAL | 0 refills | Status: DC

## 2020-01-21 NOTE — Assessment & Plan Note (Signed)
Rx augmentin and promethazine/dm cough syrup.

## 2020-01-21 NOTE — Progress Notes (Signed)
Virtual Visit via Video Note  I connected with Edwin Ramirez on 01/21/20 at  9:20 AM EDT by a video enabled telemedicine application and verified that I am speaking with the correct person using two identifiers.  The patient and the provider were at separate locations throughout the entire encounter. Patient location: home, Provider location: work   I discussed the limitations of evaluation and management by telemedicine and the availability of in person appointments. The patient expressed understanding and agreed to proceed. The patient and the provider were the only parties present for the visit unless noted in HPI below.  History of Present Illness: The patient is a 79 y.o. man with visit for cough and congestion. Started 1-2 weeks. Has taken covid-19 test and this was negative. Took mucinex otc which is not helping. Denies much seasonal allergies. Denies fevers or chills. Overall it is worsening. Also has headache and sinus pressure.   Observations/Objective: Appearance: normal, minimal cough non-productive during visit, breathing appears normal, casual grooming, abdomen does not appear distended, throat with drainage, memory normal, frontal sinuses with pain to self-palpation, mental status is A and O times 3  Assessment and Plan: See problem oriented charting  Follow Up Instructions: rx augmentin and promethazine/dm cough syrup  I discussed the assessment and treatment plan with the patient. The patient was provided an opportunity to ask questions and all were answered. The patient agreed with the plan and demonstrated an understanding of the instructions.   The patient was advised to call back or seek an in-person evaluation if the symptoms worsen or if the condition fails to improve as anticipated.  Hoyt Koch, MD

## 2020-01-26 ENCOUNTER — Ambulatory Visit (INDEPENDENT_AMBULATORY_CARE_PROVIDER_SITE_OTHER): Payer: Medicare HMO

## 2020-01-26 ENCOUNTER — Other Ambulatory Visit: Payer: Self-pay

## 2020-01-26 ENCOUNTER — Ambulatory Visit
Admission: RE | Admit: 2020-01-26 | Discharge: 2020-01-26 | Disposition: A | Discharge status: Home / Self Care | Payer: Medicare HMO | Source: Ambulatory Visit | Attending: Emergency Medicine | Admitting: Emergency Medicine

## 2020-01-26 VITALS — BP 111/67 | HR 75 | Temp 98.6°F | Resp 18

## 2020-01-26 DIAGNOSIS — R05 Cough: Secondary | ICD-10-CM

## 2020-01-26 DIAGNOSIS — R0989 Other specified symptoms and signs involving the circulatory and respiratory systems: Secondary | ICD-10-CM | POA: Diagnosis not present

## 2020-01-26 DIAGNOSIS — R059 Cough, unspecified: Secondary | ICD-10-CM

## 2020-01-26 MED ORDER — BENZONATATE 100 MG PO CAPS: 100.0000 mg | ORAL_CAPSULE | Freq: Three times a day (TID) | ORAL | 0 refills | Status: DC

## 2020-01-26 NOTE — ED Triage Notes (Signed)
Pt here for cough x 10 days; pt had negative covid test 8/8; pt sts pain in right side of back worse with cough; pt finished course of amoxi

## 2020-01-26 NOTE — ED Provider Notes (Signed)
[facility]    CSN: [id number] Arrival date & time: 01/26/20  1344      History   Chief Complaint Chief Complaint  Patient presents with  . Appointment  . Cough    HPI Edwin Ramirez is a 79 y.o. male with extensive medical history as outlined below presenting for 10-day course of cough.  States it was initially productive, though became dry.  Currently taking Augmentin as prescribed by his PCP: Has 3 more days left.  No shortness of breath, chest pain or palpitations, fever.  Underwent Covid testing 8/8: Negative.   Past Medical History:  Diagnosis Date  . Basal cell cancer    Dr Ubaldo Glassing  . DDD (degenerative disc disease)   . Diverticulosis   . Esophageal stricture    Dr Olevia Perches  . Gastritis   . GERD (gastroesophageal reflux disease)   . Gilbert syndrome   . Glaucoma   . Hepatic cyst   . History of prostate cancer    Dr Alinda Money  . Hx of adenomatous colonic polyps   . Hyperlipidemia    has had good cholesterol count for years.  . Internal hemorrhoids   . Pulmonary nodule    not seen on F/U 2014  . TMJ (dislocation of temporomandibular joint)     Patient Active Problem List   Diagnosis Date Noted  . Medication monitoring encounter 08/03/2019  . 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 Eczema of tip of finger 06/13/2019  . Carotid bruit 03/15/2019  . Occipital neuralgia 03/09/2019  . Insomnia 03/09/2019  . Earache on left 01/12/2019  . CAD (coronary artery disease) 07/07/2018  . Rash 01/26/2018  . Chest wall contusion, right, initial encounter 09/25/2017  . Sinusitis 09/03/2017  . Multiple bruises 12/16/2016  . Concussion with no loss of consciousness 12/16/2016  . Abdominal pain 07/08/2016  . Nausea 07/08/2016  . RUQ pain 04/26/2016  . Back pain 04/26/2016  . Low back pain radiating to right lower extremity 03/01/2016  . Sciatic radiculitis 02/13/2016  . Well adult exam 03/01/2015  . Chest wall pain 03/19/2010  .  DECREASED HEARING 01/17/2009  . [place] SYNDROME 05/19/2008  . PULMONARY NODULE 05/19/2008  . HEMORRHOIDS, INTERNAL 05/18/2008  . ESOPHAGEAL STRICTURE 05/18/2008  . DIVERTICULOSIS OF COLON 05/18/2008  . COLONIC POLYPS, ADENOMATOUS, HX OF 05/18/2008  . 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 PROSTATE CANCER, HX OF 01/12/2008  . HEPATIC CYST 12/01/2007  . GERD 09/03/2007  . Dyslipidemia 03/03/2007  . Skin cancer 10/29/2006    Past Surgical History:  Procedure Laterality Date  . APPENDECTOMY    . cataract surgery  2012   bilateral  . CHOLECYSTECTOMY  1985  . COLONOSCOPY W/ POLYPECTOMY  2004   Negative 2009 & 2013 Dr.Brodie  . ESOPHAGEAL DILATION  2004  . MOHS SURGERY     Basal Cell  . PROSTATECTOMY  07/2005   Dr.Borden  . ROTATOR CUFF REPAIR      X 2-Left; Dr Percell Miller       Home Medications    Prior to Admission medications   Medication Sig Start Date End Date Taking? Authorizing Provider  amoxicillin-clavulanate (AUGMENTIN) 875-125 MG tablet Take 1 tablet by mouth 2 (two) times daily. 01/21/20   Hoyt Koch, MD  b complex vitamins tablet Take 1 tablet by mouth daily. 03/09/19   Plotnikov, Evie Lacks, MD  benzonatate (TESSALON) 100 MG capsule Take 1 capsule (100 mg total) by mouth every 8 (eight) hours. 01/26/20  Hall-Potvin, [place], PA-C  Cholecalciferol (VITAMIN D3) 50 MCG (2000 UT) capsule Take 1 capsule (2,000 Units total) by mouth daily. 03/09/19   Plotnikov, Evie Lacks, MD  latanoprost (XALATAN) 0.005 % ophthalmic solution Place 1 drop into both eyes at bedtime.     [provider]  LORazepam (ATIVAN) 1 MG tablet TAKE 1 TABLET (1 MG TOTAL) BY MOUTH EVERY 8 (EIGHT) HOURS AS NEEDED FOR ANXIETY. 12/16/19   Plotnikov, Evie Lacks, MD  omeprazole (PRILOSEC) 40 MG capsule TAKE 1 CAPSULE BY MOUTH TWICE A DAY 01/20/20   Plotnikov, Evie Lacks, MD  polyethylene glycol powder (GLYCOLAX/MIRALAX) powder Take 17 g by mouth daily.     [provider]  promethazine-dextromethorphan (PROMETHAZINE-DM) 6.25-15 MG/5ML syrup Take 5 mLs by mouth 4 (four) times daily as needed for cough. 01/21/20   Hoyt Koch, MD  rifampin (RIFADIN) 300 MG capsule Take 2 capsules (600 mg total) by mouth daily. 09/15/19   Comer, Okey Regal, MD  rosuvastatin (CRESTOR) 10 MG tablet TAKE 1 TABLET BY MOUTH EVERY DAY 09/27/19   Plotnikov, Evie Lacks, MD  silver sulfADIAZINE (SILVADENE) 1 % cream Apply 1 application topically 2 (two) times daily. 06/24/19   Plotnikov, Evie Lacks, MD  Simethicone 125 MG CAPS Take by mouth. Takes one capsule after meals and at bedtime    [provider]  traMADol (ULTRAM) 50 MG tablet Take 1 tablet (50 mg total) by mouth every 6 (six) hours as needed for severe pain. 05/12/18   Plotnikov, Evie Lacks, MD  traZODone (DESYREL) 50 MG tablet Take 1-2 tablets (50-100 mg total) by mouth at bedtime as needed for sleep. 03/09/19   Plotnikov, Evie Lacks, MD  dicyclomine (BENTYL) 10 MG capsule Take 1 a day as needed for spasms Patient not taking: Reported on 01/26/2020 10/08/18 01/26/20  Plotnikov, Evie Lacks, MD    Family History Family History  Problem Relation Age of Onset  . Multiple myeloma Father   . Colon cancer Mother 93  . Heart failure Mother   . Heart attack Maternal Uncle 65  . Diabetes Neg Hx   . Stroke Neg Hx   . Esophageal cancer Neg Hx   . Rectal cancer Neg Hx   . Stomach cancer Neg Hx     Social History Social History   Tobacco Use  . Smoking status: Never Smoker  . Smokeless tobacco: Never Used  Vaping Use  . Vaping Use: Never used  Substance Use Topics  . Alcohol use: Yes    Alcohol/week: 5.0 standard drinks    Types: 5 Glasses of wine per week    Comment: Socially  . Drug use: No     Allergies   Codeine, Nabumetone, Pneumovax [pneumococcal polysaccharide vaccine], and Aspirin   Review of Systems As per HPI   Physical Exam Triage Vital Signs ED Triage Vitals  Enc Vitals Group     BP  01/26/20 1418 111/67     Pulse Rate 01/26/20 1418 75     Resp 01/26/20 1418 18     Temp 01/26/20 1418 98.6 F (37 C)     Temp Source 01/26/20 1418 Oral     SpO2 01/26/20 1418 97 %     Weight --      Height --      Head Circumference --      Peak Flow --      Pain Score 01/26/20 1419 5     Pain Loc --      Pain Edu? --  Excl. in GC? --    No data found.  Updated Vital Signs BP 111/67 (BP Location: Right Arm)   Pulse 75   Temp 98.6 F (37 C) (Oral)   Resp 18   SpO2 97%   Visual Acuity Right Eye Distance:   Left Eye Distance:   Bilateral Distance:    Right Eye Near:   Left Eye Near:    Bilateral Near:     Physical Exam Constitutional:      General: He is not in acute distress.    Appearance: He is not toxic-appearing or diaphoretic.  HENT:     Head: Normocephalic and atraumatic.     Mouth/Throat:     Mouth: Mucous membranes are moist.     Pharynx: Oropharynx is clear.  Eyes:     General: No scleral icterus.    Conjunctiva/sclera: Conjunctivae normal.     Pupils: Pupils are equal, round, and reactive to light.  Neck:     Comments: Trachea midline, negative JVD Cardiovascular:     Rate and Rhythm: Normal rate and regular rhythm.  Pulmonary:     Effort: Pulmonary effort is normal. No respiratory distress.     Breath sounds: No wheezing.  Musculoskeletal:     Cervical back: Neck supple. No tenderness.  Lymphadenopathy:     Cervical: No cervical adenopathy.  Skin:    Capillary Refill: Capillary refill takes less than 2 seconds.     Coloration: Skin is not jaundiced or pale.     Findings: No rash.  Neurological:     Mental Status: He is alert and oriented to person, place, and time.      UC Treatments / Results  Labs (all labs ordered are listed, but only abnormal results are displayed) Labs Reviewed - No data to display  EKG   Radiology DG Chest 2 View  Result Date: 01/26/2020 CLINICAL DATA:  Cough and congestion EXAM: CHEST - 2 VIEW  COMPARISON:  April 26, 2016 FINDINGS: Interstitial thickening remains. No edema or airspace opacity. Heart size and pulmonary vascularity are normal. No adenopathy. There is mild degenerative change in the thoracic spine. IMPRESSION: There is interstitial thickening, stable compared to the 2017 study, likely reflective of a degree of underlying chronic inflammation/mild fibrosis. No edema or airspace opacity. Cardiac silhouette normal. No adenopathy. Electronically Signed   By: Lowella Grip III M.D.   On: 01/26/2020 15:07    Procedures Procedures (including critical care time)  Medications Ordered in UC Medications - No data to display  Initial Impression / Assessment and Plan / UC Course  I have reviewed the triage vital signs and the nursing notes.  Pertinent labs & imaging results that were available during my care of the patient were reviewed by me and considered in my medical decision making (see chart for details).     Patient febrile, nontoxic in office today.  No respiratory distress and cardiopulmonary exam is reassuring.  Given chronicity of cough and antibiotic use, chest x-ray obtained in office.  This reviewed by me and radiology: Stable/chronic interstitial thickening noted without edema, airspace opacity, or cardiomegaly.  Reviewed signs of patient verbalized understanding.  Will trial Tessalon, patient follow-up with pulmonary specialist for further evaluation/management of cough.  Return precautions discussed, pt verbalized understanding and is agreeable to plan. Final Clinical Impressions(s) / UC Diagnoses   Final diagnoses:  Cough     Discharge Instructions     Important to follow up with lung doctor. Tessalon for cough. Start flonase,  atrovent nasal spray for nasal congestion/drainage. You can use over the counter nasal saline rinse such as neti pot for nasal congestion. Keep hydrated, your urine should be clear to pale yellow in color. Tylenol/motrin for fever  and pain. Monitor for any worsening of symptoms, chest pain, shortness of breath, wheezing, swelling of the throat, go to the emergency department for further evaluation needed.     ED Prescriptions    Medication Sig Dispense Auth. Provider   benzonatate (TESSALON) 100 MG capsule Take 1 capsule (100 mg total) by mouth every 8 (eight) hours. 21 capsule Hall-Potvin, [place], PA-C     PDMP not reviewed this encounter.   Hall-Potvin, [place], [place] 01/26/20 1606

## 2020-01-26 NOTE — Discharge Instructions (Addendum)
Important to follow up with lung doctor. Tessalon for cough. Start flonase, atrovent nasal spray for nasal congestion/drainage. You can use over the counter nasal saline rinse such as neti pot for nasal congestion. Keep hydrated, your urine should be clear to pale yellow in color. Tylenol/motrin for fever and pain. Monitor for any worsening of symptoms, chest pain, shortness of breath, wheezing, swelling of the throat, go to the emergency department for further evaluation needed.

## 2020-01-27 ENCOUNTER — Telehealth: Payer: Medicare HMO | Admitting: Internal Medicine

## 2020-02-24 ENCOUNTER — Other Ambulatory Visit: Payer: Self-pay | Admitting: Internal Medicine

## 2020-03-09 ENCOUNTER — Telehealth: Payer: Self-pay | Admitting: Internal Medicine

## 2020-03-09 NOTE — Telephone Encounter (Signed)
Patient called and is getting his Covid Booster on 03/14/2020 and then coming to see Dr. Alain Marion on 03/15/2020 where he wanted to get his flu shot in his appointment. He was also wondering if he could get a Cortizone shot for an old rotator cuff injury. He was wondering if the cortizone shot was compatible with the other two shots and if not he was wondering how long he should wait.    Please call pt back:[phone]

## 2020-03-12 NOTE — Telephone Encounter (Signed)
It is best to delay a flu shot and a cortisone injection for 3 weeks after his Covid booster.  Thanks

## 2020-03-14 ENCOUNTER — Ambulatory Visit: Payer: Medicare HMO | Attending: Internal Medicine

## 2020-03-14 DIAGNOSIS — Z23 Encounter for immunization: Secondary | ICD-10-CM

## 2020-03-14 NOTE — Progress Notes (Signed)
   Covid-19 Vaccination Clinic  Name:  Edwin Ramirez    MRN: [id number] DOB: Sep 14, 1940  03/14/2020  Edwin Ramirez was observed post Covid-19 immunization for 15 minutes without incident. He was provided with Vaccine Information Sheet and instruction to access the V-Safe system.   Edwin Ramirez was instructed to call 911 with any severe reactions post vaccine: Marland Kitchen Difficulty breathing  . Swelling of face and throat  . A fast heartbeat  . A bad rash all over body  . Dizziness and weakness

## 2020-03-15 ENCOUNTER — Ambulatory Visit (INDEPENDENT_AMBULATORY_CARE_PROVIDER_SITE_OTHER): Payer: Medicare HMO | Admitting: Internal Medicine

## 2020-03-15 ENCOUNTER — Encounter: Payer: Self-pay | Admitting: Internal Medicine

## 2020-03-15 ENCOUNTER — Other Ambulatory Visit: Payer: Self-pay

## 2020-03-15 VITALS — BP 120/58 | HR 55 | Temp 97.6°F | Ht 67.0 in | Wt 159.0 lb

## 2020-03-15 DIAGNOSIS — Z0000 Encounter for general adult medical examination without abnormal findings: Secondary | ICD-10-CM

## 2020-03-15 DIAGNOSIS — G8929 Other chronic pain: Secondary | ICD-10-CM | POA: Diagnosis not present

## 2020-03-15 DIAGNOSIS — E785 Hyperlipidemia, unspecified: Secondary | ICD-10-CM

## 2020-03-15 DIAGNOSIS — K219 Gastro-esophageal reflux disease without esophagitis: Secondary | ICD-10-CM

## 2020-03-15 DIAGNOSIS — Z8546 Personal history of malignant neoplasm of prostate: Secondary | ICD-10-CM | POA: Diagnosis not present

## 2020-03-15 DIAGNOSIS — M25512 Pain in left shoulder: Secondary | ICD-10-CM | POA: Diagnosis not present

## 2020-03-15 DIAGNOSIS — M25519 Pain in unspecified shoulder: Secondary | ICD-10-CM | POA: Insufficient documentation

## 2020-03-15 DIAGNOSIS — I2510 Atherosclerotic heart disease of native coronary artery without angina pectoris: Secondary | ICD-10-CM | POA: Diagnosis not present

## 2020-03-15 DIAGNOSIS — I2583 Coronary atherosclerosis due to lipid rich plaque: Secondary | ICD-10-CM

## 2020-03-15 LAB — COMPREHENSIVE METABOLIC PANEL
ALT: 16 U/L (ref 0–53)
AST: 19 U/L (ref 0–37)
Albumin: 4.2 g/dL (ref 3.5–5.2)
Alkaline Phosphatase: 54 U/L (ref 39–117)
BUN: 17 mg/dL (ref 6–23)
CO2: 29 mEq/L (ref 19–32)
Calcium: 9.1 mg/dL (ref 8.4–10.5)
Chloride: 107 mEq/L (ref 96–112)
Creatinine, Ser: 1.16 mg/dL (ref 0.40–1.50)
GFR: 58.56 mL/min — ABNORMAL LOW (ref >60.00)
Glucose, Bld: 90 mg/dL (ref 70–99)
Potassium: 4.3 mEq/L (ref 3.5–5.1)
Sodium: 141 mEq/L (ref 135–145)
Total Bilirubin: 1.1 mg/dL (ref 0.2–1.2)
Total Protein: 6.3 g/dL (ref 6.0–8.3)

## 2020-03-15 LAB — URINALYSIS
Bilirubin Urine: NEGATIVE
Hgb urine dipstick: NEGATIVE
Ketones, ur: NEGATIVE
Leukocytes,Ua: NEGATIVE
Nitrite: NEGATIVE
Specific Gravity, Urine: 1.025 (ref 1.000–1.030)
Total Protein, Urine: NEGATIVE
Urine Glucose: NEGATIVE
Urobilinogen, UA: 0.2 (ref 0.0–1.0)
pH: 5.5 (ref 5.0–8.0)

## 2020-03-15 LAB — CBC WITH DIFFERENTIAL/PLATELET
Basophils Absolute: 0 10*3/uL (ref 0.0–0.1)
Basophils Relative: 0.5 % (ref 0.0–3.0)
Eosinophils Absolute: 0.1 10*3/uL (ref 0.0–0.7)
Eosinophils Relative: 2.3 % (ref 0.0–5.0)
HCT: 38.8 % — ABNORMAL LOW (ref 39.0–52.0)
Hemoglobin: 13.2 g/dL (ref 13.0–17.0)
Lymphocytes Relative: 21.1 % (ref 12.0–46.0)
Lymphs Abs: 1.1 10*3/uL (ref 0.7–4.0)
MCHC: 34 g/dL (ref 30.0–36.0)
MCV: 98.2 fl (ref 78.0–100.0)
Monocytes Absolute: 0.6 10*3/uL (ref 0.1–1.0)
Monocytes Relative: 11 % (ref 3.0–12.0)
Neutro Abs: 3.5 10*3/uL (ref 1.4–7.7)
Neutrophils Relative %: 65.1 % (ref 43.0–77.0)
Platelets: 134 10*3/uL — ABNORMAL LOW (ref 150.0–400.0)
RBC: 3.95 Mil/uL — ABNORMAL LOW (ref 4.22–5.81)
RDW: 13.2 % (ref 11.5–15.5)
WBC: 5.3 10*3/uL (ref 4.0–10.5)

## 2020-03-15 LAB — LIPID PANEL
Cholesterol: 130 mg/dL (ref 0–200)
HDL: 38.7 mg/dL — ABNORMAL LOW (ref >39.00)
LDL Cholesterol: 63 mg/dL (ref 0–99)
NonHDL: 90.91
Total CHOL/HDL Ratio: 3
Triglycerides: 142 mg/dL (ref 0.0–149.0)
VLDL: 28.4 mg/dL (ref 0.0–40.0)

## 2020-03-15 LAB — TSH: TSH: 3.05 u[IU]/mL (ref 0.35–4.50)

## 2020-03-15 LAB — PSA: PSA: 0 ng/mL — ABNORMAL LOW (ref 0.10–4.00)

## 2020-03-15 MED ORDER — OMEPRAZOLE 40 MG PO CPDR
40.0000 mg | DELAYED_RELEASE_CAPSULE | Freq: Two times a day (BID) | ORAL | 3 refills | Status: DC
Start: 2020-03-15 — End: 2021-03-26

## 2020-03-15 MED ORDER — DICYCLOMINE HCL 10 MG PO CAPS: 10.0000 mg | ORAL_CAPSULE | Freq: Three times a day (TID) | ORAL | 3 refills | Status: DC

## 2020-03-15 NOTE — Assessment & Plan Note (Signed)
PSA

## 2020-03-15 NOTE — Progress Notes (Signed)
Subjective:  Patient ID: Edwin Ramirez, male    DOB: May 19, 1941  Age: 79 y.o. MRN: [id number]  CC: Annual Exam   HPI SHAYE LAGACE presents for a well exam C/o R shoulder pain F/u GERD, CAD  Outpatient Medications Prior to Visit  Medication Sig Dispense Refill  . b complex vitamins tablet Take 1 tablet by mouth daily. 100 tablet 3  . benzonatate (TESSALON) 100 MG capsule Take 1 capsule (100 mg total) by mouth every 8 (eight) hours. 21 capsule 0  . Cholecalciferol (VITAMIN D3) 50 MCG (2000 UT) capsule Take 1 capsule (2,000 Units total) by mouth daily. 100 capsule 3  . latanoprost (XALATAN) 0.005 % ophthalmic solution Place 1 drop into both eyes at bedtime.     Marland Kitchen LORazepam (ATIVAN) 1 MG tablet TAKE 1 TABLET (1 MG TOTAL) BY MOUTH EVERY 8 (EIGHT) HOURS AS NEEDED FOR ANXIETY. 90 tablet 1  . omeprazole (PRILOSEC) 40 MG capsule TAKE 1 CAPSULE BY MOUTH TWICE A DAY 180 capsule 3  . polyethylene glycol powder (GLYCOLAX/MIRALAX) powder Take 17 g by mouth daily.     . promethazine-dextromethorphan (PROMETHAZINE-DM) 6.25-15 MG/5ML syrup Take 5 mLs by mouth 4 (four) times daily as needed for cough. 118 mL 0  . rosuvastatin (CRESTOR) 10 MG tablet TAKE 1 TABLET BY MOUTH EVERY DAY 90 tablet 3  . silver sulfADIAZINE (SILVADENE) 1 % cream Apply 1 application topically 2 (two) times daily. 25 g 1  . Simethicone 125 MG CAPS Take by mouth. Takes one capsule after meals and at bedtime    . traMADol (ULTRAM) 50 MG tablet Take 1 tablet (50 mg total) by mouth every 6 (six) hours as needed for severe pain. 60 tablet 0  . traZODone (DESYREL) 50 MG tablet TAKE 1-2 TABLETS (50-100 MG TOTAL) BY MOUTH AT BEDTIME AS NEEDED FOR SLEEP. 60 tablet 5  . amoxicillin-clavulanate (AUGMENTIN) 875-125 MG tablet Take 1 tablet by mouth 2 (two) times daily. (Patient not taking: Reported on 03/15/2020) 20 tablet 0  . rifampin (RIFADIN) 300 MG capsule Take 2 capsules (600 mg total) by mouth daily. (Patient not taking:  Reported on 03/15/2020) 60 capsule 2   No facility-administered medications prior to visit.    ROS: Review of Systems  Constitutional: Negative for appetite change, fatigue and unexpected weight change.  HENT: Negative for congestion, nosebleeds, sneezing, sore throat and trouble swallowing.   Eyes: Negative for itching and visual disturbance.  Respiratory: Negative for cough.   Cardiovascular: Negative for chest pain, palpitations and leg swelling.  Gastrointestinal: Negative for abdominal distention, blood in stool, diarrhea and nausea.  Genitourinary: Negative for frequency and hematuria.  Musculoskeletal: Positive for arthralgias. Negative for back pain, gait problem, joint swelling and neck pain.  Skin: Negative for rash.  Neurological: Negative for dizziness, tremors, speech difficulty and weakness.  Psychiatric/Behavioral: Negative for agitation, dysphoric mood, sleep disturbance and suicidal ideas. The patient is not nervous/anxious.     Objective:  BP (!) 120/58 (BP Location: Left Arm, Patient Position: Sitting, Cuff Size: Normal)   Pulse (!) 55   Temp 97.6 F (36.4 C) (Oral)   Ht 5\' 7"  (1.702 m)   Wt 159 lb (72.1 kg)   SpO2 97%   BMI 24.90 kg/m   BP Readings from Last 3 Encounters:  03/15/20 (!) 120/58  01/26/20 111/67  11/22/19 119/67    Wt Readings from Last 3 Encounters:  03/15/20 159 lb (72.1 kg)  11/22/19 156 lb (70.8 kg)  10/13/19 154 lb (69.9  kg)    Physical Exam Constitutional:      General: He is not in acute distress.    Appearance: He is well-developed.     Comments: NAD  Eyes:     Conjunctiva/sclera: Conjunctivae normal.     Pupils: Pupils are equal, round, and reactive to light.  Neck:     Thyroid: No thyromegaly.     Vascular: No JVD.  Cardiovascular:     Rate and Rhythm: Normal rate and regular rhythm.     Heart sounds: Normal heart sounds. No murmur heard.  No friction rub. No gallop.   Pulmonary:     Effort: Pulmonary effort is  normal. No respiratory distress.     Breath sounds: Normal breath sounds. No wheezing or rales.  Chest:     Chest wall: No tenderness.  Abdominal:     General: Bowel sounds are normal. There is no distension.     Palpations: Abdomen is soft. There is no mass.     Tenderness: There is no abdominal tenderness. There is no guarding or rebound.  Musculoskeletal:        General: Tenderness present. Normal range of motion.     Cervical back: Normal range of motion.  Lymphadenopathy:     Cervical: No cervical adenopathy.  Skin:    General: Skin is warm and dry.     Findings: No rash.  Neurological:     Mental Status: He is alert and oriented to person, place, and time.     Cranial Nerves: No cranial nerve deficit.     Motor: No abnormal muscle tone.     Coordination: Coordination normal.     Gait: Gait normal.     Deep Tendon Reflexes: Reflexes are normal and symmetric.  Psychiatric:        Behavior: Behavior normal.        Thought Content: Thought content normal.        Judgment: Judgment normal.   L shoulder is painful w/ROM  I spent 22 minutes in addition to time for CPX wellness examination in preparing to see the patient by review of recent labs, imaging and procedures, obtaining and reviewing separately obtained history, communicating with the patient, ordering medications, tests or procedures, and documenting clinical information in the EHR including the differential diagnosis, treatment, and any further evaluation and other management of shoulder pain, GERD, CAD...      Assessment & Plan Note by      Lab Results  Component Value Date   WBC 7.3 03/15/2019   HGB 14.9 03/15/2019   HCT 44.8 03/15/2019   PLT 172.0 03/15/2019   GLUCOSE 113 (H) 11/22/2019   CHOL 143 03/15/2019   TRIG 129.0 03/15/2019   HDL 45.50 03/15/2019   LDLDIRECT 78.8 02/05/2011   LDLCALC 71 03/15/2019   ALT 16 11/22/2019   AST 20 11/22/2019   NA 140 11/22/2019   K 4.2 11/22/2019   CL 107  11/22/2019   CREATININE 1.00 11/22/2019   BUN 16 11/22/2019   CO2 28 11/22/2019   TSH 2.02 03/15/2019   PSA 0.00 (L) 03/15/2019   HGBA1C 5.7 03/07/2014    DG Chest 2 View  Result Date: 01/26/2020 CLINICAL DATA:  Cough and congestion EXAM: CHEST - 2 VIEW COMPARISON:  April 26, 2016 FINDINGS: Interstitial thickening remains. No edema or airspace opacity. Heart size and pulmonary vascularity are normal. No adenopathy. There is mild degenerative change in the thoracic spine. IMPRESSION: There is interstitial thickening, stable compared to the  2017 study, likely reflective of a degree of underlying chronic inflammation/mild fibrosis. No edema or airspace opacity. Cardiac silhouette normal. No adenopathy. Electronically Signed   By: Lowella Grip III M.D.   On: 01/26/2020 15:07    Assessment & Plan:    Walker Kehr, MD

## 2020-03-15 NOTE — Assessment & Plan Note (Signed)

## 2020-03-15 NOTE — Assessment & Plan Note (Signed)
On Prilosec

## 2020-03-15 NOTE — Assessment & Plan Note (Signed)
Crestor Try EC baby ASA if tolerated

## 2020-03-15 NOTE — Assessment & Plan Note (Signed)
Chronic L - worse We can inject later if needed

## 2020-03-30 DIAGNOSIS — Z20822 Contact with and (suspected) exposure to covid-19: Secondary | ICD-10-CM | POA: Diagnosis not present

## 2020-04-04 DIAGNOSIS — S46012A Strain of muscle(s) and tendon(s) of the rotator cuff of left shoulder, initial encounter: Secondary | ICD-10-CM | POA: Diagnosis not present

## 2020-04-10 DIAGNOSIS — R69 Illness, unspecified: Secondary | ICD-10-CM | POA: Diagnosis not present

## 2020-04-14 ENCOUNTER — Ambulatory Visit: Payer: Medicare HMO

## 2020-04-26 DIAGNOSIS — R69 Illness, unspecified: Secondary | ICD-10-CM | POA: Diagnosis not present

## 2020-05-10 ENCOUNTER — Other Ambulatory Visit: Payer: Self-pay

## 2020-05-10 ENCOUNTER — Ambulatory Visit (INDEPENDENT_AMBULATORY_CARE_PROVIDER_SITE_OTHER): Payer: Medicare HMO | Admitting: Internal Medicine

## 2020-05-10 ENCOUNTER — Ambulatory Visit (INDEPENDENT_AMBULATORY_CARE_PROVIDER_SITE_OTHER): Payer: Medicare HMO

## 2020-05-10 ENCOUNTER — Encounter: Payer: Self-pay | Admitting: Internal Medicine

## 2020-05-10 DIAGNOSIS — H35373 Puckering of macula, bilateral: Secondary | ICD-10-CM | POA: Diagnosis not present

## 2020-05-10 DIAGNOSIS — M5430 Sciatica, unspecified side: Secondary | ICD-10-CM | POA: Insufficient documentation

## 2020-05-10 DIAGNOSIS — H401131 Primary open-angle glaucoma, bilateral, mild stage: Secondary | ICD-10-CM | POA: Diagnosis not present

## 2020-05-10 DIAGNOSIS — M545 Low back pain, unspecified: Secondary | ICD-10-CM | POA: Diagnosis not present

## 2020-05-10 DIAGNOSIS — M5432 Sciatica, left side: Secondary | ICD-10-CM

## 2020-05-10 DIAGNOSIS — Z961 Presence of intraocular lens: Secondary | ICD-10-CM | POA: Diagnosis not present

## 2020-05-10 DIAGNOSIS — H43813 Vitreous degeneration, bilateral: Secondary | ICD-10-CM | POA: Diagnosis not present

## 2020-05-10 MED ORDER — TRAMADOL HCL 50 MG PO TABS
50.0000 mg | ORAL_TABLET | Freq: Four times a day (QID) | ORAL | 0 refills | Status: AC | PRN
Start: 2020-05-10 — End: ?

## 2020-05-10 MED ORDER — PREDNISONE 10 MG PO TABS: ORAL_TABLET | ORAL | 1 refills | Status: DC

## 2020-05-10 MED ORDER — LORAZEPAM 1 MG PO TABS: 1.0000 mg | ORAL_TABLET | Freq: Three times a day (TID) | ORAL | 1 refills | Status: DC | PRN

## 2020-05-10 NOTE — Assessment & Plan Note (Addendum)
L side - severe Prednisone 10 mg: take 4 tabs a day x 3 days; then 3 tabs a day x 4 days; then 2 tabs a day x 4 days, then 1 tab a day x 6 days, then stop. Take pc. Tramadol prn  Potential benefits of a short term steroid and opioid  use as well as potential risks  and complications were explained to the patient and were aknowledged.

## 2020-05-10 NOTE — Progress Notes (Signed)
Subjective:  Patient ID: Edwin Ramirez, male    DOB: [birth year]  Age: 79 y.o. MRN: [id number]  CC: Back Pain and Hip Pain ((L) side)   HPI AKAASH VANDEWATER presents for severe  L LBP irrad to the L leg x 2 weeks The pain is worse w/laying down, can't sleep  Outpatient Medications Prior to Visit  Medication Sig Dispense Refill  . b complex vitamins tablet Take 1 tablet by mouth daily. 100 tablet 3  . Cholecalciferol (VITAMIN D3) 50 MCG (2000 UT) capsule Take 1 capsule (2,000 Units total) by mouth daily. 100 capsule 3  . dicyclomine (BENTYL) 10 MG capsule Take 1 capsule (10 mg total) by mouth 4 (four) times daily -  before meals and at bedtime. 100 capsule 3  . latanoprost (XALATAN) 0.005 % ophthalmic solution Place 1 drop into both eyes at bedtime.     Marland Kitchen LORazepam (ATIVAN) 1 MG tablet TAKE 1 TABLET (1 MG TOTAL) BY MOUTH EVERY 8 (EIGHT) HOURS AS NEEDED FOR ANXIETY. 90 tablet 1  . omeprazole (PRILOSEC) 40 MG capsule Take 1 capsule (40 mg total) by mouth 2 (two) times daily. 180 capsule 3  . rosuvastatin (CRESTOR) 10 MG tablet TAKE 1 TABLET BY MOUTH EVERY DAY 90 tablet 3  . traZODone (DESYREL) 50 MG tablet TAKE 1-2 TABLETS (50-100 MG TOTAL) BY MOUTH AT BEDTIME AS NEEDED FOR SLEEP. 60 tablet 5  . traMADol (ULTRAM) 50 MG tablet Take 1 tablet (50 mg total) by mouth every 6 (six) hours as needed for severe pain. (Patient not taking: Reported on 05/10/2020) 60 tablet 0  . benzonatate (TESSALON) 100 MG capsule Take 1 capsule (100 mg total) by mouth every 8 (eight) hours. (Patient not taking: Reported on 05/10/2020) 21 capsule 0  . polyethylene glycol powder (GLYCOLAX/MIRALAX) powder Take 17 g by mouth daily.  (Patient not taking: Reported on 05/10/2020)    . promethazine-dextromethorphan (PROMETHAZINE-DM) 6.25-15 MG/5ML syrup Take 5 mLs by mouth 4 (four) times daily as needed for cough. (Patient not taking: Reported on 05/10/2020) 118 mL 0  . silver sulfADIAZINE (SILVADENE) 1 % cream Apply 1  application topically 2 (two) times daily. (Patient not taking: Reported on 05/10/2020) 25 g 1  . Simethicone 125 MG CAPS Take by mouth. Takes one capsule after meals and at bedtime (Patient not taking: Reported on 05/10/2020)     No facility-administered medications prior to visit.    ROS: Review of Systems  Constitutional: Negative for appetite change, fatigue and unexpected weight change.  HENT: Negative for congestion, nosebleeds, sneezing, sore throat and trouble swallowing.   Eyes: Negative for itching and visual disturbance.  Respiratory: Negative for cough.   Cardiovascular: Negative for chest pain, palpitations and leg swelling.  Gastrointestinal: Negative for abdominal distention, blood in stool, diarrhea and nausea.  Genitourinary: Negative for frequency and hematuria.  Musculoskeletal: Positive for back pain and gait problem. Negative for joint swelling and neck pain.  Skin: Negative for rash.  Neurological: Negative for dizziness, tremors, speech difficulty and weakness.  Psychiatric/Behavioral: Positive for sleep disturbance. Negative for agitation and dysphoric mood. The patient is not nervous/anxious.     Objective:  BP 130/60 (BP Location: Left Arm)   Pulse 79   Temp 97.7 F (36.5 C) (Oral)   Wt 155 lb (70.3 kg)   SpO2 94%   BMI 24.28 kg/m   BP Readings from Last 3 Encounters:  05/10/20 130/60  03/15/20 (!) 120/58  01/26/20 111/67    Wt Readings from Last  3 Encounters:  05/10/20 155 lb (70.3 kg)  03/15/20 159 lb (72.1 kg)  11/22/19 156 lb (70.8 kg)    Physical Exam Constitutional:      General: He is not in acute distress.    Appearance: He is well-developed.     Comments: NAD  Eyes:     Conjunctiva/sclera: Conjunctivae normal.     Pupils: Pupils are equal, round, and reactive to light.  Neck:     Thyroid: No thyromegaly.     Vascular: No JVD.  Cardiovascular:     Rate and Rhythm: Normal rate and regular rhythm.     Heart sounds: Normal heart  sounds. No murmur heard.  No friction rub. No gallop.   Pulmonary:     Effort: Pulmonary effort is normal. No respiratory distress.     Breath sounds: Normal breath sounds. No wheezing or rales.  Chest:     Chest wall: No tenderness.  Abdominal:     General: Bowel sounds are normal. There is no distension.     Palpations: Abdomen is soft. There is no mass.     Tenderness: There is no abdominal tenderness. There is no guarding or rebound.  Musculoskeletal:        General: Tenderness present. Normal range of motion.     Cervical back: Normal range of motion.  Lymphadenopathy:     Cervical: No cervical adenopathy.  Skin:    General: Skin is warm and dry.     Findings: No rash.  Neurological:     Mental Status: He is alert and oriented to person, place, and time.     Cranial Nerves: No cranial nerve deficit.     Motor: No abnormal muscle tone.     Coordination: Coordination normal.     Gait: Gait normal.     Deep Tendon Reflexes: Reflexes are normal and symmetric.  Psychiatric:        Behavior: Behavior normal.        Thought Content: Thought content normal.        Judgment: Judgment normal.    LS tender Str leg elev (-) B   Lab Results  Component Value Date   WBC 5.3 03/15/2020   HGB 13.2 03/15/2020   HCT 38.8 (L) 03/15/2020   PLT 134.0 (L) 03/15/2020   GLUCOSE 90 03/15/2020   CHOL 130 03/15/2020   TRIG 142.0 03/15/2020   HDL 38.70 (L) 03/15/2020   LDLDIRECT 78.8 02/05/2011   LDLCALC 63 03/15/2020   ALT 16 03/15/2020   AST 19 03/15/2020   NA 141 03/15/2020   K 4.3 03/15/2020   CL 107 03/15/2020   CREATININE 1.16 03/15/2020   BUN 17 03/15/2020   CO2 29 03/15/2020   TSH 3.05 03/15/2020   PSA 0.00 (L) 03/15/2020   HGBA1C 5.7 03/07/2014    DG Chest 2 View  Result Date: 01/26/2020 CLINICAL DATA:  Cough and congestion EXAM: CHEST - 2 VIEW COMPARISON:  April 26, 2016 FINDINGS: Interstitial thickening remains. No edema or airspace opacity. Heart size and  pulmonary vascularity are normal. No adenopathy. There is mild degenerative change in the thoracic spine. IMPRESSION: There is interstitial thickening, stable compared to the 2017 study, likely reflective of a degree of underlying chronic inflammation/mild fibrosis. No edema or airspace opacity. Cardiac silhouette normal. No adenopathy. Electronically Signed   By: Lowella Grip III M.D.   On: 01/26/2020 15:07    Assessment & Plan:    Walker Kehr, MD

## 2020-06-28 DIAGNOSIS — U071 COVID-19: Secondary | ICD-10-CM | POA: Diagnosis not present

## 2020-06-28 DIAGNOSIS — Z6822 Body mass index (BMI) 22.0-22.9, adult: Secondary | ICD-10-CM | POA: Diagnosis not present

## 2020-08-01 ENCOUNTER — Encounter: Payer: Self-pay | Admitting: Internal Medicine

## 2020-08-01 ENCOUNTER — Other Ambulatory Visit: Payer: Self-pay

## 2020-08-01 ENCOUNTER — Ambulatory Visit (INDEPENDENT_AMBULATORY_CARE_PROVIDER_SITE_OTHER): Payer: Medicare HMO

## 2020-08-01 ENCOUNTER — Ambulatory Visit (INDEPENDENT_AMBULATORY_CARE_PROVIDER_SITE_OTHER): Payer: Medicare HMO | Admitting: Internal Medicine

## 2020-08-01 DIAGNOSIS — R079 Chest pain, unspecified: Secondary | ICD-10-CM | POA: Diagnosis not present

## 2020-08-01 DIAGNOSIS — M5440 Lumbago with sciatica, unspecified side: Secondary | ICD-10-CM | POA: Diagnosis not present

## 2020-08-01 DIAGNOSIS — R1011 Right upper quadrant pain: Secondary | ICD-10-CM

## 2020-08-01 DIAGNOSIS — U071 COVID-19: Secondary | ICD-10-CM

## 2020-08-01 DIAGNOSIS — R059 Cough, unspecified: Secondary | ICD-10-CM | POA: Diagnosis not present

## 2020-08-01 LAB — CBC WITH DIFFERENTIAL/PLATELET
Basophils Absolute: 0 K/uL (ref 0.0–0.1)
Basophils Relative: 0.9 % (ref 0.0–3.0)
Eosinophils Absolute: 0.1 K/uL (ref 0.0–0.7)
Eosinophils Relative: 2.5 % (ref 0.0–5.0)
HCT: 38 % — ABNORMAL LOW (ref 39.0–52.0)
Hemoglobin: 12.8 g/dL — ABNORMAL LOW (ref 13.0–17.0)
Lymphocytes Relative: 22.6 % (ref 12.0–46.0)
Lymphs Abs: 1 K/uL (ref 0.7–4.0)
MCHC: 33.6 g/dL (ref 30.0–36.0)
MCV: 97.8 fl (ref 78.0–100.0)
Monocytes Absolute: 0.4 K/uL (ref 0.1–1.0)
Monocytes Relative: 9.1 % (ref 3.0–12.0)
Neutro Abs: 2.7 K/uL (ref 1.4–7.7)
Neutrophils Relative %: 64.9 % (ref 43.0–77.0)
Platelets: 121 K/uL — ABNORMAL LOW (ref 150.0–400.0)
RBC: 3.88 Mil/uL — ABNORMAL LOW (ref 4.22–5.81)
RDW: 13.6 % (ref 11.5–15.5)
WBC: 4.2 K/uL (ref 4.0–10.5)

## 2020-08-01 LAB — URINALYSIS
Bilirubin Urine: NEGATIVE
Hgb urine dipstick: NEGATIVE
Ketones, ur: NEGATIVE
Leukocytes,Ua: NEGATIVE
Nitrite: NEGATIVE
Specific Gravity, Urine: >=1.03 — AB (ref 1.000–1.030)
Total Protein, Urine: NEGATIVE
Urine Glucose: NEGATIVE
Urobilinogen, UA: 0.2 (ref 0.0–1.0)
pH: 5.5 (ref 5.0–8.0)

## 2020-08-01 LAB — COMPREHENSIVE METABOLIC PANEL
ALT: 15 U/L (ref 0–53)
AST: 19 U/L (ref 0–37)
Albumin: 4 g/dL (ref 3.5–5.2)
Alkaline Phosphatase: 61 U/L (ref 39–117)
BUN: 12 mg/dL (ref 6–23)
CO2: 27 mEq/L (ref 19–32)
Calcium: 8.8 mg/dL (ref 8.4–10.5)
Chloride: 107 mEq/L (ref 96–112)
Creatinine, Ser: 0.96 mg/dL (ref 0.40–1.50)
GFR: 73.98 mL/min (ref >60.00)
Glucose, Bld: 117 mg/dL — ABNORMAL HIGH (ref 70–99)
Potassium: 4.2 mEq/L (ref 3.5–5.1)
Sodium: 140 mEq/L (ref 135–145)
Total Bilirubin: 0.8 mg/dL (ref 0.2–1.2)
Total Protein: 6.2 g/dL (ref 6.0–8.3)

## 2020-08-01 MED ORDER — BENZONATATE 200 MG PO CAPS: 200.0000 mg | ORAL_CAPSULE | Freq: Two times a day (BID) | ORAL | 0 refills | Status: DC | PRN

## 2020-08-01 MED ORDER — HYDROCODONE-ACETAMINOPHEN 5-325 MG PO TABS
0.5000 | ORAL_TABLET | Freq: Four times a day (QID) | ORAL | 0 refills | Status: AC | PRN
Start: 2020-08-01 — End: 2021-08-01

## 2020-08-01 NOTE — Addendum Note (Signed)
Addended by: Raliegh Ip on: 08/01/2020 10:19 AM   Modules accepted: Orders

## 2020-08-01 NOTE — Assessment & Plan Note (Signed)
R sciatica CXR

## 2020-08-01 NOTE — Progress Notes (Signed)
Subjective:  Patient ID: Edwin Ramirez, male    DOB: Feb 27, 1941  Age: 80 y.o. MRN: [id number]  CC: Follow-up (Post covid )   HPI Edwin Ramirez presents for severe back on the R - irrad to the RUQ - new and worse: 7-8/10 Pt had COVID in Jan 2022, coughed a lot  No n/v   Outpatient Medications Prior to Visit  Medication Sig Dispense Refill  . b complex vitamins tablet Take 1 tablet by mouth daily. 100 tablet 3  . Cholecalciferol (VITAMIN D3) 50 MCG (2000 UT) capsule Take 1 capsule (2,000 Units total) by mouth daily. 100 capsule 3  . dicyclomine (BENTYL) 10 MG capsule Take 1 capsule (10 mg total) by mouth 4 (four) times daily -  before meals and at bedtime. 100 capsule 3  . latanoprost (XALATAN) 0.005 % ophthalmic solution Place 1 drop into both eyes at bedtime.    Marland Kitchen LORazepam (ATIVAN) 1 MG tablet Take 1 tablet (1 mg total) by mouth every 8 (eight) hours as needed for anxiety. 90 tablet 1  . omeprazole (PRILOSEC) 40 MG capsule Take 1 capsule (40 mg total) by mouth 2 (two) times daily. 180 capsule 3  . rosuvastatin (CRESTOR) 10 MG tablet TAKE 1 TABLET BY MOUTH EVERY DAY 90 tablet 3  . traMADol (ULTRAM) 50 MG tablet Take 1 tablet (50 mg total) by mouth every 6 (six) hours as needed for severe pain. 20 tablet 0  . traZODone (DESYREL) 50 MG tablet TAKE 1-2 TABLETS (50-100 MG TOTAL) BY MOUTH AT BEDTIME AS NEEDED FOR SLEEP. 60 tablet 5  . predniSONE (DELTASONE) 10 MG tablet Prednisone 10 mg: take 4 tabs a day x 3 days; then 3 tabs a day x 4 days; then 2 tabs a day x 4 days, then 1 tab a day x 6 days, then stop. Take pc. (Patient not taking: Reported on 08/01/2020) 38 tablet 1   No facility-administered medications prior to visit.    ROS: Review of Systems  Constitutional: Negative for appetite change, fatigue and unexpected weight change.  HENT: Negative for congestion, nosebleeds, sneezing, sore throat and trouble swallowing.   Eyes: Negative for itching and visual disturbance.   Respiratory: Negative for cough.   Cardiovascular: Negative for chest pain, palpitations and leg swelling.  Gastrointestinal: Positive for abdominal pain. Negative for abdominal distention, blood in stool, diarrhea and nausea.  Genitourinary: Negative for frequency and hematuria.  Musculoskeletal: Positive for back pain. Negative for gait problem, joint swelling and neck pain.  Skin: Negative for rash.  Neurological: Negative for dizziness, tremors, speech difficulty and weakness.  Psychiatric/Behavioral: Negative for agitation, dysphoric mood, sleep disturbance and suicidal ideas. The patient is not nervous/anxious.     Objective:  BP 122/60 (BP Location: Left Arm)   Pulse 62   Temp 97.9 F (36.6 C) (Oral)   Ht 5\' 7"  (1.702 m)   Wt 156 lb 9.6 oz (71 kg)   SpO2 96%   BMI 24.53 kg/m   BP Readings from Last 3 Encounters:  08/01/20 122/60  05/10/20 130/60  03/15/20 (!) 120/58    Wt Readings from Last 3 Encounters:  08/01/20 156 lb 9.6 oz (71 kg)  05/10/20 155 lb (70.3 kg)  03/15/20 159 lb (72.1 kg)    Physical Exam Constitutional:      General: He is not in acute distress.    Appearance: He is well-developed.     Comments: NAD  HENT:     Mouth/Throat:  Mouth: Oropharynx is clear and moist.  Eyes:     Conjunctiva/sclera: Conjunctivae normal.     Pupils: Pupils are equal, round, and reactive to light.  Neck:     Thyroid: No thyromegaly.     Vascular: No JVD.  Cardiovascular:     Rate and Rhythm: Normal rate and regular rhythm.     Pulses: Intact distal pulses.     Heart sounds: Normal heart sounds. No murmur heard. No friction rub. No gallop.   Pulmonary:     Effort: Pulmonary effort is normal. No respiratory distress.     Breath sounds: Normal breath sounds. No wheezing or rales.  Chest:     Chest wall: No tenderness.  Abdominal:     General: Bowel sounds are normal. There is no distension.     Palpations: Abdomen is soft. There is no mass.     Tenderness:  There is no abdominal tenderness. There is no guarding or rebound.  Musculoskeletal:        General: No tenderness or edema. Normal range of motion.     Cervical back: Normal range of motion.  Lymphadenopathy:     Cervical: No cervical adenopathy.  Skin:    General: Skin is warm and dry.     Findings: No rash.  Neurological:     Mental Status: He is alert and oriented to person, place, and time.     Cranial Nerves: No cranial nerve deficit.     Motor: No abnormal muscle tone.     Coordination: He displays a negative Romberg sign. Coordination normal.     Gait: Gait normal.     Deep Tendon Reflexes: Reflexes are normal and symmetric.  Psychiatric:        Mood and Affect: Mood and affect normal.        Behavior: Behavior normal.        Thought Content: Thought content normal.        Judgment: Judgment normal.     Lab Results  Component Value Date   WBC 5.3 03/15/2020   HGB 13.2 03/15/2020   HCT 38.8 (L) 03/15/2020   PLT 134.0 (L) 03/15/2020   GLUCOSE 90 03/15/2020   CHOL 130 03/15/2020   TRIG 142.0 03/15/2020   HDL 38.70 (L) 03/15/2020   LDLDIRECT 78.8 02/05/2011   LDLCALC 63 03/15/2020   ALT 16 03/15/2020   AST 19 03/15/2020   NA 141 03/15/2020   K 4.3 03/15/2020   CL 107 03/15/2020   CREATININE 1.16 03/15/2020   BUN 17 03/15/2020   CO2 29 03/15/2020   TSH 3.05 03/15/2020   PSA 0.00 (L) 03/15/2020   HGBA1C 5.7 03/07/2014    DG Chest 2 View  Result Date: 01/26/2020 CLINICAL DATA:  Cough and congestion EXAM: CHEST - 2 VIEW COMPARISON:  April 26, 2016 FINDINGS: Interstitial thickening remains. No edema or airspace opacity. Heart size and pulmonary vascularity are normal. No adenopathy. There is mild degenerative change in the thoracic spine. IMPRESSION: There is interstitial thickening, stable compared to the 2017 study, likely reflective of a degree of underlying chronic inflammation/mild fibrosis. No edema or airspace opacity. Cardiac silhouette normal. No  adenopathy. Electronically Signed   By: Lowella Grip III M.D.   On: 01/26/2020 15:07    Assessment & Plan:    Walker Kehr, MD

## 2020-08-01 NOTE — Assessment & Plan Note (Signed)
LFTs Abd [place]

## 2020-08-01 NOTE — Assessment & Plan Note (Signed)
Pt had a lot of cough CXR  Tessalon prn

## 2020-08-15 ENCOUNTER — Ambulatory Visit
Admission: RE | Admit: 2020-08-15 | Discharge: 2020-08-15 | Disposition: A | Discharge status: Home / Self Care | Payer: Medicare HMO | Source: Ambulatory Visit | Attending: Internal Medicine | Admitting: Internal Medicine

## 2020-08-15 DIAGNOSIS — R1011 Right upper quadrant pain: Secondary | ICD-10-CM | POA: Diagnosis not present

## 2020-08-15 DIAGNOSIS — Z9049 Acquired absence of other specified parts of digestive tract: Secondary | ICD-10-CM | POA: Diagnosis not present

## 2020-08-24 ENCOUNTER — Telehealth: Payer: Self-pay | Admitting: Internal Medicine

## 2020-08-24 NOTE — Telephone Encounter (Signed)
LVM for pt to rtn my call to schedule awv with nha. Please schedule appt if pt calls the office.  

## 2020-09-04 ENCOUNTER — Ambulatory Visit (INDEPENDENT_AMBULATORY_CARE_PROVIDER_SITE_OTHER): Payer: Medicare HMO | Admitting: Podiatry

## 2020-09-04 ENCOUNTER — Other Ambulatory Visit: Payer: Self-pay

## 2020-09-04 ENCOUNTER — Ambulatory Visit (INDEPENDENT_AMBULATORY_CARE_PROVIDER_SITE_OTHER): Payer: Medicare HMO

## 2020-09-04 DIAGNOSIS — M722 Plantar fascial fibromatosis: Secondary | ICD-10-CM | POA: Diagnosis not present

## 2020-09-04 DIAGNOSIS — M7750 Other enthesopathy of unspecified foot: Secondary | ICD-10-CM | POA: Diagnosis not present

## 2020-09-04 DIAGNOSIS — M7731 Calcaneal spur, right foot: Secondary | ICD-10-CM | POA: Diagnosis not present

## 2020-09-04 NOTE — Patient Instructions (Signed)
For instructions on how to put on your Night Splint, please visit [email or web address]   Plantar Fasciitis (Heel Spur Syndrome) with Rehab The plantar fascia is a fibrous, ligament-like, soft-tissue structure that spans the bottom of the foot. Plantar fasciitis is a condition that causes pain in the foot due to inflammation of the tissue. SYMPTOMS   Pain and tenderness on the underneath side of the foot.  Pain that worsens with standing or walking. CAUSES  Plantar fasciitis is caused by irritation and injury to the plantar fascia on the underneath side of the foot. Common mechanisms of injury include:  Direct trauma to bottom of the foot.  Damage to a small nerve that runs under the foot where the main fascia attaches to the heel bone.  Stress placed on the plantar fascia due to bone spurs. RISK INCREASES WITH:   Activities that place stress on the plantar fascia (running, jumping, pivoting, or cutting).  Poor strength and flexibility.  Improperly fitted shoes.  Tight calf muscles.  Flat feet.  Failure to warm-up properly before activity.  Obesity. PREVENTION  Warm up and stretch properly before activity.  Allow for adequate recovery between workouts.  Maintain physical fitness:  Strength, flexibility, and endurance.  Cardiovascular fitness.  Maintain a health body weight.  Avoid stress on the plantar fascia.  Wear properly fitted shoes, including arch supports for individuals who have flat feet.  PROGNOSIS  If treated properly, then the symptoms of plantar fasciitis usually resolve without surgery. However, occasionally surgery is necessary.  RELATED COMPLICATIONS   Recurrent symptoms that may result in a chronic condition.  Problems of the lower back that are caused by compensating for the injury, such as limping.  Pain or weakness of the foot during push-off following surgery.  Chronic inflammation, scarring, and partial or complete fascia tear,  occurring more often from repeated injections.  TREATMENT  Treatment initially involves the use of ice and medication to help reduce pain and inflammation. The use of strengthening and stretching exercises may help reduce pain with activity, especially stretches of the Achilles tendon. These exercises may be performed at home or with a therapist. Your caregiver may recommend that you use heel cups of arch supports to help reduce stress on the plantar fascia. Occasionally, corticosteroid injections are given to reduce inflammation. If symptoms persist for greater than 6 months despite non-surgical (conservative), then surgery may be recommended.   MEDICATION   If pain medication is necessary, then nonsteroidal anti-inflammatory medications, such as aspirin and ibuprofen, or other minor pain relievers, such as acetaminophen, are often recommended.  Do not take pain medication within 7 days before surgery.  Prescription pain relievers may be given if deemed necessary by your caregiver. Use only as directed and only as much as you need.  Corticosteroid injections may be given by your caregiver. These injections should be reserved for the most serious cases, because they may only be given a certain number of times.  HEAT AND COLD  Cold treatment (icing) relieves pain and reduces inflammation. Cold treatment should be applied for 10 to 15 minutes every 2 to 3 hours for inflammation and pain and immediately after any activity that aggravates your symptoms. Use ice packs or massage the area with a piece of ice (ice massage).  Heat treatment may be used prior to performing the stretching and strengthening activities prescribed by your caregiver, physical therapist, or athletic trainer. Use a heat pack or soak the injury in warm water.  SEEK IMMEDIATE MEDICAL CARE   IF:  Treatment seems to offer no benefit, or the condition worsens.  Any medications produce adverse side effects.  EXERCISES- RANGE OF  MOTION (ROM) AND STRETCHING EXERCISES - Plantar Fasciitis (Heel Spur Syndrome) These exercises may help you when beginning to rehabilitate your injury. Your symptoms may resolve with or without further involvement from your physician, physical therapist or athletic trainer. While completing these exercises, remember:   Restoring tissue flexibility helps normal motion to return to the joints. This allows healthier, less painful movement and activity.  An effective stretch should be held for at least 30 seconds.  A stretch should never be painful. You should only feel a gentle lengthening or release in the stretched tissue.  RANGE OF MOTION - Toe Extension, Flexion  Sit with your right / left leg crossed over your opposite knee.  Grasp your toes and gently pull them back toward the top of your foot. You should feel a stretch on the bottom of your toes and/or foot.  Hold this stretch for 10 seconds.  Now, gently pull your toes toward the bottom of your foot. You should feel a stretch on the top of your toes and or foot.  Hold this stretch for 10 seconds. Repeat  times. Complete this stretch 3 times per day.   RANGE OF MOTION - Ankle Dorsiflexion, Active Assisted  Remove shoes and sit on a chair that is preferably not on a carpeted surface.  Place right / left foot under knee. Extend your opposite leg for support.  Keeping your heel down, slide your right / left foot back toward the chair until you feel a stretch at your ankle or calf. If you do not feel a stretch, slide your bottom forward to the edge of the chair, while still keeping your heel down.  Hold this stretch for 10 seconds. Repeat 3 times. Complete this stretch 2 times per day.   STRETCH  Gastroc, Standing  Place hands on wall.  Extend right / left leg, keeping the front knee somewhat bent.  Slightly point your toes inward on your back foot.  Keeping your right / left heel on the floor and your knee straight, shift  your weight toward the wall, not allowing your back to arch.  You should feel a gentle stretch in the right / left calf. Hold this position for 10 seconds. Repeat 3 times. Complete this stretch 2 times per day.  STRETCH  Soleus, Standing  Place hands on wall.  Extend right / left leg, keeping the other knee somewhat bent.  Slightly point your toes inward on your back foot.  Keep your right / left heel on the floor, bend your back knee, and slightly shift your weight over the back leg so that you feel a gentle stretch deep in your back calf.  Hold this position for 10 seconds. Repeat 3 times. Complete this stretch 2 times per day.  STRETCH  Gastrocsoleus, Standing  Note: This exercise can place a lot of stress on your foot and ankle. Please complete this exercise only if specifically instructed by your caregiver.   Place the ball of your right / left foot on a step, keeping your other foot firmly on the same step.  Hold on to the wall or a rail for balance.  Slowly lift your other foot, allowing your body weight to press your heel down over the edge of the step.  You should feel a stretch in your right / left calf.  Hold this position   for 10 seconds.  Repeat this exercise with a slight bend in your right / left knee. Repeat 3 times. Complete this stretch 2 times per day.   STRENGTHENING EXERCISES - Plantar Fasciitis (Heel Spur Syndrome)  These exercises may help you when beginning to rehabilitate your injury. They may resolve your symptoms with or without further involvement from your physician, physical therapist or athletic trainer. While completing these exercises, remember:   Muscles can gain both the endurance and the strength needed for everyday activities through controlled exercises.  Complete these exercises as instructed by your physician, physical therapist or athletic trainer. Progress the resistance and repetitions only as guided.  STRENGTH - Towel Curls  Sit in  a chair positioned on a non-carpeted surface.  Place your foot on a towel, keeping your heel on the floor.  Pull the towel toward your heel by only curling your toes. Keep your heel on the floor. Repeat 3 times. Complete this exercise 2 times per day.  STRENGTH - Ankle Inversion  Secure one end of a rubber exercise band/tubing to a fixed object (table, pole). Loop the other end around your foot just before your toes.  Place your fists between your knees. This will focus your strengthening at your ankle.  Slowly, pull your big toe up and in, making sure the band/tubing is positioned to resist the entire motion.  Hold this position for 10 seconds.  Have your muscles resist the band/tubing as it slowly pulls your foot back to the starting position. Repeat 3 times. Complete this exercises 2 times per day.  Document Released: 05/27/2005 Document Revised: 08/19/2011 Document Reviewed: 09/08/2008 [employer] Patient Information 2014 [employer]  

## 2020-09-07 NOTE — Progress Notes (Signed)
Subjective:   Patient ID: Edwin Ramirez, male   DOB: 80 y.o.   MRN: [id number]   HPI 80 year old male presents the office today for concerns of right heel pain which is been ongoing since the first week of March.  He states that he was at the women's [facility] tournament and was walking on the hard surfaces and afterwards is when he noticed the discomfort in the bottom of his heel.  Pain is intermittent throughout the day.  Is worsened when he first gets up and after being on his feet.  No particular injury.  No swelling he reports.  He has no other concerns.   Review of Systems  All other systems reviewed and are negative.  Past Medical History:  Diagnosis Date  . Basal cell cancer    Dr Ubaldo Glassing  . DDD (degenerative disc disease)   . Diverticulosis   . Esophageal stricture    Dr Olevia Perches  . Gastritis   . GERD (gastroesophageal reflux disease)   . Gilbert syndrome   . Glaucoma   . Hepatic cyst   . History of prostate cancer    Dr Alinda Money  . Hx of adenomatous colonic polyps   . Hyperlipidemia    has had good cholesterol count for years.  . Internal hemorrhoids   . Pulmonary nodule    not seen on F/U 2014  . TMJ (dislocation of temporomandibular joint)     Past Surgical History:  Procedure Laterality Date  . APPENDECTOMY    . cataract surgery  2012   bilateral  . CHOLECYSTECTOMY  1985  . COLONOSCOPY W/ POLYPECTOMY  2004   Negative 2009 & 2013 Dr.Brodie  . ESOPHAGEAL DILATION  2004  . MOHS SURGERY     Basal Cell  . PROSTATECTOMY  07/2005   Dr.Borden  . ROTATOR CUFF REPAIR      X 2-Left; Dr Percell Miller     Current Outpatient Medications:  .  b complex vitamins tablet, Take 1 tablet by mouth daily., Disp: 100 tablet, Rfl: 3 .  benzonatate (TESSALON) 200 MG capsule, Take 1 capsule (200 mg total) by mouth 2 (two) times daily as needed for cough., Disp: 20 capsule, Rfl: 0 .  Cholecalciferol (VITAMIN D3) 50 MCG (2000 UT) capsule, Take 1 capsule (2,000 Units total) by mouth  daily., Disp: 100 capsule, Rfl: 3 .  dicyclomine (BENTYL) 10 MG capsule, Take 1 capsule (10 mg total) by mouth 4 (four) times daily -  before meals and at bedtime., Disp: 100 capsule, Rfl: 3 .  HYDROcodone-acetaminophen (NORCO/VICODIN) 5-325 MG tablet, Take 0.5-1 tablets by mouth every 6 (six) hours as needed for severe pain., Disp: 20 tablet, Rfl: 0 .  latanoprost (XALATAN) 0.005 % ophthalmic solution, Place 1 drop into both eyes at bedtime., Disp: , Rfl:  .  LORazepam (ATIVAN) 1 MG tablet, Take 1 tablet (1 mg total) by mouth every 8 (eight) hours as needed for anxiety., Disp: 90 tablet, Rfl: 1 .  omeprazole (PRILOSEC) 40 MG capsule, Take 1 capsule (40 mg total) by mouth 2 (two) times daily., Disp: 180 capsule, Rfl: 3 .  rosuvastatin (CRESTOR) 10 MG tablet, TAKE 1 TABLET BY MOUTH EVERY DAY, Disp: 90 tablet, Rfl: 3 .  traMADol (ULTRAM) 50 MG tablet, Take 1 tablet (50 mg total) by mouth every 6 (six) hours as needed for severe pain., Disp: 20 tablet, Rfl: 0 .  traZODone (DESYREL) 50 MG tablet, TAKE 1-2 TABLETS (50-100 MG TOTAL) BY MOUTH AT BEDTIME AS NEEDED FOR SLEEP.,  Disp: 60 tablet, Rfl: 5  Allergies  Allergen Reactions  . Codeine     REACTION: agitation, mental status changes with high doses of codeine post op Can take Tramadol OK, low dose Norco ok  . Nabumetone     Mental status changes post op with Relafen  . Pneumovax [Pneumococcal Polysaccharide Vaccine]     swelling  . Aspirin     gastritis         Objective:  Physical Exam  General: AAO x3, NAD  Dermatological: Skin is warm, dry and supple bilateral. There are no open sores, no preulcerative lesions, no rash or signs of infection present.  Vascular: Dorsalis Pedis artery and Posterior Tibial artery pedal pulses are 2/4 bilateral with immedate capillary fill time. There is no pain with calf compression, swelling, warmth, erythema.   Neruologic: Grossly intact via light touch bilateral. Negative tinel  sign.  Musculoskeletal: Edwin Ramirez discomfort is on the plantar medial tubercle of the calcaneus with insertion of plantar fascial.  There is no pain with lateral compression of calcaneus.  There is no pain in the posterior calcaneus.  No pain along the Achilles tendon.  Thompson test is negative.  There is no edema, erythema.  Flexor, extensor tendons appear to be intact.  Muscular strength 5/5 in all groups tested bilateral.  Gait: Unassisted, Nonantalgic.       Assessment:   80 year old male with right heel pain, likely plantar fasciitis    Plan:  -Treatment options discussed including all alternatives, risks, and complications -Etiology of symptoms were discussed -X-rays were obtained and reviewed with the patient.  There is a calcification is noted.  Mild inferior calcaneal spurring present.  Bunion present.  No evidence of acute fracture. -We discussed steroid injection. -Night splint dispensed as well as plantar fascial brace -Discussed stretching, icing daily. -Discussed shoe modifications and orthotics.  Edwin Ramirez DPM

## 2020-09-27 ENCOUNTER — Other Ambulatory Visit: Payer: Self-pay | Admitting: Internal Medicine

## 2020-10-03 DIAGNOSIS — H0102B Squamous blepharitis left eye, upper and lower eyelids: Secondary | ICD-10-CM | POA: Diagnosis not present

## 2020-10-03 DIAGNOSIS — H0102A Squamous blepharitis right eye, upper and lower eyelids: Secondary | ICD-10-CM | POA: Diagnosis not present

## 2020-10-03 DIAGNOSIS — H43813 Vitreous degeneration, bilateral: Secondary | ICD-10-CM | POA: Diagnosis not present

## 2020-10-03 DIAGNOSIS — H35373 Puckering of macula, bilateral: Secondary | ICD-10-CM | POA: Diagnosis not present

## 2020-10-03 DIAGNOSIS — H401131 Primary open-angle glaucoma, bilateral, mild stage: Secondary | ICD-10-CM | POA: Diagnosis not present

## 2020-10-03 DIAGNOSIS — Z961 Presence of intraocular lens: Secondary | ICD-10-CM | POA: Diagnosis not present

## 2020-10-10 DIAGNOSIS — Z85828 Personal history of other malignant neoplasm of skin: Secondary | ICD-10-CM | POA: Diagnosis not present

## 2020-10-10 DIAGNOSIS — L821 Other seborrheic keratosis: Secondary | ICD-10-CM | POA: Diagnosis not present

## 2020-10-10 DIAGNOSIS — D485 Neoplasm of uncertain behavior of skin: Secondary | ICD-10-CM | POA: Diagnosis not present

## 2020-10-10 DIAGNOSIS — L570 Actinic keratosis: Secondary | ICD-10-CM | POA: Diagnosis not present

## 2020-10-10 DIAGNOSIS — L905 Scar conditions and fibrosis of skin: Secondary | ICD-10-CM | POA: Diagnosis not present

## 2020-10-10 DIAGNOSIS — D1801 Hemangioma of skin and subcutaneous tissue: Secondary | ICD-10-CM | POA: Diagnosis not present

## 2020-10-12 DIAGNOSIS — Z1152 Encounter for screening for COVID-19: Secondary | ICD-10-CM | POA: Diagnosis not present

## 2020-10-17 DIAGNOSIS — M19012 Primary osteoarthritis, left shoulder: Secondary | ICD-10-CM | POA: Diagnosis not present

## 2020-10-26 ENCOUNTER — Other Ambulatory Visit: Payer: Self-pay

## 2020-10-26 ENCOUNTER — Encounter: Payer: Self-pay | Admitting: Internal Medicine

## 2020-10-26 ENCOUNTER — Ambulatory Visit (INDEPENDENT_AMBULATORY_CARE_PROVIDER_SITE_OTHER): Payer: Medicare HMO | Admitting: Internal Medicine

## 2020-10-26 DIAGNOSIS — S86812A Strain of other muscle(s) and tendon(s) at lower leg level, left leg, initial encounter: Secondary | ICD-10-CM | POA: Insufficient documentation

## 2020-10-26 NOTE — Patient Instructions (Addendum)
Compression Calf Sleeve Voltaren gel

## 2020-10-26 NOTE — Assessment & Plan Note (Addendum)
Compression Calf Sleeve Voltaren gel DVT prophylaxis for travel discussed

## 2020-10-26 NOTE — Progress Notes (Signed)
Subjective:  Patient ID: Edwin Ramirez, male    DOB: 01/02/1941  Age: 80 y.o. MRN: [id number]  CC: CALF PAIN ((R) LEG)   HPI Edwin Ramirez presents for R calf injury 1.5 wk ago - Edwin Ramirez has jumped up and pulled his R calf  Outpatient Medications Prior to Visit  Medication Sig Dispense Refill  . b complex vitamins tablet Take 1 tablet by mouth daily. 100 tablet 3  . benzonatate (TESSALON) 200 MG capsule Take 1 capsule (200 mg total) by mouth 2 (two) times daily as needed for cough. 20 capsule 0  . Cholecalciferol (VITAMIN D3) 50 MCG (2000 UT) capsule Take 1 capsule (2,000 Units total) by mouth daily. 100 capsule 3  . dicyclomine (BENTYL) 10 MG capsule Take 1 capsule (10 mg total) by mouth 4 (four) times daily -  before meals and at bedtime. 100 capsule 3  . HYDROcodone-acetaminophen (NORCO/VICODIN) 5-325 MG tablet Take 0.5-1 tablets by mouth every 6 (six) hours as needed for severe pain. 20 tablet 0  . latanoprost (XALATAN) 0.005 % ophthalmic solution Place 1 drop into both eyes at bedtime.    Marland Kitchen LORazepam (ATIVAN) 1 MG tablet Take 1 tablet (1 mg total) by mouth every 8 (eight) hours as needed for anxiety. 90 tablet 1  . omeprazole (PRILOSEC) 40 MG capsule Take 1 capsule (40 mg total) by mouth 2 (two) times daily. 180 capsule 3  . rosuvastatin (CRESTOR) 10 MG tablet TAKE 1 TABLET BY MOUTH EVERY DAY 90 tablet 1  . traMADol (ULTRAM) 50 MG tablet Take 1 tablet (50 mg total) by mouth every 6 (six) hours as needed for severe pain. 20 tablet 0  . traZODone (DESYREL) 50 MG tablet TAKE 1-2 TABLETS (50-100 MG TOTAL) BY MOUTH AT BEDTIME AS NEEDED FOR SLEEP. 60 tablet 5   No facility-administered medications prior to visit.    ROS: Review of Systems  Constitutional: Negative for appetite change, fatigue and unexpected weight change.  HENT: Negative for congestion, nosebleeds, sneezing, sore throat and trouble swallowing.   Eyes: Negative for itching and visual disturbance.  Respiratory:  Negative for cough.   Cardiovascular: Negative for chest pain, palpitations and leg swelling.  Gastrointestinal: Negative for abdominal distention, blood in stool, diarrhea and nausea.  Genitourinary: Negative for frequency and hematuria.  Musculoskeletal: Positive for gait problem. Negative for back pain, joint swelling and neck pain.  Skin: Negative for rash.  Neurological: Negative for dizziness, tremors, speech difficulty and weakness.  Psychiatric/Behavioral: Negative for agitation, dysphoric mood, sleep disturbance and suicidal ideas. The patient is not nervous/anxious.     Objective:  BP 130/62 (BP Location: Left Arm)   Pulse (!) 57   Temp 97.8 F (36.6 C) (Oral)   Ht 5\' 7"  (1.702 m)   Wt 155 lb 3.2 oz (70.4 kg)   SpO2 96%   BMI 24.31 kg/m   BP Readings from Last 3 Encounters:  10/26/20 130/62  08/01/20 122/60  05/10/20 130/60    Wt Readings from Last 3 Encounters:  10/26/20 155 lb 3.2 oz (70.4 kg)  08/01/20 156 lb 9.6 oz (71 kg)  05/10/20 155 lb (70.3 kg)    Physical Exam Constitutional:      General: He is not in acute distress.    Appearance: He is well-developed.     Comments: NAD  Eyes:     Conjunctiva/sclera: Conjunctivae normal.     Pupils: Pupils are equal, round, and reactive to light.  Neck:     Thyroid: No  thyromegaly.     Vascular: No JVD.  Cardiovascular:     Rate and Rhythm: Normal rate and regular rhythm.     Heart sounds: Normal heart sounds. No murmur heard. No friction rub. No gallop.   Pulmonary:     Effort: Pulmonary effort is normal. No respiratory distress.     Breath sounds: Normal breath sounds. No wheezing or rales.  Chest:     Chest wall: No tenderness.  Abdominal:     General: Bowel sounds are normal. There is no distension.     Palpations: Abdomen is soft. There is no mass.     Tenderness: There is no abdominal tenderness. There is no guarding or rebound.  Musculoskeletal:        General: No tenderness. Normal range of  motion.     Cervical back: Normal range of motion.  Lymphadenopathy:     Cervical: No cervical adenopathy.  Skin:    General: Skin is warm and dry.     Findings: No rash.  Neurological:     Mental Status: He is alert and oriented to person, place, and time.     Cranial Nerves: No cranial nerve deficit.     Motor: No abnormal muscle tone.     Coordination: Coordination normal.     Gait: Gait normal.     Deep Tendon Reflexes: Reflexes are normal and symmetric.  Psychiatric:        Behavior: Behavior normal.        Thought Content: Thought content normal.        Judgment: Judgment normal.   Bulk calves without swelling, pain, discoloration  Lab Results  Component Value Date   WBC 4.2 08/01/2020   HGB 12.8 (L) 08/01/2020   HCT 38.0 (L) 08/01/2020   PLT 121.0 (L) 08/01/2020   GLUCOSE 117 (H) 08/01/2020   CHOL 130 03/15/2020   TRIG 142.0 03/15/2020   HDL 38.70 (L) 03/15/2020   LDLDIRECT 78.8 02/05/2011   LDLCALC 63 03/15/2020   ALT 15 08/01/2020   AST 19 08/01/2020   NA 140 08/01/2020   K 4.2 08/01/2020   CL 107 08/01/2020   CREATININE 0.96 08/01/2020   BUN 12 08/01/2020   CO2 27 08/01/2020   TSH 3.05 03/15/2020   PSA 0.00 (L) 03/15/2020   HGBA1C 5.7 03/07/2014    US Abdomen Complete  Result Date: 08/15/2020 CLINICAL DATA:  Right upper quadrant and flank pain. EXAM: ABDOMEN ULTRASOUND COMPLETE COMPARISON:  CT abdomen pelvis with contrast 07/23/2016 FINDINGS: Gallbladder: Surgically absent Common bile duct: Diameter: 8 mm Liver: No focal lesion identified. Within normal limits in parenchymal echogenicity. Mild intrahepatic biliary dilatation unchanged from prior CT. Portal vein is patent on color Doppler imaging with normal direction of blood flow towards the liver. IVC: No abnormality visualized. Pancreas: Visualized portion unremarkable. Spleen: Size and appearance within normal limits. Right Kidney: Length: 11.5 cm. Echogenicity within normal limits. No mass or  hydronephrosis visualized. Left Kidney: Length: 11.9 cm. Echogenicity within normal limits. No mass or hydronephrosis visualized. Abdominal aorta: No aneurysm visualized. Other findings: None. IMPRESSION: No acute sonographic abnormality of the abdomen. Electronically Signed   By: Miachel Roux M.D.   On: 08/15/2020 15:05    Assessment & Plan:    Walker Kehr, MD

## 2020-11-03 ENCOUNTER — Telehealth: Payer: Self-pay | Admitting: *Deleted

## 2020-11-03 NOTE — Telephone Encounter (Signed)
Rec'd mag " This request has been approved.".Marland KitchenJohny Chess

## 2020-11-03 NOTE — Telephone Encounter (Signed)
Rec'd PA for Dicyclomine. Completed via cover-my-meds w/ (Key: [id number]). Rec'd msg stating " Your information has been submitted to [employer] [facility] [employer] Part D. [employer] Part D will review the request and will issue a decision, typically within 1-3 days from your submission.".Marland KitchenJohny Chess

## 2020-11-05 DIAGNOSIS — B9689 Other specified bacterial agents as the cause of diseases classified elsewhere: Secondary | ICD-10-CM | POA: Diagnosis not present

## 2020-11-05 DIAGNOSIS — J329 Chronic sinusitis, unspecified: Secondary | ICD-10-CM | POA: Diagnosis not present

## 2020-11-05 DIAGNOSIS — Z20822 Contact with and (suspected) exposure to covid-19: Secondary | ICD-10-CM | POA: Diagnosis not present

## 2020-12-13 DIAGNOSIS — L570 Actinic keratosis: Secondary | ICD-10-CM | POA: Diagnosis not present

## 2020-12-28 ENCOUNTER — Encounter: Payer: Self-pay | Admitting: Sports Medicine

## 2020-12-28 ENCOUNTER — Ambulatory Visit: Payer: Medicare HMO | Admitting: Sports Medicine

## 2020-12-28 ENCOUNTER — Other Ambulatory Visit: Payer: Self-pay

## 2020-12-28 DIAGNOSIS — L03031 Cellulitis of right toe: Secondary | ICD-10-CM | POA: Diagnosis not present

## 2020-12-28 DIAGNOSIS — M79674 Pain in right toe(s): Secondary | ICD-10-CM | POA: Diagnosis not present

## 2020-12-28 MED ORDER — AMOXICILLIN-POT CLAVULANATE 875-125 MG PO TABS: 1.0000 | ORAL_TABLET | Freq: Two times a day (BID) | ORAL | 0 refills | Status: DC

## 2020-12-28 MED ORDER — NEOMYCIN-POLYMYXIN-HC 3.5-10000-1 OT SOLN: OTIC | 0 refills | Status: DC

## 2020-12-28 NOTE — Progress Notes (Signed)
Subjective: Edwin Ramirez is a 80 y.o. male patient presents to office today complaining of a pain and redness around the right third toenail medial border reports that he was trimming his toenails and he thinks that he clipped the skin with a nail trimmer and since then it has been slower the original injury happened about 10 days ago states that last week it was looking better but then this week it started to look back with bleeding and redness to the area.  Patient has tried soaking Band-Aid and spacer to the area.  Patient denies current constitutional symptoms at this time.  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 07/20/2019   Eczema of tip of finger 06/13/2019   Carotid bruit 03/15/2019   Occipital neuralgia 03/09/2019   Insomnia 03/09/2019   Earache on left 01/12/2019   CAD (coronary artery disease) 07/07/2018   Rash 01/26/2018   Chest wall contusion, right, initial encounter 09/25/2017   Sinusitis 09/03/2017   Multiple bruises 12/16/2016   Concussion with no loss of consciousness 12/16/2016   Abdominal pain 07/08/2016   Nausea 07/08/2016   RUQ pain 04/26/2016   Back pain 04/26/2016   Low back pain radiating to right lower extremity 03/01/2016   Sciatic radiculitis 02/13/2016   Well adult exam 03/01/2015   Chest wall pain 03/19/2010   DECREASED HEARING 01/17/2009   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   PULMONARY NODULE 05/19/2008   HEMORRHOIDS, INTERNAL 05/18/2008   ESOPHAGEAL STRICTURE 05/18/2008   DIVERTICULOSIS OF COLON 05/18/2008   COLONIC POLYPS, ADENOMATOUS, HX OF 05/18/2008   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 100 tablet 3   benzonatate (TESSALON) 200 MG capsule Take 1 capsule (200 mg total) by mouth 2 (two) times daily as needed for cough. 20 capsule 0   Cholecalciferol (VITAMIN D3) 50 MCG (2000 UT) capsule Take 1 capsule (2,000 Units total) by mouth daily. 100 capsule 3   dicyclomine (BENTYL) 10 MG capsule Take 1 capsule (10 mg total) by mouth 4 (four) times daily -  before meals and at bedtime. 100 capsule 3   HYDROcodone-acetaminophen (NORCO/VICODIN) 5-325 MG tablet Take 0.5-1 tablets by mouth every 6 (six) hours as needed for severe pain. 20 tablet 0   latanoprost (XALATAN) 0.005 % ophthalmic solution Place 1 drop into both eyes at bedtime.     LORazepam (ATIVAN) 1 MG tablet Take 1 tablet (1 mg total) by mouth every 8 (eight) hours as needed for anxiety. 90 tablet 1   omeprazole (PRILOSEC) 40 MG capsule Take 1 capsule (40 mg total) by mouth 2 (two) times daily. 180 capsule 3   rosuvastatin (CRESTOR) 10 MG tablet TAKE 1 TABLET BY MOUTH EVERY DAY 90 tablet 1   traMADol (ULTRAM) 50 MG tablet Take 1 tablet (50 mg total) by mouth every 6 (six) hours as needed for severe pain. 20 tablet 0   traZODone (DESYREL) 50 MG tablet TAKE 1-2 TABLETS (50-100 MG TOTAL) BY MOUTH AT BEDTIME AS NEEDED FOR SLEEP.  60 tablet 5   No current facility-administered medications on file prior to visit.    Allergies  Allergen Reactions   Codeine     REACTION: agitation, mental status changes with high doses of codeine post op Can take Tramadol OK, low dose Norco ok   Nabumetone     Mental status changes post op with Relafen   Pneumovax [Pneumococcal Polysaccharide Vaccine]     swelling   Aspirin     gastritis    Objective:  There were no vitals filed for this visit.  General: Well developed, nourished, in no acute distress, alert and oriented x3    Dermatology: Skin is warm, dry and supple bilateral.  Right third toe nail appears to be normal with no obvious incurvation but there is some bleeding and localized swelling to the medial nail border. The remaining nails appear unremarkable at this time. There are no open sores, lesions or other signs of infection  present.  Vascular: Dorsalis Pedis artery and Posterior Tibial artery pedal pulses are 2/4 bilateral with immedate capillary fill time. Pedal hair growth present. No lower extremity edema.   Neruologic: Grossly intact via light touch bilateral.  Musculoskeletal: Tenderness to palpation of the right third toe medial nail fold. Muscular strength within normal limits in all groups bilateral.   Assesement and Plan: Problem List Items Addressed This Visit   None Visit Diagnoses     Paronychia of third toe, right    -  Primary   Toe pain, right           -Discussed treatment options for likely acute infection versus healing abrasion at likely self-inflicted injury from patient trimming his toenail -Using a sterile nail nipper debrided right third toenail there was no obvious ingrowing corner noted however due to the localized bleeding pain and redness we will start oral antibiotics -Prescribe Augmentin for patient to take as instructed -Advised patient to continue with soaking with Epsom salt and water 15 to 20 minutes once a day and apply Corticosporin solution to the corner of the nail as provided and advised patient to remove Band-Aid at bedtime -Patient was instructed to monitor the toe for signs of infection and return to office if toe becomes more red, hot or swollen. -Advised patient to continue with toe separator as tolerated -Patient is to return in 2 to 3 weeks for follow up care/nail check or sooner if problems arise.  Advised patient if nail is not better at next visit may benefit from an avulsion procedure however at this time no acute indication noted since there was  no obvious nail in the nail fold.  Landis Martins, DPM

## 2020-12-30 ENCOUNTER — Other Ambulatory Visit: Payer: Self-pay | Admitting: Internal Medicine

## 2021-01-18 ENCOUNTER — Ambulatory Visit: Payer: Medicare HMO | Admitting: Sports Medicine

## 2021-02-06 DIAGNOSIS — H0102B Squamous blepharitis left eye, upper and lower eyelids: Secondary | ICD-10-CM | POA: Diagnosis not present

## 2021-02-06 DIAGNOSIS — Z961 Presence of intraocular lens: Secondary | ICD-10-CM | POA: Diagnosis not present

## 2021-02-06 DIAGNOSIS — H0102A Squamous blepharitis right eye, upper and lower eyelids: Secondary | ICD-10-CM | POA: Diagnosis not present

## 2021-02-06 DIAGNOSIS — H35373 Puckering of macula, bilateral: Secondary | ICD-10-CM | POA: Diagnosis not present

## 2021-02-06 DIAGNOSIS — H43813 Vitreous degeneration, bilateral: Secondary | ICD-10-CM | POA: Diagnosis not present

## 2021-02-06 DIAGNOSIS — H401131 Primary open-angle glaucoma, bilateral, mild stage: Secondary | ICD-10-CM | POA: Diagnosis not present

## 2021-02-13 DIAGNOSIS — C44222 Squamous cell carcinoma of skin of right ear and external auricular canal: Secondary | ICD-10-CM | POA: Diagnosis not present

## 2021-02-14 ENCOUNTER — Telehealth: Payer: Self-pay | Admitting: Internal Medicine

## 2021-02-14 NOTE — Telephone Encounter (Signed)
Left message for patient to call me back at [phone] to schedule Medicare Annual Wellness Visit   Last AWV  04/05/19  Please schedule at anytime with [facility] if patient calls the office back.    40 Minutes appointment   Any questions, please call me at [phone]

## 2021-03-02 ENCOUNTER — Other Ambulatory Visit: Payer: Self-pay

## 2021-03-02 ENCOUNTER — Ambulatory Visit (INDEPENDENT_AMBULATORY_CARE_PROVIDER_SITE_OTHER): Payer: Medicare HMO

## 2021-03-02 ENCOUNTER — Ambulatory Visit
Admission: EM | Admit: 2021-03-02 | Discharge: 2021-03-02 | Disposition: A | Discharge status: Home / Self Care | Payer: Medicare HMO | Attending: Internal Medicine | Admitting: Internal Medicine

## 2021-03-02 DIAGNOSIS — R0781 Pleurodynia: Secondary | ICD-10-CM

## 2021-03-02 DIAGNOSIS — R0782 Intercostal pain: Secondary | ICD-10-CM | POA: Diagnosis not present

## 2021-03-02 DIAGNOSIS — W19XXXA Unspecified fall, initial encounter: Secondary | ICD-10-CM

## 2021-03-02 DIAGNOSIS — Z043 Encounter for examination and observation following other accident: Secondary | ICD-10-CM | POA: Diagnosis not present

## 2021-03-02 NOTE — ED Triage Notes (Signed)
Pt c/o fall today he fell on left side and is having left rib discomfort and requests xray. Described as 3/10

## 2021-03-02 NOTE — ED Provider Notes (Signed)
[facility]    CSN: [id number] Arrival date & time: 03/02/21  1620      History   Chief Complaint Chief Complaint  Patient presents with   rib discomfort    HPI Edwin Ramirez is a 80 y.o. male.   Patient presents with left-sided rib pain that started today after a fall that occurred.  Patient states that he tried to step over a trailer hitch that was connected to a truck and tripped and landed on his left side on concrete.  Denies hitting head or losing consciousness.  Denies taking any blood thinners currently.  Denies any pain in any other part of the body.    Past Medical History:  Diagnosis Date   Basal cell cancer    Dr Ubaldo Glassing   DDD (degenerative disc disease)    Diverticulosis    Esophageal stricture    Dr Olevia Perches   Gastritis    GERD (gastroesophageal reflux disease)    Rosanna Randy syndrome    Glaucoma    Hepatic cyst    History of prostate cancer    Dr Alinda Money   Hx of adenomatous colonic polyps    Hyperlipidemia    has had good cholesterol count for years.   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 07/20/2019   Eczema of tip of finger 06/13/2019   Carotid bruit 03/15/2019   Occipital neuralgia 03/09/2019   Insomnia 03/09/2019   Earache on left 01/12/2019   CAD (coronary artery disease) 07/07/2018   Rash 01/26/2018   Chest wall contusion, right, initial encounter 09/25/2017   Sinusitis 09/03/2017   Multiple bruises 12/16/2016   Concussion with no loss of consciousness 12/16/2016   Abdominal pain 07/08/2016   Nausea 07/08/2016   RUQ pain 04/26/2016   Back pain 04/26/2016   Low back pain radiating to  right lower extremity 03/01/2016   Sciatic radiculitis 02/13/2016   Well adult exam 03/01/2015   Chest wall pain 03/19/2010   DECREASED HEARING 01/17/2009   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   PULMONARY NODULE 05/19/2008   HEMORRHOIDS, INTERNAL 05/18/2008   ESOPHAGEAL STRICTURE 05/18/2008   DIVERTICULOSIS OF COLON 05/18/2008   COLONIC POLYPS, ADENOMATOUS, HX OF 05/18/2008   Glaucoma 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 Authorizing Provider  amoxicillin-clavulanate (AUGMENTIN) 875-125 MG tablet Take 1 tablet by mouth 2 (two) times daily. 12/28/20   Landis Martins, DPM  b complex vitamins tablet Take 1 tablet by mouth daily. 03/09/19   Plotnikov, Evie Lacks, MD  benzonatate (TESSALON) 200 MG capsule Take 1 capsule (200 mg total) by mouth 2 (two) times daily as needed for cough. 08/01/20   Plotnikov, Evie Lacks, MD  Cholecalciferol (VITAMIN D3) 50 MCG (2000 UT) capsule Take 1 capsule (2,000 Units total) by mouth daily. 03/09/19   Plotnikov, Evie Lacks, MD  dicyclomine (BENTYL) 10 MG capsule TAKE 1 CAPSULE (10 MG TOTAL) BY MOUTH 4 (FOUR) TIMES DAILY - BEFORE MEALS AND AT BEDTIME. 01/01/21   Plotnikov, Evie Lacks, MD  HYDROcodone-acetaminophen (NORCO/VICODIN) 5-325 MG tablet Take 0.5-1 tablets by mouth every 6 (six) hours as needed for severe pain. 08/01/20 08/01/21  Plotnikov, Evie Lacks, MD  latanoprost  (XALATAN) 0.005 % ophthalmic solution Place 1 drop into both eyes at bedtime.    [provider]  LORazepam (ATIVAN) 1 MG tablet Take 1 tablet (1 mg total) by mouth every 8 (eight) hours as needed for anxiety. 05/10/20   Plotnikov, Evie Lacks, MD  neomycin-polymyxin-hydrocortisone (CORTISPORIN) OTIC solution Apply 1-2 drops to toe after soaking once daily 12/28/20   Landis Martins, DPM  omeprazole (PRILOSEC) 40 MG capsule Take 1 capsule (40 mg total) by mouth 2 (two) times daily. 03/15/20   Plotnikov, Evie Lacks, MD  rosuvastatin (CRESTOR) 10 MG tablet TAKE 1 TABLET BY MOUTH EVERY DAY 09/27/20   Plotnikov, Evie Lacks, MD  traMADol (ULTRAM) 50 MG tablet Take 1 tablet (50 mg total) by mouth every 6 (six) hours as needed for severe pain. 05/10/20   Plotnikov, Evie Lacks, MD  traZODone (DESYREL) 50 MG tablet TAKE 1-2 TABLETS (50-100 MG TOTAL) BY MOUTH AT BEDTIME AS NEEDED FOR SLEEP. 02/28/20   Plotnikov, Evie Lacks, MD    Family History Family History  Problem Relation Age of Onset   Multiple myeloma Father    Colon cancer Mother 29   Heart failure Mother    Heart attack Maternal Uncle 65   Diabetes Neg Hx    Stroke Neg Hx    Esophageal cancer Neg Hx    Rectal cancer Neg Hx    Stomach cancer Neg Hx     Social History Social History   Tobacco Use   Smoking status: Never   Smokeless tobacco: Never  Vaping Use   Vaping Use: Never used  Substance Use Topics   Alcohol use: Yes    Alcohol/week: 5.0 standard drinks    Types: 5 Glasses of wine per week    Comment: Socially   Drug use: No     Allergies   Codeine, Nabumetone, Pneumovax [pneumococcal polysaccharide vaccine], and Aspirin   Review of Systems Review of Systems Per HPI  Physical Exam Triage Vital Signs ED Triage Vitals  Enc Vitals Group     BP 03/02/21 1631 135/71     Pulse Rate 03/02/21 1631 60     Resp 03/02/21 1631 18     Temp 03/02/21 1631 97.8 F (36.6 C)     Temp Source 03/02/21 1631 Oral     SpO2 03/02/21  1631 95 %     Weight --      Height --      Head Circumference --      Peak Flow --      Pain Score 03/02/21 1629 3     Pain Loc --      Pain Edu? --      Excl. in [place]? --    No data  found.  Updated Vital Signs BP 135/71 (BP Location: Left Arm)   Pulse 60   Temp 97.8 F (36.6 C) (Oral)   Resp 18   SpO2 95%   Visual Acuity Right Eye Distance:   Left Eye Distance:   Bilateral Distance:    Right Eye Near:   Left Eye Near:    Bilateral Near:     Physical Exam Constitutional:      Appearance: Normal appearance.  HENT:     Head: Normocephalic and atraumatic.  Eyes:     Extraocular Movements: Extraocular movements intact.     Conjunctiva/sclera: Conjunctivae normal.  Cardiovascular:     Rate and Rhythm: Normal rate and regular rhythm.     Pulses: Normal pulses.     Heart sounds: Normal heart sounds.  Pulmonary:     Effort: Pulmonary effort is normal. No respiratory distress.     Breath sounds: Normal breath sounds.  Musculoskeletal:     Comments: No pain to palpation to left ribs.  No bruising, abrasion, lacerations noted.  Patient states that pain occurs only with movement.  Skin:    General: Skin is warm and dry.  Neurological:     General: No focal deficit present.     Mental Status: He is alert and oriented to person, place, and time. Mental status is at baseline.  Psychiatric:        Mood and Affect: Mood normal.        Behavior: Behavior normal.        Thought Content: Thought content normal.        Judgment: Judgment normal.     UC Treatments / Results  Labs (all labs ordered are listed, but only abnormal results are displayed) Labs Reviewed - No data to display  EKG   Radiology DG Ribs Unilateral W/Chest Left  Result Date: 03/02/2021 CLINICAL DATA:  Fall on left side EXAM: LEFT RIBS AND CHEST - 3+ VIEW COMPARISON:  08/01/2020 FINDINGS: Single-view chest demonstrates no focal opacity or pleural effusion. Normal cardiomediastinal silhouette. No  visible pneumothorax. No definite acute displaced rib fracture is seen. Possible age indeterminate left seventh lateral rib fracture, favor chronic fracture. IMPRESSION: 1. No visible pneumothorax or pleural effusion. 2. No definite acute displaced rib fracture seen. Probable chronic fracture deformity of left seventh rib Electronically Signed   By: Donavan Foil M.D.   On: 03/02/2021 17:10    Procedures Procedures (including critical care time)  Medications Ordered in UC Medications - No data to display  Initial Impression / Assessment and Plan / UC Course  I have reviewed the triage vital signs and the nursing notes.  Pertinent labs & imaging results that were available during my care of the patient were reviewed by me and considered in my medical decision making (see chart for details).     Left rib x-ray was negative for any fracture or acute bony abnormality.  It did show old rib fracture that is present.  Suspect contusion to left abdomen/rib cage area that is causing pain.  No red flags on exam.  Advised patient to use over-the-counter pain relievers and ice application.  Patient to follow-up with PCP or urgent care if pain persists.  Discussed deep breathing exercises with patient as well. Discussed strict return precautions. Patient verbalized understanding and is agreeable with plan.  Final Clinical Impressions(s) / UC Diagnoses   Final diagnoses:  Fall, initial encounter  Rib pain on left side     Discharge Instructions  X-ray was negative for fracture.  Suspect bruising noted to left side of abdomen and rib area.  You may take over-the-counter pain relievers and use ice application.     ED Prescriptions   None    PDMP not reviewed this encounter.   Odis Luster, [place] 03/02/21 [phone]

## 2021-03-02 NOTE — Discharge Instructions (Addendum)
X-ray was negative for fracture.  Suspect bruising noted to left side of abdomen and rib area.  You may take over-the-counter pain relievers and use ice application.

## 2021-03-15 ENCOUNTER — Other Ambulatory Visit: Payer: Self-pay | Admitting: Internal Medicine

## 2021-03-19 ENCOUNTER — Encounter: Payer: Self-pay | Admitting: Internal Medicine

## 2021-03-19 ENCOUNTER — Ambulatory Visit (INDEPENDENT_AMBULATORY_CARE_PROVIDER_SITE_OTHER): Payer: Medicare HMO | Admitting: Internal Medicine

## 2021-03-19 ENCOUNTER — Other Ambulatory Visit: Payer: Self-pay

## 2021-03-19 VITALS — BP 130/62 | HR 65 | Temp 98.1°F | Ht 67.0 in | Wt 159.4 lb

## 2021-03-19 DIAGNOSIS — Z23 Encounter for immunization: Secondary | ICD-10-CM

## 2021-03-19 DIAGNOSIS — M722 Plantar fascial fibromatosis: Secondary | ICD-10-CM | POA: Diagnosis not present

## 2021-03-19 DIAGNOSIS — S20212S Contusion of left front wall of thorax, sequela: Secondary | ICD-10-CM | POA: Insufficient documentation

## 2021-03-19 DIAGNOSIS — Z0000 Encounter for general adult medical examination without abnormal findings: Secondary | ICD-10-CM | POA: Diagnosis not present

## 2021-03-19 LAB — CBC WITH DIFFERENTIAL/PLATELET
Basophils Absolute: 0 10*3/uL (ref 0.0–0.1)
Basophils Relative: 0.7 % (ref 0.0–3.0)
Eosinophils Absolute: 0.1 10*3/uL (ref 0.0–0.7)
Eosinophils Relative: 2.3 % (ref 0.0–5.0)
HCT: 40.2 % (ref 39.0–52.0)
Hemoglobin: 13.3 g/dL (ref 13.0–17.0)
Lymphocytes Relative: 23.8 % (ref 12.0–46.0)
Lymphs Abs: 1.1 10*3/uL (ref 0.7–4.0)
MCHC: 33 g/dL (ref 30.0–36.0)
MCV: 99.6 fl (ref 78.0–100.0)
Monocytes Absolute: 0.4 10*3/uL (ref 0.1–1.0)
Monocytes Relative: 8.1 % (ref 3.0–12.0)
Neutro Abs: 3 10*3/uL (ref 1.4–7.7)
Neutrophils Relative %: 65.1 % (ref 43.0–77.0)
Platelets: 143 10*3/uL — ABNORMAL LOW (ref 150.0–400.0)
RBC: 4.04 Mil/uL — ABNORMAL LOW (ref 4.22–5.81)
RDW: 12.8 % (ref 11.5–15.5)
WBC: 4.7 10*3/uL (ref 4.0–10.5)

## 2021-03-19 LAB — LIPID PANEL
Cholesterol: 134 mg/dL (ref 0–200)
HDL: 44.5 mg/dL (ref >39.00)
LDL Cholesterol: 60 mg/dL (ref 0–99)
NonHDL: 89.45
Total CHOL/HDL Ratio: 3
Triglycerides: 149 mg/dL (ref 0.0–149.0)
VLDL: 29.8 mg/dL (ref 0.0–40.0)

## 2021-03-19 LAB — URINALYSIS
Bilirubin Urine: NEGATIVE
Hgb urine dipstick: NEGATIVE
Ketones, ur: NEGATIVE
Leukocytes,Ua: NEGATIVE
Nitrite: NEGATIVE
Specific Gravity, Urine: 1.025 (ref 1.000–1.030)
Total Protein, Urine: NEGATIVE
Urine Glucose: NEGATIVE
Urobilinogen, UA: 0.2 (ref 0.0–1.0)
pH: 5.5 (ref 5.0–8.0)

## 2021-03-19 LAB — COMPREHENSIVE METABOLIC PANEL
ALT: 18 U/L (ref 0–53)
AST: 22 U/L (ref 0–37)
Albumin: 4.2 g/dL (ref 3.5–5.2)
Alkaline Phosphatase: 65 U/L (ref 39–117)
BUN: 15 mg/dL (ref 6–23)
CO2: 29 mEq/L (ref 19–32)
Calcium: 9.3 mg/dL (ref 8.4–10.5)
Chloride: 107 mEq/L (ref 96–112)
Creatinine, Ser: 1.1 mg/dL (ref 0.40–1.50)
GFR: 62.55 mL/min (ref >60.00)
Glucose, Bld: 93 mg/dL (ref 70–99)
Potassium: 4.5 mEq/L (ref 3.5–5.1)
Sodium: 142 mEq/L (ref 135–145)
Total Bilirubin: 0.8 mg/dL (ref 0.2–1.2)
Total Protein: 6.3 g/dL (ref 6.0–8.3)

## 2021-03-19 LAB — TSH: TSH: 2.23 u[IU]/mL (ref 0.35–5.50)

## 2021-03-19 LAB — PSA: PSA: 0 ng/mL — ABNORMAL LOW (ref 0.10–4.00)

## 2021-03-19 NOTE — Progress Notes (Signed)
Subjective:  Patient ID: Edwin Ramirez, male    DOB: 10-23-1940  Age: 80 y.o. MRN: [id number]  CC: Annual Exam (Flu shot)   HPI Edwin Ramirez presents for a well exam  Outpatient Medications Prior to Visit  Medication Sig Dispense Refill   b complex vitamins tablet Take 1 tablet by mouth daily. 100 tablet 3   Cholecalciferol (VITAMIN D3) 50 MCG (2000 UT) capsule Take 1 capsule (2,000 Units total) by mouth daily. 100 capsule 3   dicyclomine (BENTYL) 10 MG capsule TAKE 1 CAPSULE (10 MG TOTAL) BY MOUTH 4 (FOUR) TIMES DAILY - BEFORE MEALS AND AT BEDTIME. 100 capsule 3   HYDROcodone-acetaminophen (NORCO/VICODIN) 5-325 MG tablet Take 0.5-1 tablets by mouth every 6 (six) hours as needed for severe pain. 20 tablet 0   latanoprost (XALATAN) 0.005 % ophthalmic solution Place 1 drop into both eyes at bedtime.     LORazepam (ATIVAN) 1 MG tablet Take 1 tablet (1 mg total) by mouth every 8 (eight) hours as needed for anxiety. 90 tablet 1   neomycin-polymyxin-hydrocortisone (CORTISPORIN) OTIC solution Apply 1-2 drops to toe after soaking once daily 10 mL 0   omeprazole (PRILOSEC) 40 MG capsule Take 1 capsule (40 mg total) by mouth 2 (two) times daily. 180 capsule 3   rosuvastatin (CRESTOR) 10 MG tablet TAKE 1 TABLET BY MOUTH EVERY DAY 90 tablet 3   traMADol (ULTRAM) 50 MG tablet Take 1 tablet (50 mg total) by mouth every 6 (six) hours as needed for severe pain. 20 tablet 0   traZODone (DESYREL) 50 MG tablet TAKE 1-2 TABLETS (50-100 MG TOTAL) BY MOUTH AT BEDTIME AS NEEDED FOR SLEEP. 60 tablet 5   amoxicillin-clavulanate (AUGMENTIN) 875-125 MG tablet Take 1 tablet by mouth 2 (two) times daily. (Patient not taking: Reported on 03/19/2021) 28 tablet 0   benzonatate (TESSALON) 200 MG capsule Take 1 capsule (200 mg total) by mouth 2 (two) times daily as needed for cough. (Patient not taking: Reported on 03/19/2021) 20 capsule 0   No facility-administered medications prior to visit.     ROS: Review of Systems  Constitutional:  Negative for appetite change, fatigue and unexpected weight change.  HENT:  Negative for congestion, nosebleeds, sneezing, sore throat and trouble swallowing.   Eyes:  Negative for itching and visual disturbance.  Respiratory:  Negative for cough.   Cardiovascular:  Positive for chest pain. Negative for palpitations and leg swelling.  Gastrointestinal:  Negative for abdominal distention, blood in stool, diarrhea and nausea.  Genitourinary:  Negative for frequency and hematuria.  Musculoskeletal:  Positive for arthralgias. Negative for back pain, gait problem, joint swelling and neck pain.  Skin:  Negative for rash.  Neurological:  Negative for dizziness, tremors, speech difficulty and weakness.  Psychiatric/Behavioral:  Negative for agitation, dysphoric mood and sleep disturbance. The patient is not nervous/anxious.   Heel pain L chest wall - painful  Objective:  BP 130/62 (BP Location: Left Arm)   Pulse 65   Temp 98.1 F (36.7 C) (Oral)   Ht 5\' 7"  (1.702 m)   Wt 159 lb 6.4 oz (72.3 kg)   SpO2 96%   BMI 24.97 kg/m   BP Readings from Last 3 Encounters:  03/19/21 130/62  03/02/21 135/71  10/26/20 130/62    Wt Readings from Last 3 Encounters:  03/19/21 159 lb 6.4 oz (72.3 kg)  10/26/20 155 lb 3.2 oz (70.4 kg)  08/01/20 156 lb 9.6 oz (71 kg)    Physical Exam Constitutional:  General: He is not in acute distress.    Appearance: He is well-developed.     Comments: NAD  Eyes:     Conjunctiva/sclera: Conjunctivae normal.     Pupils: Pupils are equal, round, and reactive to light.  Neck:     Thyroid: No thyromegaly.     Vascular: No JVD.  Cardiovascular:     Rate and Rhythm: Normal rate and regular rhythm.     Heart sounds: Normal heart sounds. No murmur heard.   No friction rub. No gallop.  Pulmonary:     Effort: Pulmonary effort is normal. No respiratory distress.     Breath sounds: Normal breath sounds. No wheezing  or rales.  Chest:     Chest wall: No tenderness.  Abdominal:     General: Bowel sounds are normal. There is no distension.     Palpations: Abdomen is soft. There is no mass.     Tenderness: There is no abdominal tenderness. There is no guarding or rebound.  Genitourinary:    Rectum: Normal. Guaiac result negative.  Musculoskeletal:        General: Tenderness present. Normal range of motion.     Cervical back: Normal range of motion.  Lymphadenopathy:     Cervical: No cervical adenopathy.  Skin:    General: Skin is warm and dry.     Findings: No rash.  Neurological:     Mental Status: He is alert and oriented to person, place, and time.     Cranial Nerves: No cranial nerve deficit.     Motor: No abnormal muscle tone.     Coordination: Coordination normal.     Gait: Gait normal.     Deep Tendon Reflexes: Reflexes are normal and symmetric.  Psychiatric:        Behavior: Behavior normal.        Thought Content: Thought content normal.        Judgment: Judgment normal.   L ribs w/pain Lab Results  Component Value Date   WBC 4.2 08/01/2020   HGB 12.8 (L) 08/01/2020   HCT 38.0 (L) 08/01/2020   PLT 121.0 (L) 08/01/2020   GLUCOSE 117 (H) 08/01/2020   CHOL 130 03/15/2020   TRIG 142.0 03/15/2020   HDL 38.70 (L) 03/15/2020   LDLDIRECT 78.8 02/05/2011   LDLCALC 63 03/15/2020   ALT 15 08/01/2020   AST 19 08/01/2020   NA 140 08/01/2020   K 4.2 08/01/2020   CL 107 08/01/2020   CREATININE 0.96 08/01/2020   BUN 12 08/01/2020   CO2 27 08/01/2020   TSH 3.05 03/15/2020   PSA 0.00 (L) 03/15/2020   HGBA1C 5.7 03/07/2014    DG Ribs Unilateral W/Chest Left  Result Date: 03/02/2021 CLINICAL DATA:  Fall on left side EXAM: LEFT RIBS AND CHEST - 3+ VIEW COMPARISON:  08/01/2020 FINDINGS: Single-view chest demonstrates no focal opacity or pleural effusion. Normal cardiomediastinal silhouette. No visible pneumothorax. No definite acute displaced rib fracture is seen. Possible age  indeterminate left seventh lateral rib fracture, favor chronic fracture. IMPRESSION: 1. No visible pneumothorax or pleural effusion. 2. No definite acute displaced rib fracture seen. Probable chronic fracture deformity of left seventh rib Electronically Signed   By: Donavan Foil M.D.   On: 03/02/2021 17:10    Assessment & Plan:   Problem List Items Addressed This Visit     Plantar fasciitis    Spenco inserts, HOKA Bondi EE      Well adult exam     We  discussed age appropriate health related issues, including available/recomended screening tests and vaccinations. Labs were ordered to be later reviewed . All questions were answered. We discussed one or more of the following - seat belt use, use of sunscreen/sun exposure exercise, fall risk reduction, second hand smoke exposure, firearm use and storage, seat belt use, a need for adhering to healthy diet and exercise. Labs were ordered.  All questions were answered.        Other Visit Diagnoses     Needs flu shot    -  Primary   Relevant Orders   Flu Vaccine QUAD High Dose(Fluad) (Completed)         Follow-up: Return in about 3 months (around 06/19/2021) for a follow-up visit.  Walker Kehr, MD

## 2021-03-19 NOTE — Patient Instructions (Addendum)
Spenco inserts, HOKA Bondi EE Rib belt

## 2021-03-19 NOTE — Assessment & Plan Note (Signed)
Use a rib belt

## 2021-03-19 NOTE — Addendum Note (Signed)
Addended by: Boris Lown B on: 03/19/2021 10:30 AM   Modules accepted: Orders

## 2021-03-19 NOTE — Assessment & Plan Note (Signed)
Spenco inserts, HOKA Bondi EE

## 2021-03-19 NOTE — Assessment & Plan Note (Signed)

## 2021-03-20 ENCOUNTER — Other Ambulatory Visit: Payer: Self-pay | Admitting: Internal Medicine

## 2021-03-25 ENCOUNTER — Other Ambulatory Visit: Payer: Self-pay | Admitting: Internal Medicine

## 2021-03-30 ENCOUNTER — Other Ambulatory Visit: Payer: Self-pay | Admitting: Internal Medicine

## 2021-04-13 ENCOUNTER — Other Ambulatory Visit: Payer: Self-pay | Admitting: Internal Medicine

## 2021-05-01 DIAGNOSIS — Z85828 Personal history of other malignant neoplasm of skin: Secondary | ICD-10-CM | POA: Diagnosis not present

## 2021-05-01 DIAGNOSIS — L821 Other seborrheic keratosis: Secondary | ICD-10-CM | POA: Diagnosis not present

## 2021-05-01 DIAGNOSIS — D692 Other nonthrombocytopenic purpura: Secondary | ICD-10-CM | POA: Diagnosis not present

## 2021-05-01 DIAGNOSIS — L570 Actinic keratosis: Secondary | ICD-10-CM | POA: Diagnosis not present

## 2021-05-01 DIAGNOSIS — D1801 Hemangioma of skin and subcutaneous tissue: Secondary | ICD-10-CM | POA: Diagnosis not present

## 2021-05-01 DIAGNOSIS — L853 Xerosis cutis: Secondary | ICD-10-CM | POA: Diagnosis not present

## 2021-06-13 DIAGNOSIS — H35373 Puckering of macula, bilateral: Secondary | ICD-10-CM | POA: Diagnosis not present

## 2021-06-13 DIAGNOSIS — H0102B Squamous blepharitis left eye, upper and lower eyelids: Secondary | ICD-10-CM | POA: Diagnosis not present

## 2021-06-13 DIAGNOSIS — H0102A Squamous blepharitis right eye, upper and lower eyelids: Secondary | ICD-10-CM | POA: Diagnosis not present

## 2021-06-13 DIAGNOSIS — H401131 Primary open-angle glaucoma, bilateral, mild stage: Secondary | ICD-10-CM | POA: Diagnosis not present

## 2021-06-19 ENCOUNTER — Telehealth: Payer: Self-pay | Admitting: Internal Medicine

## 2021-06-19 MED ORDER — LORAZEPAM 1 MG PO TABS: 1.0000 mg | ORAL_TABLET | Freq: Three times a day (TID) | ORAL | 1 refills | Status: DC | PRN

## 2021-06-19 NOTE — Telephone Encounter (Signed)
Ok Thx 

## 2021-06-19 NOTE — Telephone Encounter (Signed)
1.Medication Requested: LORazepam (ATIVAN) 1 MG tablet  2. Pharmacy (Name, [address], [place]): [place], [place]  3. On Med List: yes  4. Last Visit with PCP: 03-19-2021  5. Next visit date with PCP: 10-01-2021   Agent: Please be advised that RX refills may take up to 3 business days. We ask that you follow-up with your pharmacy.

## 2021-06-29 ENCOUNTER — Other Ambulatory Visit: Payer: Self-pay

## 2021-06-29 ENCOUNTER — Emergency Department (HOSPITAL_COMMUNITY): Payer: Medicare Other

## 2021-06-29 ENCOUNTER — Emergency Department (HOSPITAL_COMMUNITY)
Admission: EM | Admit: 2021-06-29 | Discharge: 2021-06-29 | Disposition: A | Discharge status: Home / Self Care | Payer: Medicare Other | Attending: Emergency Medicine | Admitting: Emergency Medicine

## 2021-06-29 ENCOUNTER — Encounter (HOSPITAL_COMMUNITY): Payer: Self-pay

## 2021-06-29 DIAGNOSIS — R0789 Other chest pain: Secondary | ICD-10-CM | POA: Diagnosis not present

## 2021-06-29 DIAGNOSIS — M25512 Pain in left shoulder: Secondary | ICD-10-CM | POA: Diagnosis not present

## 2021-06-29 DIAGNOSIS — R12 Heartburn: Secondary | ICD-10-CM | POA: Diagnosis not present

## 2021-06-29 LAB — BASIC METABOLIC PANEL
Anion gap: 7 (ref 5–15)
BUN: 14 mg/dL (ref 8–23)
CO2: 28 mmol/L (ref 22–32)
Calcium: 9.5 mg/dL (ref 8.9–10.3)
Chloride: 104 mmol/L (ref 98–111)
Creatinine, Ser: 1.08 mg/dL (ref 0.61–1.24)
GFR, Estimated: >60 mL/min (ref >60)
Glucose, Bld: 91 mg/dL (ref 70–99)
Potassium: 4.2 mmol/L (ref 3.5–5.1)
Sodium: 139 mmol/L (ref 135–145)

## 2021-06-29 LAB — TROPONIN I (HIGH SENSITIVITY)
Troponin I (High Sensitivity): 5 ng/L (ref <18)
Troponin I (High Sensitivity): 5 ng/L (ref <18)

## 2021-06-29 LAB — CBC
HCT: 43.8 % (ref 39.0–52.0)
Hemoglobin: 13.9 g/dL (ref 13.0–17.0)
MCH: 32.1 pg (ref 26.0–34.0)
MCHC: 31.7 g/dL (ref 30.0–36.0)
MCV: 101.2 fL — ABNORMAL HIGH (ref 80.0–100.0)
Platelets: 163 10*3/uL (ref 150–400)
RBC: 4.33 MIL/uL (ref 4.22–5.81)
RDW: 12 % (ref 11.5–15.5)
WBC: 5 10*3/uL (ref 4.0–10.5)
nRBC: 0 % (ref 0.0–0.2)

## 2021-06-29 NOTE — ED Provider Notes (Signed)
[facility] EMERGENCY DEPARTMENT Provider Note   CSN: [id number] Arrival date & time: 06/29/21  1143     History  Chief Complaint  Patient presents with   Chest Pain    Edwin Ramirez is a 81 y.o. male.  The history is provided by the patient, the spouse and medical records. No language interpreter was used.  Chest Pain Pain location:  L lateral chest Pain quality: burning   Pain radiates to:  L shoulder Pain severity:  Moderate Onset quality:  Gradual Duration:  3 days Timing:  Sporadic Progression:  Waxing and waning Chronicity:  Recurrent Relieved by:  Certain positions Worsened by:  Movement and certain positions Ineffective treatments:  None tried Associated symptoms: heartburn   Associated symptoms: no abdominal pain, no altered mental status, no back pain, no claudication, no cough, no diaphoresis, no dizziness, no dysphagia, no fatigue, no fever, no headache, no lower extremity edema, no nausea, no near-syncope, no numbness, no palpitations, no shortness of breath, no syncope, no vomiting and no weakness   Risk factors: no coronary artery disease       Home Medications Prior to Admission medications   Medication Sig Start Date End Date Taking? Authorizing Provider  b complex vitamins tablet Take 1 tablet by mouth daily. 03/09/19   Plotnikov, Evie Lacks, MD  Cholecalciferol (VITAMIN D3) 50 MCG (2000 UT) capsule Take 1 capsule (2,000 Units total) by mouth daily. 03/09/19   Plotnikov, Evie Lacks, MD  dicyclomine (BENTYL) 10 MG capsule TAKE 1 CAPSULE (10 MG TOTAL) BY MOUTH 4 (FOUR) TIMES DAILY - BEFORE MEALS AND AT BEDTIME. 01/01/21   Plotnikov, Evie Lacks, MD  HYDROcodone-acetaminophen (NORCO/VICODIN) 5-325 MG tablet Take 0.5-1 tablets by mouth every 6 (six) hours as needed for severe pain. 08/01/20 08/01/21  Plotnikov, Evie Lacks, MD  latanoprost (XALATAN) 0.005 % ophthalmic solution Place 1 drop into both eyes at bedtime.    [provider]   LORazepam (ATIVAN) 1 MG tablet Take 1 tablet (1 mg total) by mouth every 8 (eight) hours as needed. for anxiety 06/19/21   Plotnikov, Evie Lacks, MD  neomycin-polymyxin-hydrocortisone (CORTISPORIN) OTIC solution Apply 1-2 drops to toe after soaking once daily 12/28/20   Landis Martins, DPM  omeprazole (PRILOSEC) 40 MG capsule TAKE 1 CAPSULE BY MOUTH TWICE A DAY 03/26/21   Plotnikov, Evie Lacks, MD  rosuvastatin (CRESTOR) 10 MG tablet TAKE 1 TABLET BY MOUTH EVERY DAY 03/15/21   Plotnikov, Evie Lacks, MD  traMADol (ULTRAM) 50 MG tablet Take 1 tablet (50 mg total) by mouth every 6 (six) hours as needed for severe pain. 05/10/20   Plotnikov, Evie Lacks, MD  traZODone (DESYREL) 50 MG tablet TAKE 1-2 TABLETS BY MOUTH AT BEDTIME AS NEEDED FOR SLEEP. 04/13/21   Plotnikov, Evie Lacks, MD      Allergies    Codeine, Nabumetone, Pneumovax [pneumococcal polysaccharide vaccine], and Aspirin    Review of Systems   Review of Systems  Constitutional:  Negative for chills, diaphoresis, fatigue and fever.  HENT:  Negative for congestion and trouble swallowing.   Eyes:  Negative for visual disturbance.  Respiratory:  Negative for cough, chest tightness, shortness of breath and wheezing.   Cardiovascular:  Positive for chest pain. Negative for palpitations, claudication, leg swelling, syncope and near-syncope.  Gastrointestinal:  Positive for heartburn. Negative for abdominal pain, constipation, diarrhea, nausea and vomiting.  Genitourinary:  Negative for flank pain.  Musculoskeletal:  Negative for back pain, neck pain and neck stiffness.  Skin:  Negative for rash and wound.  Neurological:  Negative for dizziness, weakness, light-headedness, numbness and headaches.  Psychiatric/Behavioral:  Negative for agitation and confusion.   All other systems reviewed and are negative.  Physical Exam Updated Vital Signs BP (!) 142/70 (BP Location: Right Arm)    Pulse 62    Temp 97.8 F (36.6 C) (Oral)    Resp 16    Ht 5\' 7"   (1.702 m)    Wt 68.9 kg    SpO2 98%    BMI 23.81 kg/m  Physical Exam Vitals and nursing note reviewed.  Constitutional:      General: He is not in acute distress.    Appearance: He is well-developed. He is not ill-appearing, toxic-appearing or diaphoretic.  HENT:     Head: Normocephalic and atraumatic.     Nose: Nose normal.     Mouth/Throat:     Mouth: Mucous membranes are moist.  Eyes:     Extraocular Movements: Extraocular movements intact.     Conjunctiva/sclera: Conjunctivae normal.     Pupils: Pupils are equal, round, and reactive to light.  Cardiovascular:     Rate and Rhythm: Normal rate and regular rhythm.     Pulses: Normal pulses.     Heart sounds: No murmur heard. Pulmonary:     Effort: Pulmonary effort is normal. No respiratory distress.     Breath sounds: Normal breath sounds. No wheezing, rhonchi or rales.  Chest:     Chest wall: No tenderness.  Abdominal:     General: Abdomen is flat.     Palpations: Abdomen is soft.     Tenderness: There is no abdominal tenderness. There is no guarding or rebound.  Musculoskeletal:        General: Tenderness (L shoulder) present. No swelling.     Cervical back: Neck supple. No tenderness.     Right lower leg: No edema.     Left lower leg: No edema.  Skin:    General: Skin is warm and dry.     Capillary Refill: Capillary refill takes less than 2 seconds.     Findings: No erythema or rash.  Neurological:     General: No focal deficit present.     Mental Status: He is alert.     Sensory: No sensory deficit.     Motor: No weakness.  Psychiatric:        Mood and Affect: Mood normal.    ED Results / Procedures / Treatments   Labs (all labs ordered are listed, but only abnormal results are displayed) Labs Reviewed  CBC - Abnormal; Notable for the following components:      Result Value   MCV 101.2 (*)    All other components within normal limits  BASIC METABOLIC PANEL  TROPONIN I (HIGH SENSITIVITY)  TROPONIN I (HIGH  SENSITIVITY)    EKG EKG Interpretation  Date/Time:  Friday June 29 2021 11:50:00 EST Ventricular Rate:  66 PR Interval:  132 QRS Duration: 84 QT Interval:  390 QTC Calculation: 408 R Axis:   30 Text Interpretation: Normal sinus rhythm Normal ECG When compared to prior, similar appearance. No STEMI Confirmed by Antony Blackbird [phone]) on 06/29/2021 3:05:31 PM  Radiology DG Chest 2 View  Result Date: 06/29/2021 CLINICAL DATA:  Chest pain starting Thursday. EXAM: CHEST - 2 VIEW COMPARISON:  March 02, 2021 FINDINGS: The heart size and mediastinal contours are within normal limits. Both lungs are clear. The visualized skeletal structures are stable. Scoliosis of spine is  noted. Prior cholecystectomy clips are identified in the right upper quadrant. IMPRESSION: No active cardiopulmonary disease. Electronically Signed   By: Abelardo Diesel M.D.   On: 06/29/2021 12:27    Procedures Procedures    Medications Ordered in ED Medications - No data to display  ED Course/ Medical Decision Making/ A&P                           Medical Decision Making Amount and/or Complexity of Data Reviewed Labs: ordered. Radiology: ordered.    SHARONE ALMOND is a 81 y.o. male with a past medical history significant for previous cholecystectomy, chronic severe GERD, chronic left shoulder pain management orthopedics, prostate cancer, diverticulosis, and previous esophageal stricture who presents with chest pain and shoulder pain.  According to patient, for the last 3 days he has had increased pain in his left lateral chest and his left shoulder.  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  He does say that he has been increasing his exercise for the last week or so by doing more walking at home and thinks this may have exacerbated his symptoms.  He is also saying that he is having  burning pain in the left lateral chest that feels similar but slightly different location to his typical reflux type pains.  He reports he is on Prilosec for many years and is well versed in this management.  He denies any rashes to suggest shingles and denies any trauma.  He reports symptoms are not exertional and nonpleuritic.  Denies any leg pain or leg swelling and denies any history of DVT or PE.  Denies any nausea, vomiting, diaphoresis, lightheadedness, or shortness of breath.  No infectious type symptoms with fevers, chills, or cough.  Denies hemoptysis.  Denies any other complaints whatsoever.  Reports the pain is moderate but has improved since sitting up and being here in the emergency department.  He does say that laying flat and laying on his left side stents to make his burning left chest discomfort worse similar to his reflux type pains.  On exam, lungs are clear and chest is nontender.  Abdomen is nontender.  No murmur.  Good pulses in extremities.  Left shoulder is slightly tender to palpation but otherwise intact grip strength, sensation, and pulses.  Legs nontender and nonedematous.  Patient well-appearing with reassuring vital signs initially.  EKG per similar to prior with no STEMI.  Had a long shared decision conversation with patient and family.  We agreed that given his lack of DVT symptoms and lack of history of thromboembolic disease with his description today, we have a low suspicion for PE as a cause of his symptoms.  Clinically I suspect his pain is either coming from his shoulder in a musculoskeletal standpoint due to his increase in ambulation swinging his arm exacerbating his chronic shoulder pains versus evolution and worsening of his chronic reflux type pains given the burning discomfort.  He reports that he has not seen his GI doctor in many years and has just learned to deal with his chronic abdominal pains, chest pain, and burning from his reflux.  Given the patient's  well appearance, we agreed that if his delta troponin is negative he is likely safe for discharge home to see his orthopedics on Monday and follow-up with both his  PCP and his GI team.  Initial work-up showed negative troponin, reassuring chest x-ray, and his CBC and BMP were reassuring.  Abdomen is completely nontender and his discomfort is not in the left upper quadrant so lower suspicion for a pancreatitis or other intra-abdominal pathology.  Anticipate delta troponin and if is reassuring, dissipate discharge home.  3:58 PM Second troponin is negative.  It is unchanged.  He is able to ambulate around and feels normal.  He would like to go home.  He will follow-up with both his GI team, his PCP, and his orthopedic team and understands extremities return precautions.  We again talked about doing further work-up but given his reassuring exam, history, and work-up thus far, we agree that he is stable for discharge home at this time.  Ouida Sills return precautions and were discharged in good condition.         Final Clinical Impression(s) / ED Diagnoses Final diagnoses:  Left shoulder pain, unspecified chronicity  Atypical chest pain    Rx / DC Orders ED Discharge Orders     None       Clinical Impression: 1. Left shoulder pain, unspecified chronicity   2. Atypical chest pain     Disposition: Discharge  Condition: Good  I have discussed the results, Dx and Tx plan with the pt(& family if present). He/she/they expressed understanding and agree(s) with the plan. Discharge instructions discussed at great length. Strict return precautions discussed and pt &/or family have verbalized understanding of the instructions. No further questions at time of discharge.    New Prescriptions   No medications on file    Follow Up: Plotnikov, Evie Lacks, MD [place] [address] [phone]     [facility] [address] [place]  [id number] [phone]    your orthoedics team     [place] [address] [id number] mc [place]        Evonna Stoltz, Gwenyth Allegra, MD 06/29/21 [phone]

## 2021-06-29 NOTE — Discharge Instructions (Signed)
Your history, exam, work-up today are suggestive of a more musculoskeletal versus GI cause of your discomfort today.  Your work-up was overall reassuring with negative cardiac enzymes both times we checked them.  The x-ray was reassuring and your exam was reassuring.  Please go to the appointment on Monday with your orthopedic team and also call to schedule an appointment and close follow-up with your GI team.  As we discussed, your burning symptoms may be an evolution of your chronic GERD and reflux troubles.  Please follow-up with your primary doctor and if any symptoms change or worsen acutely as we discussed, please return to the nearest emergency department immediately.

## 2021-06-29 NOTE — ED Triage Notes (Signed)
Pt arrived POV from home c/o CP that started Thursday. Pt states he has GERD but the pain is worse and more sharp than what it would be. Pt states the sharp pain is more on the left. Pt is also having left arm pain but states he has a torn rotator cuff and was due to get a steroid shot tomorrow.

## 2021-06-29 NOTE — ED Provider Triage Note (Signed)
Emergency Medicine Provider Triage Evaluation Note  Edwin Ramirez , a 81 y.o. male  was evaluated in triage.  Pt complains of left chest pain  Review of Systems  Positive: Left chest pain Negative: cough  Physical Exam  BP (!) 142/70 (BP Location: Right Arm)    Pulse 62    Temp 97.8 F (36.6 C) (Oral)    Resp 16    Ht 5\' 7"  (1.702 m)    Wt 68.9 kg    SpO2 98%    BMI 23.81 kg/m  Gen:   Awake, no distress   Resp:  Normal effort  MSK:   Moves extremities without difficulty  Other:    Medical Decision Making  Medically screening exam initiated at 12:51 PM.  Appropriate orders placed.  [name] was informed that the remainder of the evaluation will be completed by another provider, this initial triage assessment does not replace that evaluation, and the importance of remaining in the ED until their evaluation is complete.     Fransico Meadow, [place] 06/29/21 1252

## 2021-07-02 DIAGNOSIS — M19012 Primary osteoarthritis, left shoulder: Secondary | ICD-10-CM | POA: Diagnosis not present

## 2021-09-20 ENCOUNTER — Other Ambulatory Visit: Payer: Self-pay | Admitting: Internal Medicine

## 2021-09-24 ENCOUNTER — Other Ambulatory Visit: Payer: Self-pay | Admitting: *Deleted

## 2021-09-24 NOTE — Telephone Encounter (Signed)
Rec'd PA for Dicyclomine completed via cover-my-meds w/ Key: [id number] - Rx #: [id number]. Waiting on insurance determination.Marland KitchenJohny Chess ?

## 2021-09-24 NOTE — Telephone Encounter (Signed)
Rec'd approval back on Dicylomine. Effective from 09/24/2021 through 09/25/2022. Faxed info to [facility]of.Marland KitchenJohny Chess ?

## 2021-10-01 ENCOUNTER — Encounter: Payer: Self-pay | Admitting: Internal Medicine

## 2021-10-01 ENCOUNTER — Ambulatory Visit (INDEPENDENT_AMBULATORY_CARE_PROVIDER_SITE_OTHER): Payer: Medicare Other | Admitting: Internal Medicine

## 2021-10-01 DIAGNOSIS — I2510 Atherosclerotic heart disease of native coronary artery without angina pectoris: Secondary | ICD-10-CM

## 2021-10-01 DIAGNOSIS — I2583 Coronary atherosclerosis due to lipid rich plaque: Secondary | ICD-10-CM

## 2021-10-01 DIAGNOSIS — M25512 Pain in left shoulder: Secondary | ICD-10-CM

## 2021-10-01 DIAGNOSIS — M791 Myalgia, unspecified site: Secondary | ICD-10-CM | POA: Diagnosis not present

## 2021-10-01 DIAGNOSIS — E785 Hyperlipidemia, unspecified: Secondary | ICD-10-CM

## 2021-10-01 DIAGNOSIS — G8929 Other chronic pain: Secondary | ICD-10-CM

## 2021-10-01 DIAGNOSIS — J209 Acute bronchitis, unspecified: Secondary | ICD-10-CM

## 2021-10-01 LAB — COMPREHENSIVE METABOLIC PANEL
ALT: 26 U/L (ref 0–53)
AST: 26 U/L (ref 0–37)
Albumin: 4.1 g/dL (ref 3.5–5.2)
Alkaline Phosphatase: 75 U/L (ref 39–117)
BUN: 17 mg/dL (ref 6–23)
CO2: 31 mEq/L (ref 19–32)
Calcium: 9.4 mg/dL (ref 8.4–10.5)
Chloride: 105 mEq/L (ref 96–112)
Creatinine, Ser: 1.13 mg/dL (ref 0.40–1.50)
GFR: 60.34 mL/min (ref >60.00)
Glucose, Bld: 100 mg/dL — ABNORMAL HIGH (ref 70–99)
Potassium: 4.7 mEq/L (ref 3.5–5.1)
Sodium: 141 mEq/L (ref 135–145)
Total Bilirubin: 0.9 mg/dL (ref 0.2–1.2)
Total Protein: 6.8 g/dL (ref 6.0–8.3)

## 2021-10-01 LAB — CK: Total CK: 101 U/L (ref 7–232)

## 2021-10-01 LAB — MAGNESIUM: Magnesium: 2.6 mg/dL — ABNORMAL HIGH (ref 1.5–2.5)

## 2021-10-01 MED ORDER — AZITHROMYCIN 250 MG PO TABS: ORAL_TABLET | ORAL | 0 refills | Status: DC

## 2021-10-01 NOTE — Assessment & Plan Note (Signed)
Blue-Emu cream was recommended to use 2-3 times a day ? ?

## 2021-10-01 NOTE — Assessment & Plan Note (Signed)
Zpack

## 2021-10-01 NOTE — Assessment & Plan Note (Signed)
Cramps - new ?Magnesium oil spray for cramps ?Hold Crestor x 1 week ?

## 2021-10-01 NOTE — Patient Instructions (Addendum)
Blue-Emu cream --use 2-3 times a day ?Tylenol PM at night ?Magnesium oil spray for cramps ?Hold Crestor x 1 week ? ? ?

## 2021-10-01 NOTE — Assessment & Plan Note (Addendum)
No CP ?Cramps - new ?Magnesium oil spray for cramps ?Hold Crestor x 1 week ?

## 2021-10-01 NOTE — Progress Notes (Signed)
? ?Subjective:  ?Patient ID: Edwin Ramirez, male    DOB: 1940-09-26  Age: 81 y.o. MRN: [id number] ? ?CC: No chief complaint on file. ? ? ?HPI ?Edwin Ramirez presents for LBP, GERD, dyslipidemia ?C/o OA ?C/o leg cramps ?C/o bronchitis  x 1 week ? ?Outpatient Medications Prior to Visit  ?Medication Sig Dispense Refill  ? b complex vitamins tablet Take 1 tablet by mouth daily. 100 tablet 3  ? Cholecalciferol (VITAMIN D3) 50 MCG (2000 UT) capsule Take 1 capsule (2,000 Units total) by mouth daily. 100 capsule 3  ? dicyclomine (BENTYL) 10 MG capsule TAKE 1 CAPSULE (10 MG TOTAL) BY MOUTH 4 (FOUR) TIMES DAILY - BEFORE MEALS AND AT BEDTIME. 100 capsule 2  ? latanoprost (XALATAN) 0.005 % ophthalmic solution Place 1 drop into both eyes at bedtime.    ? LORazepam (ATIVAN) 1 MG tablet Take 1 tablet (1 mg total) by mouth every 8 (eight) hours as needed. for anxiety 90 tablet 1  ? neomycin-polymyxin-hydrocortisone (CORTISPORIN) OTIC solution Apply 1-2 drops to toe after soaking once daily 10 mL 0  ? omeprazole (PRILOSEC) 40 MG capsule TAKE 1 CAPSULE BY MOUTH TWICE A DAY 180 capsule 3  ? rosuvastatin (CRESTOR) 10 MG tablet TAKE 1 TABLET BY MOUTH EVERY DAY 90 tablet 3  ? traMADol (ULTRAM) 50 MG tablet Take 1 tablet (50 mg total) by mouth every 6 (six) hours as needed for severe pain. 20 tablet 0  ? traZODone (DESYREL) 50 MG tablet TAKE 1-2 TABLETS BY MOUTH AT BEDTIME AS NEEDED FOR SLEEP. 180 tablet 3  ? ?No facility-administered medications prior to visit.  ? ? ?ROS: ?Review of Systems  ?Constitutional:  Negative for appetite change, fatigue and unexpected weight change.  ?HENT:  Negative for congestion, nosebleeds, sneezing, sore throat and trouble swallowing.   ?Eyes:  Negative for itching and visual disturbance.  ?Respiratory:  Negative for cough.   ?Cardiovascular:  Negative for chest pain, palpitations and leg swelling.  ?Gastrointestinal:  Negative for abdominal distention, blood in stool, diarrhea and nausea.   ?Genitourinary:  Negative for frequency and hematuria.  ?Musculoskeletal:  Positive for arthralgias, gait problem and myalgias. Negative for back pain, joint swelling and neck pain.  ?Skin:  Negative for rash.  ?Neurological:  Negative for dizziness, tremors, speech difficulty and weakness.  ?Psychiatric/Behavioral:  Negative for agitation, dysphoric mood, sleep disturbance and suicidal ideas. The patient is not nervous/anxious.   ? ?Objective:  ?BP (!) 110/58 (BP Location: Left Arm, Patient Position: Sitting, Cuff Size: Large)   Pulse (!) 54   Temp 97.6 ?F (36.4 ?C) (Oral)   Ht '5\' 7"'$  (1.702 m)   Wt 155 lb (70.3 kg)   SpO2 93%   BMI 24.28 kg/m?  ? ?BP Readings from Last 3 Encounters:  ?10/01/21 (!) 110/58  ?06/29/21 (!) 152/62  ?03/19/21 130/62  ? ? ?Wt Readings from Last 3 Encounters:  ?10/01/21 155 lb (70.3 kg)  ?06/29/21 152 lb (68.9 kg)  ?03/19/21 159 lb 6.4 oz (72.3 kg)  ? ? ?Physical Exam ?Constitutional:   ?   General: He is not in acute distress. ?   Appearance: Normal appearance. He is well-developed.  ?   Comments: NAD  ?Eyes:  ?   Conjunctiva/sclera: Conjunctivae normal.  ?   Pupils: Pupils are equal, round, and reactive to light.  ?Neck:  ?   Thyroid: No thyromegaly.  ?   Vascular: No JVD.  ?Cardiovascular:  ?   Rate and Rhythm: Normal rate and regular  rhythm.  ?   Heart sounds: Normal heart sounds. No murmur heard. ?  No friction rub. No gallop.  ?Pulmonary:  ?   Effort: Pulmonary effort is normal. No respiratory distress.  ?   Breath sounds: Normal breath sounds. No wheezing or rales.  ?Chest:  ?   Chest wall: No tenderness.  ?Abdominal:  ?   General: Bowel sounds are normal. There is no distension.  ?   Palpations: Abdomen is soft. There is no mass.  ?   Tenderness: There is no abdominal tenderness. There is no guarding or rebound.  ?Musculoskeletal:     ?   General: No tenderness. Normal range of motion.  ?   Cervical back: Normal range of motion.  ?Lymphadenopathy:  ?   Cervical: No cervical  adenopathy.  ?Skin: ?   General: Skin is warm and dry.  ?   Findings: No rash.  ?Neurological:  ?   Mental Status: He is alert and oriented to person, place, and time.  ?   Cranial Nerves: No cranial nerve deficit.  ?   Motor: No abnormal muscle tone.  ?   Coordination: Coordination abnormal.  ?   Gait: Gait normal.  ?   Deep Tendon Reflexes: Reflexes are normal and symmetric.  ?Psychiatric:     ?   Behavior: Behavior normal.     ?   Thought Content: Thought content normal.     ?   Judgment: Judgment normal.  ? ? ?Lab Results  ?Component Value Date  ? WBC 5.0 06/29/2021  ? HGB 13.9 06/29/2021  ? HCT 43.8 06/29/2021  ? PLT 163 06/29/2021  ? GLUCOSE 91 06/29/2021  ? CHOL 134 03/19/2021  ? TRIG 149.0 03/19/2021  ? HDL 44.50 03/19/2021  ? LDLDIRECT 78.8 02/05/2011  ? [place] 60 03/19/2021  ? ALT 18 03/19/2021  ? AST 22 03/19/2021  ? NA 139 06/29/2021  ? K 4.2 06/29/2021  ? CL 104 06/29/2021  ? CREATININE 1.08 06/29/2021  ? BUN 14 06/29/2021  ? CO2 28 06/29/2021  ? TSH 2.23 03/19/2021  ? PSA 0.00 (L) 03/19/2021  ? HGBA1C 5.7 03/07/2014  ? ? ?DG Chest 2 View ? ?Result Date: 06/29/2021 ?CLINICAL DATA:  Chest pain starting Thursday. EXAM: CHEST - 2 VIEW COMPARISON:  March 02, 2021 FINDINGS: The heart size and mediastinal contours are within normal limits. Both lungs are clear. The visualized skeletal structures are stable. Scoliosis of spine is noted. Prior cholecystectomy clips are identified in the right upper quadrant. IMPRESSION: No active cardiopulmonary disease. Electronically Signed   By: Abelardo Diesel M.D.   On: 06/29/2021 12:27  ? ? ?Assessment & Plan:  ? ?Problem List Items Addressed This Visit   ? ? Dyslipidemia  ?  Cramps - new ?Magnesium oil spray for cramps ?Hold Crestor x 1 week ?  ?  ? CAD (coronary artery disease)  ?  No CP ?Cramps - new ?Magnesium oil spray for cramps ?Hold Crestor x 1 week ?  ?  ? Shoulder pain  ?  Blue-Emu cream was recommended to use 2-3 times a day ? ? ?  ?  ? Myalgia  ?   New ?Blue-Emu cream --use 2-3 times a day ?Tylenol PM at night ?Magnesium oil spray for cramps ?Hold Crestor x 1 week ?  ?  ? Relevant Orders  ? Comprehensive metabolic panel  ? Magnesium  ? CK  ? Acute bronchitis  ?  Zpack ? ?  ?  ?  ? ? ?  No orders of the defined types were placed in this encounter. ?  ? ? ?Follow-up: Return in about 6 months (around 04/02/2022) for Wellness Exam. ? ?Walker Kehr, MD ?

## 2021-10-01 NOTE — Assessment & Plan Note (Signed)
New ?Blue-Emu cream --use 2-3 times a day ?Tylenol PM at night ?Magnesium oil spray for cramps ?Hold Crestor x 1 week ?

## 2021-10-04 ENCOUNTER — Ambulatory Visit (INDEPENDENT_AMBULATORY_CARE_PROVIDER_SITE_OTHER): Payer: Medicare Other | Admitting: Internal Medicine

## 2021-10-04 ENCOUNTER — Encounter: Payer: Self-pay | Admitting: Internal Medicine

## 2021-10-04 VITALS — BP 122/68 | HR 65 | Resp 18 | Ht 67.0 in | Wt 155.4 lb

## 2021-10-04 DIAGNOSIS — N4889 Other specified disorders of penis: Secondary | ICD-10-CM

## 2021-10-04 MED ORDER — DOXYCYCLINE HYCLATE 100 MG PO TABS: 100.0000 mg | ORAL_TABLET | Freq: Two times a day (BID) | ORAL | 0 refills | Status: DC

## 2021-10-04 NOTE — Progress Notes (Signed)
Patient ID: Edwin Ramirez, male   DOB: [birth year], 81 y.o.   MRN: [id number] ? ? ? ?    Chief Complaint: follow up penile mass now red, swelling x 2 days ? ?     HPI:  Edwin Ramirez is a 81 y.o. male here with c/o chronic right distal penile mass, for some reason irriated now with worsening red, swelling without fever, chills, red streaks, or drainage.  Has no hx of same  Nothing else makes better or worse.  Pt denies chest pain, increased sob or doe, wheezing, orthopnea, PND, increased LE swelling, palpitations, dizziness or syncope.   ?      ?Wt Readings from Last 3 Encounters:  ?10/04/21 155 lb 6.4 oz (70.5 kg)  ?10/01/21 155 lb (70.3 kg)  ?06/29/21 152 lb (68.9 kg)  ? ?BP Readings from Last 3 Encounters:  ?10/04/21 122/68  ?10/01/21 (!) 110/58  ?06/29/21 (!) 152/62  ? ?      ?Past Medical History:  ?Diagnosis Date  ? Basal cell cancer   ? Dr Ubaldo Glassing  ? DDD (degenerative disc disease)   ? Diverticulosis   ? Esophageal stricture   ? Dr Olevia Perches  ? Gastritis   ? GERD (gastroesophageal reflux disease)   ? Rosanna Randy syndrome   ? Glaucoma   ? Hepatic cyst   ? History of prostate cancer   ? Dr Alinda Money  ? Hx of adenomatous colonic polyps   ? Hyperlipidemia   ? has had good cholesterol count for years.  ? Internal hemorrhoids   ? Pulmonary nodule   ? not seen on F/U 2014  ? TMJ (dislocation of temporomandibular joint)   ? ?Past Surgical History:  ?Procedure Laterality Date  ? APPENDECTOMY    ? cataract surgery  2012  ? bilateral  ? CHOLECYSTECTOMY  1985  ? COLONOSCOPY W/ POLYPECTOMY  2004  ? Negative 2009 & 2013 Dr.Brodie  ? ESOPHAGEAL DILATION  2004  ? MOHS SURGERY    ? Basal Cell  ? PROSTATECTOMY  07/2005  ? Dr.Borden  ? ROTATOR CUFF REPAIR    ?  X 2-Left; Dr Percell Miller  ? ? reports that he has never smoked. He has never used smokeless tobacco. He reports current alcohol use of about 5.0 standard drinks per week. He reports that he does not use drugs. ?family history includes Colon cancer (age of onset: 78) in his  mother; Heart attack (age of onset: 22) in his maternal uncle; Heart failure in his mother; Multiple myeloma in his father. ?Allergies  ?Allergen Reactions  ? Codeine   ?  REACTION: agitation, mental status changes with high doses of codeine post op ?Can take Tramadol OK, low dose Norco ok  ? Nabumetone   ?  Mental status changes post op with Relafen  ? Pneumovax [Pneumococcal Polysaccharide Vaccine]   ?  swelling  ? Prevnar 13 [Pneumococcal 13-Val Conj Vacc]   ?  rash  ? Aspirin   ?  gastritis  ? ?Current Outpatient Medications on File Prior to Visit  ?Medication Sig Dispense Refill  ? azithromycin (ZITHROMAX Z-PAK) 250 MG tablet As directed 6 tablet 0  ? b complex vitamins tablet Take 1 tablet by mouth daily. 100 tablet 3  ? Cholecalciferol (VITAMIN D3) 50 MCG (2000 UT) capsule Take 1 capsule (2,000 Units total) by mouth daily. 100 capsule 3  ? dicyclomine (BENTYL) 10 MG capsule TAKE 1 CAPSULE (10 MG TOTAL) BY MOUTH 4 (FOUR) TIMES DAILY - BEFORE MEALS AND AT  BEDTIME. 100 capsule 2  ? latanoprost (XALATAN) 0.005 % ophthalmic solution Place 1 drop into both eyes at bedtime.    ? LORazepam (ATIVAN) 1 MG tablet Take 1 tablet (1 mg total) by mouth every 8 (eight) hours as needed. for anxiety 90 tablet 1  ? neomycin-polymyxin-hydrocortisone (CORTISPORIN) OTIC solution Apply 1-2 drops to toe after soaking once daily 10 mL 0  ? omeprazole (PRILOSEC) 40 MG capsule TAKE 1 CAPSULE BY MOUTH TWICE A DAY 180 capsule 3  ? rosuvastatin (CRESTOR) 10 MG tablet TAKE 1 TABLET BY MOUTH EVERY DAY 90 tablet 3  ? traMADol (ULTRAM) 50 MG tablet Take 1 tablet (50 mg total) by mouth every 6 (six) hours as needed for severe pain. 20 tablet 0  ? traZODone (DESYREL) 50 MG tablet TAKE 1-2 TABLETS BY MOUTH AT BEDTIME AS NEEDED FOR SLEEP. 180 tablet 3  ? ?No current facility-administered medications on file prior to visit.  ? ?     ROS:  All others reviewed and negative. ? ?Objective  ? ?     PE:  BP 122/68   Pulse 65   Resp 18   Ht _0   (1.702 m)   Wt 155 lb 6.4 oz (70.5 kg)   SpO2 97%   BMI 24.34 kg/m?  ? ?              Constitutional: Pt appears in NAD ?              HENT: Head: NCAT.  ?              Right Ear: External ear normal.   ?              Left Ear: External ear normal.  ?              Eyes: . Pupils are equal, round, and reactive to light. Conjunctivae and EOM are normal ?              Nose: without d/c or deformity ?              Neck: Neck supple. Gross normal ROM ?              Cardiovascular: Normal rate and regular rhythm.   ?              Pulmonary/Chest: Effort normal and breath sounds without rales or wheezing.  ?              Abd:  Soft, NT, ND, + BS, no organomegaly ?              Penile shaft with 20 mm soft fleshy mass just proximal to right glans penis, with red swelling without fluctuance or drianage ?              Neurological: Pt is alert. At baseline orientation, motor grossly intact ?              Skin: Skin is warm. No rashes, no other new lesions, LE edema - none ?              Psychiatric: Pt behavior is normal without agitation  ? ?Micro: none ? ?Cardiac tracings I have personally interpreted today:  none ? ?Pertinent Radiological findings (summarize): none  ? ?Lab Results  ?Component Value Date  ? WBC 5.0 06/29/2021  ? HGB 13.9 06/29/2021  ? HCT 43.8 06/29/2021  ? PLT 163 06/29/2021  ? GLUCOSE 100 (H)  10/01/2021  ? CHOL 134 03/19/2021  ? TRIG 149.0 03/19/2021  ? HDL 44.50 03/19/2021  ? LDLDIRECT 78.8 02/05/2011  ? [place] 60 03/19/2021  ? ALT 26 10/01/2021  ? AST 26 10/01/2021  ? NA 141 10/01/2021  ? K 4.7 10/01/2021  ? CL 105 10/01/2021  ? CREATININE 1.13 10/01/2021  ? BUN 17 10/01/2021  ? CO2 31 10/01/2021  ? TSH 2.23 03/19/2021  ? PSA 0.00 (L) 03/19/2021  ? HGBA1C 5.7 03/07/2014  ? ?Assessment/Plan:  ?Edwin Ramirez is a 81 y.o. White or Caucasian [1] male with  has a past medical history of Basal cell cancer, DDD (degenerative disc disease), Diverticulosis, Esophageal stricture, Gastritis, GERD  (gastroesophageal reflux disease), Rosanna Randy syndrome, Glaucoma, Hepatic cyst, History of prostate cancer, adenomatous colonic polyps, Hyperlipidemia, Internal hemorrhoids, Pulmonary nodule, and TMJ (dislocation of temporomandibular joint). ? ?Penile mass ?Chronic mass but now ? Infection - for doxy course, and refer urology for removal ? ?Followup: No follow-ups on file. ? ?Cathlean Cower, MD 10/07/2021 7:44 PM ?[place] ?[place] ?Internal Medicine ?

## 2021-10-07 DIAGNOSIS — N4889 Other specified disorders of penis: Secondary | ICD-10-CM | POA: Insufficient documentation

## 2021-10-07 NOTE — Patient Instructions (Signed)
Please take all new medication as prescribed ? ?You will be contacted regarding the referral for: urology ?

## 2021-10-07 NOTE — Assessment & Plan Note (Signed)
Chronic mass but now ? Infection - for doxy course, and refer urology for removal ?

## 2021-10-19 DIAGNOSIS — H35373 Puckering of macula, bilateral: Secondary | ICD-10-CM | POA: Diagnosis not present

## 2021-10-19 DIAGNOSIS — H401131 Primary open-angle glaucoma, bilateral, mild stage: Secondary | ICD-10-CM | POA: Diagnosis not present

## 2021-10-19 DIAGNOSIS — H0102B Squamous blepharitis left eye, upper and lower eyelids: Secondary | ICD-10-CM | POA: Diagnosis not present

## 2021-10-19 DIAGNOSIS — H0102A Squamous blepharitis right eye, upper and lower eyelids: Secondary | ICD-10-CM | POA: Diagnosis not present

## 2021-10-23 ENCOUNTER — Ambulatory Visit
Admission: EM | Admit: 2021-10-23 | Discharge: 2021-10-23 | Disposition: A | Discharge status: Home / Self Care | Payer: Medicare Other | Attending: Physician Assistant | Admitting: Physician Assistant

## 2021-10-23 ENCOUNTER — Ambulatory Visit (INDEPENDENT_AMBULATORY_CARE_PROVIDER_SITE_OTHER): Payer: Medicare Other

## 2021-10-23 DIAGNOSIS — R0781 Pleurodynia: Secondary | ICD-10-CM

## 2021-10-23 DIAGNOSIS — R0789 Other chest pain: Secondary | ICD-10-CM

## 2021-10-23 MED ORDER — LIDOCAINE 5 % EX PTCH: 1.0000 | MEDICATED_PATCH | CUTANEOUS | 0 refills | Status: DC

## 2021-10-23 MED ORDER — LIDOCAINE 5 % EX OINT: 1.0000 "application " | TOPICAL_OINTMENT | CUTANEOUS | 1 refills | Status: DC | PRN

## 2021-10-23 NOTE — Discharge Instructions (Signed)
Your x-ray was normal.  Can believe that you have strained a muscle in your ribs.  Please continue using heat and Tylenol for symptom relief.  You can use a lidocaine patch for up to 12 hours/day.  You should remove the patch for minimum of 12 hours and only use 1 patch per 24-hour period.  If you have any worsening symptoms including shortness of breath, cough, increased pain you should be reevaluated.  If things are not improving within a few weeks please return or see your PCP. ?

## 2021-10-23 NOTE — ED Provider Notes (Addendum)
?[facility] ? ? ? ?CSN: [id number] ?Arrival date & time: 10/23/21  1554 ? ? ?  ? ?History   ?Chief Complaint ?Chief Complaint  ?Patient presents with  ? Back Pain  ? ? ?HPI ?Edwin Ramirez is a 81 y.o. male.  ? ?Patient presents today with a several day history of right rib pain.  He denies any specific trauma but reports that symptoms began soon after he was reaching into the car and he felt a sudden pain in his right ribs.  He reports pain is minimal at rest but increases to a 10 on right rib, described as sharp, worse with certain movement including twisting motion, no alleviating factors identified.  He has tried Tylenol without improvement.  He is having difficulty daily activities as pain is interfering with his sleep.  He denies any shortness of breath, hemoptysis, cough, nausea, vomiting. ? ? ?Past Medical History:  ?Diagnosis Date  ? Basal cell cancer   ? Dr Ubaldo Glassing  ? DDD (degenerative disc disease)   ? Diverticulosis   ? Esophageal stricture   ? Dr Olevia Perches  ? Gastritis   ? GERD (gastroesophageal reflux disease)   ? Rosanna Randy syndrome   ? Glaucoma   ? Hepatic cyst   ? History of prostate cancer   ? Dr Alinda Money  ? Hx of adenomatous colonic polyps   ? Hyperlipidemia   ? has had good cholesterol count for years.  ? Internal hemorrhoids   ? Pulmonary nodule   ? not seen on F/U 2014  ? TMJ (dislocation of temporomandibular joint)   ? ? ?Patient Active Problem List  ? Diagnosis Date Noted  ? Penile mass 10/07/2021  ? Myalgia 10/01/2021  ? Acute bronchitis 10/01/2021  ? Plantar fasciitis 03/19/2021  ? Chest wall contusion, left, sequela 03/19/2021  ? Strain of left calf muscle 10/26/2020  ? COVID-19 08/01/2020  ? Sciatica 05/10/2020  ? Shoulder pain 03/15/2020  ? Medication monitoring encounter 08/03/2019  ? 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 Eczema of tip of finger 06/13/2019  ? Carotid bruit 03/15/2019  ? Occipital neuralgia 03/09/2019  ? Insomnia 03/09/2019  ? Sinus  pressure 02/09/2019  ? Earache on left 01/12/2019  ? CAD (coronary artery disease) 07/07/2018  ? Rash 01/26/2018  ? Chest wall contusion, right, initial encounter 09/25/2017  ? Sinusitis 09/03/2017  ? Multiple bruises 12/16/2016  ? Concussion with no loss of consciousness 12/16/2016  ? Abdominal pain 07/08/2016  ? Nausea 07/08/2016  ? RUQ pain 04/26/2016  ? Back pain 04/26/2016  ? Low back pain radiating to right lower extremity 03/01/2016  ? Sciatic radiculitis 02/13/2016  ? Well adult exam 03/01/2015  ? Chest wall pain 03/19/2010  ? DECREASED HEARING 01/17/2009  ? 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 PULMONARY NODULE 05/19/2008  ? HEMORRHOIDS, INTERNAL 05/18/2008  ? ESOPHAGEAL STRICTURE 05/18/2008  ? DIVERTICULOSIS OF COLON 05/18/2008  ? COLONIC POLYPS, ADENOMATOUS, HX OF 05/18/2008  ? 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 PROSTATE CANCER, HX OF 01/12/2008  ? HEPATIC CYST 12/01/2007  ? GERD 09/03/2007  ? Dyslipidemia 03/03/2007  ? Skin cancer 10/29/2006  ? ? ?Past Surgical History:  ?Procedure Laterality Date  ? APPENDECTOMY    ? cataract surgery  2012  ? bilateral  ? CHOLECYSTECTOMY  1985  ? COLONOSCOPY W/ POLYPECTOMY  2004  ? Negative 2009 & 2013 Dr.Brodie  ? ESOPHAGEAL DILATION  2004  ? MOHS SURGERY    ? Basal Cell  ? PROSTATECTOMY  07/2005  ? Dr.Borden  ? ROTATOR CUFF REPAIR    ?  X 2-Left; Dr Percell Miller  ? ? ? ? ? ?Home Medications   ? ?Prior to Admission medications   ?Medication Sig Start Date End Date Taking? Authorizing Provider  ?lidocaine (XYLOCAINE) 5 % ointment Apply 1 application. topically as needed. 10/23/21  Yes Naylee Frankowski, Junie Panning K, PA-C  ?azithromycin (ZITHROMAX Z-PAK) 250 MG tablet As directed 10/01/21   Plotnikov, Evie Lacks, MD  ?b complex vitamins tablet Take 1 tablet by mouth daily. 03/09/19   Plotnikov, Evie Lacks, MD  ?Cholecalciferol (VITAMIN D3) 50 MCG (2000 UT) capsule Take 1 capsule (2,000 Units total) by mouth daily. 03/09/19   Plotnikov, Evie Lacks, MD  ?dicyclomine  (BENTYL) 10 MG capsule TAKE 1 CAPSULE (10 MG TOTAL) BY MOUTH 4 (FOUR) TIMES DAILY - BEFORE MEALS AND AT BEDTIME. 09/20/21   Plotnikov, Evie Lacks, MD  ?doxycycline (VIBRA-TABS) 100 MG tablet Take 1 tablet (100 mg total) by mouth 2 (two) times daily. 10/04/21   Biagio Borg, MD  ?latanoprost (XALATAN) 0.005 % ophthalmic solution Place 1 drop into both eyes at bedtime.    [provider]  ?LORazepam (ATIVAN) 1 MG tablet Take 1 tablet (1 mg total) by mouth every 8 (eight) hours as needed. for anxiety 06/19/21   Plotnikov, Evie Lacks, MD  ?neomycin-polymyxin-hydrocortisone (CORTISPORIN) OTIC solution Apply 1-2 drops to toe after soaking once daily 12/28/20   Landis Martins, DPM  ?omeprazole (PRILOSEC) 40 MG capsule TAKE 1 CAPSULE BY MOUTH TWICE A DAY 03/26/21   Plotnikov, Evie Lacks, MD  ?rosuvastatin (CRESTOR) 10 MG tablet TAKE 1 TABLET BY MOUTH EVERY DAY 03/15/21   Plotnikov, Evie Lacks, MD  ?traMADol (ULTRAM) 50 MG tablet Take 1 tablet (50 mg total) by mouth every 6 (six) hours as needed for severe pain. 05/10/20   Plotnikov, Evie Lacks, MD  ?traZODone (DESYREL) 50 MG tablet TAKE 1-2 TABLETS BY MOUTH AT BEDTIME AS NEEDED FOR SLEEP. 04/13/21   Plotnikov, Evie Lacks, MD  ? ? ?Family History ?Family History  ?Problem Relation Age of Onset  ? Multiple myeloma Father   ? Colon cancer Mother 12  ? Heart failure Mother   ? Heart attack Maternal Uncle 65  ? Diabetes Neg Hx   ? Stroke Neg Hx   ? Esophageal cancer Neg Hx   ? Rectal cancer Neg Hx   ? Stomach cancer Neg Hx   ? ? ?Social History ?Social History  ? ?Tobacco Use  ? Smoking status: Never  ? Smokeless tobacco: Never  ?Vaping Use  ? Vaping Use: Never used  ?Substance Use Topics  ? Alcohol use: Yes  ?  Alcohol/week: 5.0 standard drinks  ?  Types: 5 Glasses of wine per week  ?  Comment: Socially  ? Drug use: No  ? ? ? ?Allergies   ?Codeine, Nabumetone, Pneumovax [pneumococcal polysaccharide vaccine], Prevnar 13 [pneumococcal 13-val conj vacc], and Aspirin ? ? ?Review of  Systems ?Review of Systems  ?Constitutional:  Positive for activity change. Negative for appetite change, fatigue and fever.  ?Respiratory:  Negative for cough and shortness of breath.   ?Cardiovascular:  Positive for chest pain (Right lateral chest wall).  ?Gastrointestinal:  Negative for abdominal pain, diarrhea, nausea and vomiting.  ?Musculoskeletal:  Negative for arthralgias, back pain and myalgias.  ?Skin:  Negative for rash.  ?Neurological:  Negative for dizziness, light-headedness and headaches.  ? ? ?Physical Exam ?Triage Vital Signs ?ED Triage Vitals [10/23/21 1625]  ?Enc Vitals Group  ?  BP 117/63  ?   Pulse Rate 60  ?   Resp 18  ?   Temp 97.7 ?F (36.5 ?C)  ?   Temp Source Oral  ?   SpO2 95 %  ?   Weight   ?   Height   ?   Head Circumference   ?   Peak Flow   ?   Pain Score 8  ?   Pain Loc   ?   Pain Edu?   ?   Excl. in [place]?   ? ?No data found. ? ?Updated Vital Signs ?BP 117/63 (BP Location: Left Arm)   Pulse 60   Temp 97.7 ?F (36.5 ?C) (Oral)   Resp 18   SpO2 95%  ? ?Visual Acuity ?Right Eye Distance:   ?Left Eye Distance:   ?Bilateral Distance:   ? ?Right Eye Near:   ?Left Eye Near:    ?Bilateral Near:    ? ?Physical Exam ?Vitals reviewed.  ?Constitutional:   ?   General: He is awake.  ?   Appearance: Normal appearance. He is well-developed. He is not ill-appearing.  ?   Comments: Very pleasant male appears stated age in no acute distress sitting comfortably in exam room  ?HENT:  ?   Head: Normocephalic and atraumatic.  ?Cardiovascular:  ?   Rate and Rhythm: Normal rate and regular rhythm.  ?   Heart sounds: Normal heart sounds, S1 normal and S2 normal. No murmur heard. ?Pulmonary:  ?   Effort: Pulmonary effort is normal.  ?   Breath sounds: Normal breath sounds. No stridor. No wheezing, rhonchi or rales.  ?   Comments: Clear to auscultation bilaterally ?Chest:  ?   Chest wall: Tenderness present. No deformity or swelling.  ?   Comments: Tenderness palpation over lateral lower right rib cage.   ?Abdominal:  ?   General: Bowel sounds are normal.  ?   Palpations: Abdomen is soft.  ?   Tenderness: There is no abdominal tenderness.  ?   Comments: Benign abdominal exam  ?Musculoskeletal:  ?   Cervical back: No tendern

## 2021-10-23 NOTE — ED Triage Notes (Signed)
4 days ago, after reaching into his wife's car Pt reports that he felt a pull and later an onset of right sided thoracic back pain that radiates underneath his breath. Deep breathing and twisting aggravates sxs. Notes pain with sleeping on the right side. No meds taken. ?

## 2021-11-28 ENCOUNTER — Telehealth: Payer: Self-pay | Admitting: Internal Medicine

## 2021-11-28 NOTE — Telephone Encounter (Signed)
Left message for patient to call back to schedule Medicare Annual Wellness Visit   Last AWV  04/05/19  Please schedule at anytime with [facility]-Nurse Health Advisor if patient calls the office back.     Any questions, please call me at [phone] 

## 2022-02-19 ENCOUNTER — Other Ambulatory Visit: Payer: Self-pay | Admitting: Internal Medicine

## 2022-02-22 ENCOUNTER — Telehealth: Payer: Self-pay | Admitting: *Deleted

## 2022-02-22 NOTE — Telephone Encounter (Signed)
Rec'd fax need PA on Lorazepam. Submitted w/ Key: [id number]. PA sent for determination.Marland KitchenJohny Chess

## 2022-02-22 NOTE — Telephone Encounter (Signed)
Rec'd determination med was APPROVED. Effective from 02/22/2022 through 02/23/2023. Fax approval to pof.Marland KitchenJohny Chess

## 2022-02-25 DIAGNOSIS — C44329 Squamous cell carcinoma of skin of other parts of face: Secondary | ICD-10-CM | POA: Diagnosis not present

## 2022-02-25 DIAGNOSIS — L570 Actinic keratosis: Secondary | ICD-10-CM | POA: Diagnosis not present

## 2022-02-25 DIAGNOSIS — Z85828 Personal history of other malignant neoplasm of skin: Secondary | ICD-10-CM | POA: Diagnosis not present

## 2022-02-25 NOTE — Telephone Encounter (Signed)
Rec'd determination fax med was APPROVED. Effective 02/22/22 through 02/23/23. Faxed approval to pof..Princella Pellegrini

## 2022-02-26 DIAGNOSIS — H43813 Vitreous degeneration, bilateral: Secondary | ICD-10-CM | POA: Diagnosis not present

## 2022-02-26 DIAGNOSIS — H35373 Puckering of macula, bilateral: Secondary | ICD-10-CM | POA: Diagnosis not present

## 2022-02-26 DIAGNOSIS — H401131 Primary open-angle glaucoma, bilateral, mild stage: Secondary | ICD-10-CM | POA: Diagnosis not present

## 2022-02-26 DIAGNOSIS — H0102A Squamous blepharitis right eye, upper and lower eyelids: Secondary | ICD-10-CM | POA: Diagnosis not present

## 2022-03-04 ENCOUNTER — Encounter: Payer: Self-pay | Admitting: Internal Medicine

## 2022-03-11 ENCOUNTER — Telehealth: Payer: Self-pay | Admitting: Internal Medicine

## 2022-03-11 NOTE — Telephone Encounter (Signed)
LVM for pt to rtn my call to schedule AWV with [facility] call back # [phone] 

## 2022-03-20 ENCOUNTER — Encounter: Payer: Self-pay | Admitting: Internal Medicine

## 2022-03-20 ENCOUNTER — Ambulatory Visit (INDEPENDENT_AMBULATORY_CARE_PROVIDER_SITE_OTHER): Payer: Medicare Other | Admitting: Internal Medicine

## 2022-03-20 VITALS — BP 112/58 | HR 56 | Temp 97.7°F | Ht 67.0 in | Wt 154.2 lb

## 2022-03-20 DIAGNOSIS — Z23 Encounter for immunization: Secondary | ICD-10-CM

## 2022-03-20 DIAGNOSIS — Z1211 Encounter for screening for malignant neoplasm of colon: Secondary | ICD-10-CM

## 2022-03-20 DIAGNOSIS — E559 Vitamin D deficiency, unspecified: Secondary | ICD-10-CM | POA: Diagnosis not present

## 2022-03-20 DIAGNOSIS — E785 Hyperlipidemia, unspecified: Secondary | ICD-10-CM

## 2022-03-20 DIAGNOSIS — Z0000 Encounter for general adult medical examination without abnormal findings: Secondary | ICD-10-CM | POA: Diagnosis not present

## 2022-03-20 DIAGNOSIS — R202 Paresthesia of skin: Secondary | ICD-10-CM | POA: Insufficient documentation

## 2022-03-20 DIAGNOSIS — R1011 Right upper quadrant pain: Secondary | ICD-10-CM | POA: Diagnosis not present

## 2022-03-20 LAB — CBC WITH DIFFERENTIAL/PLATELET
Basophils Absolute: 0 10*3/uL (ref 0.0–0.1)
Basophils Relative: 0.7 % (ref 0.0–3.0)
Eosinophils Absolute: 0.1 10*3/uL (ref 0.0–0.7)
Eosinophils Relative: 3 % (ref 0.0–5.0)
HCT: 41.4 % (ref 39.0–52.0)
Hemoglobin: 13.8 g/dL (ref 13.0–17.0)
Lymphocytes Relative: 26.2 % (ref 12.0–46.0)
Lymphs Abs: 1.2 10*3/uL (ref 0.7–4.0)
MCHC: 33.3 g/dL (ref 30.0–36.0)
MCV: 98 fl (ref 78.0–100.0)
Monocytes Absolute: 0.5 10*3/uL (ref 0.1–1.0)
Monocytes Relative: 9.9 % (ref 3.0–12.0)
Neutro Abs: 2.8 10*3/uL (ref 1.4–7.7)
Neutrophils Relative %: 60.2 % (ref 43.0–77.0)
Platelets: 144 10*3/uL — ABNORMAL LOW (ref 150.0–400.0)
RBC: 4.23 Mil/uL (ref 4.22–5.81)
RDW: 13 % (ref 11.5–15.5)
WBC: 4.7 10*3/uL (ref 4.0–10.5)

## 2022-03-20 LAB — COMPREHENSIVE METABOLIC PANEL
ALT: 19 U/L (ref 0–53)
AST: 21 U/L (ref 0–37)
Albumin: 4.4 g/dL (ref 3.5–5.2)
Alkaline Phosphatase: 59 U/L (ref 39–117)
BUN: 14 mg/dL (ref 6–23)
CO2: 31 mEq/L (ref 19–32)
Calcium: 9.5 mg/dL (ref 8.4–10.5)
Chloride: 105 mEq/L (ref 96–112)
Creatinine, Ser: 1.15 mg/dL (ref 0.40–1.50)
GFR: 58.89 mL/min — ABNORMAL LOW (ref >60.00)
Glucose, Bld: 93 mg/dL (ref 70–99)
Potassium: 4.2 mEq/L (ref 3.5–5.1)
Sodium: 142 mEq/L (ref 135–145)
Total Bilirubin: 1.2 mg/dL (ref 0.2–1.2)
Total Protein: 6.7 g/dL (ref 6.0–8.3)

## 2022-03-20 LAB — VITAMIN D 25 HYDROXY (VIT D DEFICIENCY, FRACTURES): VITD: 60.31 ng/mL (ref 30.00–100.00)

## 2022-03-20 LAB — URINALYSIS
Bilirubin Urine: NEGATIVE
Hgb urine dipstick: NEGATIVE
Ketones, ur: NEGATIVE
Leukocytes,Ua: NEGATIVE
Nitrite: NEGATIVE
Specific Gravity, Urine: 1.02 (ref 1.000–1.030)
Total Protein, Urine: NEGATIVE
Urine Glucose: NEGATIVE
Urobilinogen, UA: 0.2 (ref 0.0–1.0)
pH: 5.5 (ref 5.0–8.0)

## 2022-03-20 LAB — LIPID PANEL
Cholesterol: 120 mg/dL (ref 0–200)
HDL: 44.2 mg/dL (ref >39.00)
LDL Cholesterol: 55 mg/dL (ref 0–99)
NonHDL: 75.91
Total CHOL/HDL Ratio: 3
Triglycerides: 106 mg/dL (ref 0.0–149.0)
VLDL: 21.2 mg/dL (ref 0.0–40.0)

## 2022-03-20 LAB — PSA: PSA: 0 ng/mL — ABNORMAL LOW (ref 0.10–4.00)

## 2022-03-20 LAB — TSH: TSH: 1.88 u[IU]/mL (ref 0.35–5.50)

## 2022-03-20 LAB — SEDIMENTATION RATE: Sed Rate: 21 mm/hr — ABNORMAL HIGH (ref 0–20)

## 2022-03-20 LAB — VITAMIN B12: Vitamin B-12: 294 pg/mL (ref 211–911)

## 2022-03-20 MED ORDER — DICYCLOMINE HCL 10 MG PO CAPS: 10.0000 mg | ORAL_CAPSULE | Freq: Three times a day (TID) | ORAL | 1 refills | Status: DC

## 2022-03-20 NOTE — Assessment & Plan Note (Signed)
The recurrent pain/cramps is likely related to IBS.  Edwin Ramirez saw Dr. Loletha Carrow and was prescribed dicyclomine that he takes on occasion.

## 2022-03-20 NOTE — Assessment & Plan Note (Addendum)

## 2022-03-20 NOTE — Patient Instructions (Signed)
Blue-Emu cream -- use 2-3 times a day ? ?

## 2022-03-20 NOTE — Progress Notes (Signed)
Subjective:  Patient ID: Edwin Ramirez, male    DOB: 1940-08-12  Age: 81 y.o. MRN: [id number]  CC: Annual Exam (Flu shot) and Medication Refill (Need refill on Bentyl)   HPI Edwin Ramirez presents for a well exam F/u on IBS C/o tingly toes x 6 weeks  Outpatient Medications Prior to Visit  Medication Sig Dispense Refill   b complex vitamins tablet Take 1 tablet by mouth daily. 100 tablet 3   Cholecalciferol (VITAMIN D3) 50 MCG (2000 UT) capsule Take 1 capsule (2,000 Units total) by mouth daily. 100 capsule 3   latanoprost (XALATAN) 0.005 % ophthalmic solution Place 1 drop into both eyes at bedtime.     lidocaine (XYLOCAINE) 5 % ointment Apply 1 application. topically as needed. 35.44 g 1   LORazepam (ATIVAN) 1 MG tablet TAKE 1 TABLET BY MOUTH EVERY 8 HOURS AS NEEDED FOR ANXIETY 90 tablet 1   neomycin-polymyxin-hydrocortisone (CORTISPORIN) OTIC solution Apply 1-2 drops to toe after soaking once daily 10 mL 0   omeprazole (PRILOSEC) 40 MG capsule TAKE 1 CAPSULE BY MOUTH TWICE A DAY 180 capsule 3   rosuvastatin (CRESTOR) 10 MG tablet TAKE 1 TABLET BY MOUTH EVERY DAY 90 tablet 3   traMADol (ULTRAM) 50 MG tablet Take 1 tablet (50 mg total) by mouth every 6 (six) hours as needed for severe pain. 20 tablet 0   traZODone (DESYREL) 50 MG tablet TAKE 1-2 TABLETS BY MOUTH AT BEDTIME AS NEEDED FOR SLEEP. 180 tablet 3   dicyclomine (BENTYL) 10 MG capsule Take 1 capsule (10 mg total) by mouth 4 (four) times daily -  before meals and at bedtime. Annual appt due in Oct must see provider for future refills 100 capsule 0   azithromycin (ZITHROMAX Z-PAK) 250 MG tablet As directed (Patient not taking: Reported on 03/20/2022) 6 tablet 0   doxycycline (VIBRA-TABS) 100 MG tablet Take 1 tablet (100 mg total) by mouth 2 (two) times daily. (Patient not taking: Reported on 03/20/2022) 20 tablet 0   No facility-administered medications prior to visit.    ROS: Review of Systems  Constitutional:   Negative for appetite change, fatigue and unexpected weight change.  HENT:  Negative for congestion, nosebleeds, sneezing, sore throat and trouble swallowing.   Eyes:  Negative for itching and visual disturbance.  Respiratory:  Negative for cough.   Cardiovascular:  Negative for chest pain, palpitations and leg swelling.  Gastrointestinal:  Negative for abdominal distention, blood in stool, diarrhea and nausea.  Genitourinary:  Negative for frequency and hematuria.  Musculoskeletal:  Negative for back pain, gait problem, joint swelling and neck pain.  Skin:  Negative for rash.  Neurological:  Negative for dizziness, tremors, speech difficulty and weakness.  Psychiatric/Behavioral:  Negative for agitation, dysphoric mood and sleep disturbance. The patient is not nervous/anxious.     Objective:  BP (!) 112/58 (BP Location: Left Arm)   Pulse (!) 56   Temp 97.7 F (36.5 C) (Oral)   Ht '5\' 7"'$  (1.702 m)   Wt 154 lb 3.2 oz (69.9 kg)   SpO2 95%   BMI 24.15 kg/m   BP Readings from Last 3 Encounters:  03/20/22 (!) 112/58  10/23/21 117/63  10/04/21 122/68    Wt Readings from Last 3 Encounters:  03/20/22 154 lb 3.2 oz (69.9 kg)  10/04/21 155 lb 6.4 oz (70.5 kg)  10/01/21 155 lb (70.3 kg)    Physical Exam Constitutional:      General: He is not in acute distress.  Appearance: Normal appearance. He is well-developed.     Comments: NAD  Eyes:     Conjunctiva/sclera: Conjunctivae normal.     Pupils: Pupils are equal, round, and reactive to light.  Neck:     Thyroid: No thyromegaly.     Vascular: No JVD.  Cardiovascular:     Rate and Rhythm: Normal rate and regular rhythm.     Heart sounds: Normal heart sounds. No murmur heard.    No friction rub. No gallop.  Pulmonary:     Effort: Pulmonary effort is normal. No respiratory distress.     Breath sounds: Normal breath sounds. No wheezing or rales.  Chest:     Chest wall: No tenderness.  Abdominal:     General: Bowel sounds are  normal. There is no distension.     Palpations: Abdomen is soft. There is no mass.     Tenderness: There is no abdominal tenderness. There is no guarding or rebound.  Musculoskeletal:        General: No tenderness. Normal range of motion.     Cervical back: Normal range of motion.  Lymphadenopathy:     Cervical: No cervical adenopathy.  Skin:    General: Skin is warm and dry.     Findings: No rash.  Neurological:     Mental Status: He is alert and oriented to person, place, and time.     Cranial Nerves: No cranial nerve deficit.     Motor: No abnormal muscle tone.     Coordination: Coordination normal.     Gait: Gait normal.     Deep Tendon Reflexes: Reflexes are normal and symmetric.  Psychiatric:        Behavior: Behavior normal.        Thought Content: Thought content normal.        Judgment: Judgment normal.     Lab Results  Component Value Date   WBC 5.0 06/29/2021   HGB 13.9 06/29/2021   HCT 43.8 06/29/2021   PLT 163 06/29/2021   GLUCOSE 100 (H) 10/01/2021   CHOL 134 03/19/2021   TRIG 149.0 03/19/2021   HDL 44.50 03/19/2021   LDLDIRECT 78.8 02/05/2011   LDLCALC 60 03/19/2021   ALT 26 10/01/2021   AST 26 10/01/2021   NA 141 10/01/2021   K 4.7 10/01/2021   CL 105 10/01/2021   CREATININE 1.13 10/01/2021   BUN 17 10/01/2021   CO2 31 10/01/2021   TSH 2.23 03/19/2021   PSA 0.00 (L) 03/19/2021   HGBA1C 5.7 03/07/2014    DG Ribs Unilateral W/Chest Right  Result Date: 10/23/2021 CLINICAL DATA:  Right-sided rib pain.  Pleuritic chest pain. EXAM: RIGHT RIBS AND CHEST - 3+ VIEW COMPARISON:  Chest radiograph on 06/29/2021 FINDINGS: No fracture or other bone lesions are seen involving the ribs. There is no evidence of pneumothorax or pleural effusion. Both lungs are clear. Heart size and mediastinal contours are within normal limits. IMPRESSION: Negative. Electronically Signed   By: Marlaine Hind M.D.   On: 10/23/2021 17:47    Assessment & Plan:   Problem List Items  Addressed This Visit     Abdominal pain    The recurrent pain/cramps is likely related to IBS.  Edwin Ramirez saw Dr. Loletha Carrow and was prescribed dicyclomine that he takes on occasion.      Dyslipidemia   Relevant Orders   TSH   Lipid panel   Comprehensive metabolic panel   Paresthesia    B feet Blue-Emu cream --use 2-3 times  a day Wide shoes Labs      Relevant Orders   TSH   Urinalysis   CBC with Differential/Platelet   Lipid panel   PSA   Comprehensive metabolic panel   Vitamin P82   VITAMIN D 25 Hydroxy (Vit-D Deficiency, Fractures)   Sedimentation rate   Well adult exam - Primary    We discussed age appropriate health related issues, including available/recomended screening tests and vaccinations. Labs were ordered to be later reviewed . All questions were answered. We discussed one or more of the following - seat belt use, use of sunscreen/sun exposure exercise, fall risk reduction, second hand smoke exposure, firearm use and storage, seat belt use, a need for adhering to healthy diet and exercise. Labs were ordered.  All questions were answered.  Cologuard ordered      Relevant Orders   TSH   Urinalysis   CBC with Differential/Platelet   Lipid panel   PSA   Comprehensive metabolic panel   Vitamin U23   VITAMIN D 25 Hydroxy (Vit-D Deficiency, Fractures)   Sedimentation rate   Other Visit Diagnoses     Needs flu shot       Relevant Orders   Flu Vaccine QUAD High Dose(Fluad) (Completed)   Vitamin D deficiency       Relevant Orders   VITAMIN D 25 Hydroxy (Vit-D Deficiency, Fractures)   Colon cancer screening       Relevant Orders   Cologuard         Meds ordered this encounter  Medications   dicyclomine (BENTYL) 10 MG capsule    Sig: Take 1 capsule (10 mg total) by mouth 4 (four) times daily -  before meals and at bedtime.    Dispense:  100 capsule    Refill:  1      Follow-up: Return in about 1 year (around 03/21/2023) for Wellness Exam.  Walker Kehr,  MD

## 2022-03-20 NOTE — Assessment & Plan Note (Signed)
B feet Blue-Emu cream --use 2-3 times a day Wide shoes Labs

## 2022-04-08 DIAGNOSIS — H35373 Puckering of macula, bilateral: Secondary | ICD-10-CM | POA: Diagnosis not present

## 2022-04-08 DIAGNOSIS — H401131 Primary open-angle glaucoma, bilateral, mild stage: Secondary | ICD-10-CM | POA: Diagnosis not present

## 2022-04-08 DIAGNOSIS — H0102A Squamous blepharitis right eye, upper and lower eyelids: Secondary | ICD-10-CM | POA: Diagnosis not present

## 2022-04-08 DIAGNOSIS — H43813 Vitreous degeneration, bilateral: Secondary | ICD-10-CM | POA: Diagnosis not present

## 2022-04-09 DIAGNOSIS — M19012 Primary osteoarthritis, left shoulder: Secondary | ICD-10-CM | POA: Diagnosis not present

## 2022-04-22 IMAGING — DX DG CHEST 2V
2 series · 2 of 2 positions shown · non-contrast
Comparison: January 26, 2020

CLINICAL DATA: Chest pain and cough. Reported recent [id number]
positive

EXAM:
CHEST - 2 VIEW

[chest pa]
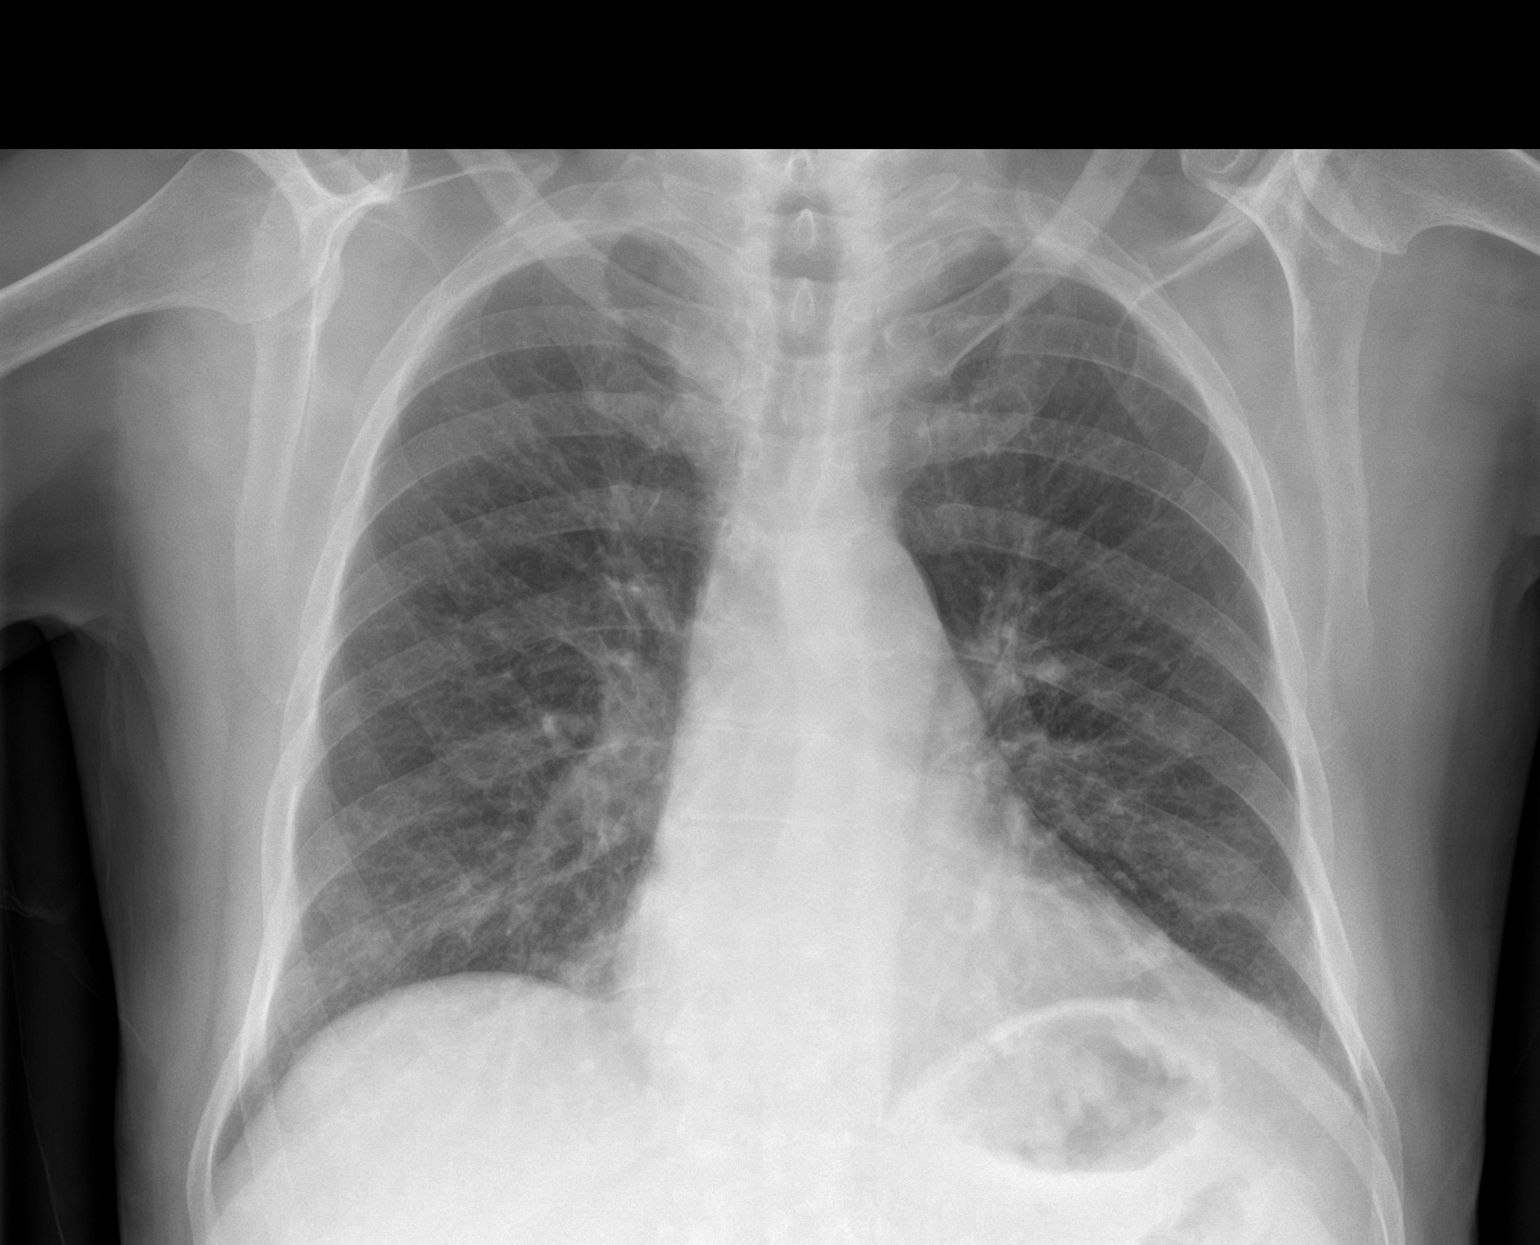

[chest lat]
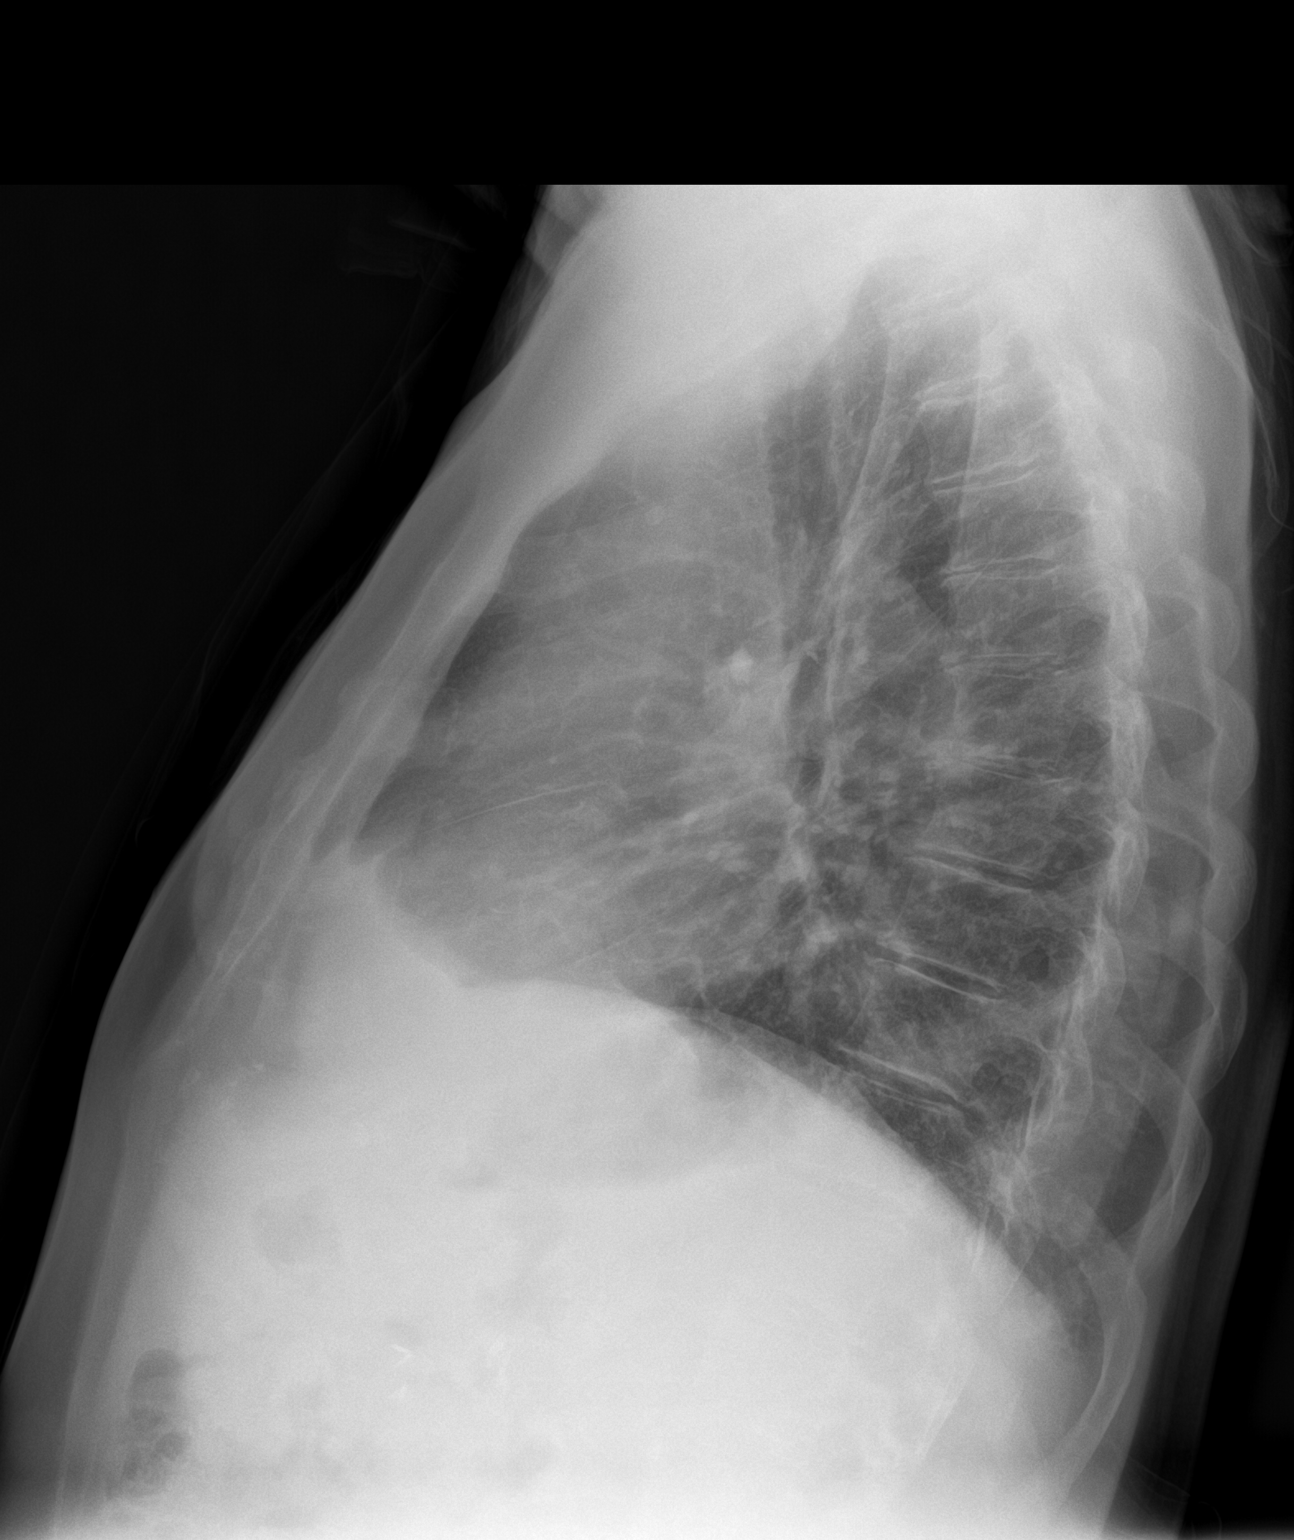

[2 of 2 positions shown; findings below may reference images not displayed]

FINDINGS: Lungs are clear. Heart size and pulmonary vascularity are normal. No
adenopathy. There is mild degenerative change in the thoracic spine.
IMPRESSION: Lungs clear.  Cardiac silhouette normal.

## 2022-05-07 ENCOUNTER — Other Ambulatory Visit: Payer: Self-pay | Admitting: Internal Medicine

## 2022-05-10 ENCOUNTER — Other Ambulatory Visit: Payer: Self-pay | Admitting: Internal Medicine

## 2022-05-15 DIAGNOSIS — Z85828 Personal history of other malignant neoplasm of skin: Secondary | ICD-10-CM | POA: Diagnosis not present

## 2022-05-15 DIAGNOSIS — D1801 Hemangioma of skin and subcutaneous tissue: Secondary | ICD-10-CM | POA: Diagnosis not present

## 2022-05-15 DIAGNOSIS — D692 Other nonthrombocytopenic purpura: Secondary | ICD-10-CM | POA: Diagnosis not present

## 2022-05-15 DIAGNOSIS — L570 Actinic keratosis: Secondary | ICD-10-CM | POA: Diagnosis not present

## 2022-05-20 ENCOUNTER — Other Ambulatory Visit: Payer: Self-pay | Admitting: Internal Medicine

## 2022-05-25 ENCOUNTER — Other Ambulatory Visit: Payer: Self-pay | Admitting: Internal Medicine

## 2022-06-17 ENCOUNTER — Telehealth: Payer: Self-pay | Admitting: Internal Medicine

## 2022-06-17 DIAGNOSIS — Z1211 Encounter for screening for malignant neoplasm of colon: Secondary | ICD-10-CM

## 2022-06-17 NOTE — Telephone Encounter (Signed)
Verified chart MD placed order. Place an order in [employer]. Called pt no answer LMOM order is in [employer]. Pt can call [phone] to speak with an Solicitor since he hasn't received kit.Marland KitchenJohny Chess

## 2022-06-17 NOTE — Telephone Encounter (Signed)
Patient would like a cologuard test sent to him.  Dr. Alain Marion had told him one would be ordered, but he never received it. \ Patients number:  [phone]

## 2022-06-18 ENCOUNTER — Telehealth: Payer: Self-pay | Admitting: Internal Medicine

## 2022-06-18 NOTE — Telephone Encounter (Signed)
Left message for patient to call back to schedule Medicare Annual Wellness Visit   Last AWV  04/05/19  Please schedule at anytime with [facility] if patient calls the office back.     Any questions, please call me at [phone]

## 2022-07-10 NOTE — Telephone Encounter (Signed)
Pt called back he states his insurane does cover would like order resent. Place order for cologuard in epic and on Chief Strategy Officer.Marland KitchenJohny Chess

## 2022-07-10 NOTE — Telephone Encounter (Signed)
Called pt back he Engineer, [employer] told him that the order was not filled out correctly. Inform not sure why they said that, because order is in epic and search [employer] an order is in. Pt was ? About the cost of test. He states he will contact his insurance to see if the cover.Marland KitchenJohny Chess

## 2022-07-10 NOTE — Telephone Encounter (Signed)
Patient called back and stated he has more information to share, would like a call back to discuss at [phone].

## 2022-07-10 NOTE — Addendum Note (Signed)
Addended by: Earnstine Regal on: 07/10/2022 03:21 PM   Modules accepted: Orders

## 2022-07-10 NOTE — Telephone Encounter (Signed)
Patient called back to let us know he spoke with [employer] and they told him that the request was not filled out completely which is why he has not received the colorguard test. He would like a callback to discuss at [phone].

## 2022-07-22 DIAGNOSIS — Z1211 Encounter for screening for malignant neoplasm of colon: Secondary | ICD-10-CM | POA: Diagnosis not present

## 2022-08-01 LAB — COLOGUARD: COLOGUARD: POSITIVE — AB

## 2022-08-02 ENCOUNTER — Other Ambulatory Visit: Payer: Self-pay | Admitting: Internal Medicine

## 2022-08-02 ENCOUNTER — Encounter: Payer: Self-pay | Admitting: Gastroenterology

## 2022-08-02 DIAGNOSIS — R195 Other fecal abnormalities: Secondary | ICD-10-CM

## 2022-08-06 ENCOUNTER — Encounter: Payer: Self-pay | Admitting: Emergency Medicine

## 2022-08-06 ENCOUNTER — Ambulatory Visit (INDEPENDENT_AMBULATORY_CARE_PROVIDER_SITE_OTHER): Payer: Medicare Other | Admitting: Emergency Medicine

## 2022-08-06 VITALS — BP 108/62 | HR 59 | Temp 97.5°F | Ht 67.0 in | Wt 156.1 lb

## 2022-08-06 DIAGNOSIS — K5730 Diverticulosis of large intestine without perforation or abscess without bleeding: Secondary | ICD-10-CM | POA: Diagnosis not present

## 2022-08-06 DIAGNOSIS — R195 Other fecal abnormalities: Secondary | ICD-10-CM | POA: Insufficient documentation

## 2022-08-06 NOTE — Patient Instructions (Signed)
Health Maintenance After Age 84 After age 84, you are at a higher risk for certain long-term diseases and infections as well as injuries from falls. Falls are a major cause of broken bones and head injuries in people who are older than age 84. Getting regular preventive care can help to keep you healthy and well. Preventive care includes getting regular testing and making lifestyle changes as recommended by your health care provider. Talk with your health care provider about: Which screenings and tests you should have. A screening is a test that checks for a disease when you have no symptoms. A diet and exercise plan that is right for you. What should I know about screenings and tests to prevent falls? Screening and testing are the best ways to find a health problem early. Early diagnosis and treatment give you the best chance of managing medical conditions that are common after age 84. Certain conditions and lifestyle choices may make you more likely to have a fall. Your health care provider may recommend: Regular vision checks. Poor vision and conditions such as cataracts can make you more likely to have a fall. If you wear glasses, make sure to get your prescription updated if your vision changes. Medicine review. Work with your health care provider to regularly review all of the medicines you are taking, including over-the-counter medicines. Ask your health care provider about any side effects that may make you more likely to have a fall. Tell your health care provider if any medicines that you take make you feel dizzy or sleepy. Strength and balance checks. Your health care provider may recommend certain tests to check your strength and balance while standing, walking, or changing positions. Foot health exam. Foot pain and numbness, as well as not wearing proper footwear, can make you more likely to have a fall. Screenings, including: Osteoporosis screening. Osteoporosis is a condition that causes  the bones to get weaker and break more easily. Blood pressure screening. Blood pressure changes and medicines to control blood pressure can make you feel dizzy. Depression screening. You may be more likely to have a fall if you have a fear of falling, feel depressed, or feel unable to do activities that you used to do. Alcohol use screening. Using too much alcohol can affect your balance and may make you more likely to have a fall. Follow these instructions at home: Lifestyle Do not drink alcohol if: Your health care provider tells you not to drink. If you drink alcohol: Limit how much you have to: 0-1 drink a day for women. 0-2 drinks a day for men. Know how much alcohol is in your drink. In the [place] one drink equals one 12 oz bottle of beer (355 mL), one 5 oz glass of wine (148 mL), or one 1 oz glass of hard liquor (44 mL). Do not use any products that contain nicotine or tobacco. These products include cigarettes, chewing tobacco, and vaping devices, such as e-cigarettes. If you need help quitting, ask your health care provider. Activity  Follow a regular exercise program to stay fit. This will help you maintain your balance. Ask your health care provider what types of exercise are appropriate for you. If you need a cane or walker, use it as recommended by your health care provider. Wear supportive shoes that have nonskid soles. Safety  Remove any tripping hazards, such as rugs, cords, and clutter. Install safety equipment such as grab bars in bathrooms and safety rails on stairs. Keep rooms and walkways   well-lit. General instructions Talk with your health care provider about your risks for falling. Tell your health care provider if: You fall. Be sure to tell your health care provider about all falls, even ones that seem minor. You feel dizzy, tiredness (fatigue), or off-balance. Take over-the-counter and prescription medicines only as told by your health care provider. These include  supplements. Eat a healthy diet and maintain a healthy weight. A healthy diet includes low-fat dairy products, low-fat (lean) meats, and fiber from whole grains, beans, and lots of fruits and vegetables. Stay current with your vaccines. Schedule regular health, dental, and eye exams. Summary Having a healthy lifestyle and getting preventive care can help to protect your health and wellness after age 84. Screening and testing are the best way to find a health problem early and help you avoid having a fall. Early diagnosis and treatment give you the best chance for managing medical conditions that are more common for people who are older than age 84. Falls are a major cause of broken bones and head injuries in people who are older than age 84. Take precautions to prevent a fall at home. Work with your health care provider to learn what changes you can make to improve your health and wellness and to prevent falls. This information is not intended to replace advice given to you by your health care provider. Make sure you discuss any questions you have with your health care provider. Document Revised: 10/16/2020 Document Reviewed: 10/16/2020 [employer] Patient Education  2023 [employer]  

## 2022-08-06 NOTE — Progress Notes (Signed)
Edwin Ramirez 82 y.o.   Chief Complaint  Patient presents with   Follow-up    F/u appt, patient wants to discuss his cologuard test results     HISTORY OF PRESENT ILLNESS: This is a 82 y.o. male here to discuss positive Cologuard test done recently. He was advised to follow-up with GI doctor.  Has appointment in a couple weeks. No other complaints or medical concerns today.  HPI   Prior to Admission medications   Medication Sig Start Date End Date Taking? Authorizing Provider  b complex vitamins tablet Take 1 tablet by mouth daily. 03/09/19  Yes Plotnikov, Evie Lacks, MD  Cholecalciferol (VITAMIN D3) 50 MCG (2000 UT) capsule Take 1 capsule (2,000 Units total) by mouth daily. 03/09/19  Yes Plotnikov, Evie Lacks, MD  dicyclomine (BENTYL) 10 MG capsule Take 1 capsule (10 mg total) by mouth 4 (four) times daily -  before meals and at bedtime. 05/10/22  Yes Plotnikov, Evie Lacks, MD  latanoprost (XALATAN) 0.005 % ophthalmic solution Place 1 drop into both eyes at bedtime.   Yes [provider]  lidocaine (XYLOCAINE) 5 % ointment Apply 1 application. topically as needed. 10/23/21  Yes Raspet, Erin K, PA-C  LORazepam (ATIVAN) 1 MG tablet TAKE 1 TABLET BY MOUTH EVERY 8 HOURS AS NEEDED FOR ANXIETY 02/21/22  Yes Plotnikov, Evie Lacks, MD  neomycin-polymyxin-hydrocortisone (CORTISPORIN) OTIC solution Apply 1-2 drops to toe after soaking once daily 12/28/20  Yes Stover, Titorya, DPM  omeprazole (PRILOSEC) 40 MG capsule TAKE 1 CAPSULE BY MOUTH TWICE A DAY 05/07/22  Yes Plotnikov, Evie Lacks, MD  rosuvastatin (CRESTOR) 10 MG tablet TAKE 1 TABLET BY MOUTH EVERY DAY 05/25/22  Yes Plotnikov, Evie Lacks, MD  traMADol (ULTRAM) 50 MG tablet Take 1 tablet (50 mg total) by mouth every 6 (six) hours as needed for severe pain. 05/10/20  Yes Plotnikov, Evie Lacks, MD  traZODone (DESYREL) 50 MG tablet TAKE 1 TO 2 TABLETS BY MOUTH AT BEDTIME AS NEEDED FOR SLEEP 05/20/22  Yes Plotnikov, Evie Lacks, MD     Allergies  Allergen Reactions   Codeine     REACTION: agitation, mental status changes with high doses of codeine post op Can take Tramadol OK, low dose Norco ok   Nabumetone     Mental status changes post op with Relafen   Pneumovax [Pneumococcal Polysaccharide Vaccine]     swelling   Prevnar 13 [Pneumococcal 13-Val Conj Vacc]     rash   Aspirin     gastritis    Patient Active Problem List   Diagnosis Date Noted   Paresthesia 03/20/2022   Penile mass 10/07/2021   Myalgia 10/01/2021   Acute bronchitis 10/01/2021   Plantar fasciitis 03/19/2021   Chest wall contusion, left, sequela 03/19/2021   Strain of left calf muscle 10/26/2020   COVID-19 08/01/2020   Sciatica 05/10/2020   Shoulder pain 03/15/2020   Medication monitoring encounter 08/03/2019   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 07/20/2019   Eczema of tip of finger 06/13/2019   Carotid bruit 03/15/2019   Occipital neuralgia 03/09/2019   Insomnia 03/09/2019   Sinus pressure 02/09/2019   Earache on left 01/12/2019   CAD (coronary artery disease) 07/07/2018   Rash 01/26/2018   Chest wall contusion, right, initial encounter 09/25/2017   Sinusitis 09/03/2017   Multiple bruises 12/16/2016   Concussion with no loss of consciousness 12/16/2016   Abdominal pain 07/08/2016   Nausea 07/08/2016   RUQ pain 04/26/2016   Back pain 04/26/2016  Low back pain radiating to right lower extremity 03/01/2016   Sciatic radiculitis 02/13/2016   Well adult exam 03/01/2015   Chest wall pain 03/19/2010   DECREASED HEARING 01/17/2009   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   PULMONARY NODULE 05/19/2008   HEMORRHOIDS, INTERNAL 05/18/2008   ESOPHAGEAL STRICTURE 05/18/2008   DIVERTICULOSIS OF COLON 05/18/2008   COLONIC POLYPS, ADENOMATOUS, HX OF 05/18/2008   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   Internal hemorrhoids    Pulmonary nodule    not seen on F/U 2014   TMJ (dislocation of temporomandibular joint)     Past Surgical History:  Procedure Laterality Date   APPENDECTOMY     cataract surgery  2012   bilateral   CHOLECYSTECTOMY  1985   COLONOSCOPY W/ POLYPECTOMY  2004   Negative 2009 & 2013 Dr.Brodie   ESOPHAGEAL DILATION  2004   MOHS SURGERY     Basal Cell   PROSTATECTOMY  07/2005   Dr.Borden   ROTATOR CUFF REPAIR      X 2-Left; Dr Percell Miller    Social History   Socioeconomic History   Marital status: Married    Spouse name: Not on file   Number of children: 2   Years of education: Not on file   Highest education level: Not on file  Occupational History   Occupation: retired  Tobacco Use   Smoking status: Never   Smokeless tobacco: Never  Vaping Use   Vaping Use: Never used  Substance and Sexual Activity   Alcohol use: Yes    Alcohol/week: 5.0 standard drinks of alcohol    Types: 5 Glasses of wine per week    Comment: Socially   Drug use: No   Sexual activity: Not Currently  Other Topics Concern   Not on file  Social History Narrative   DAILY CAFFEINE   Social Determinants of Health   Financial Resource Strain: Low Risk  (04/05/2019)   Overall Financial Resource Strain (CARDIA)    Difficulty of Paying Living Expenses: Not hard at all  Food Insecurity: No Food Insecurity (04/05/2019)   Hunger Vital Sign    Worried About Running Out of Food in the Last Year: Never true    Ran Out of Food in the  Last Year: Never true  Transportation Needs: No Transportation Needs (04/05/2019)   PRAPARE - Hydrologist (Medical): No    Lack of Transportation (Non-Medical): No  Physical Activity: Sufficiently Active (04/05/2019)   Exercise Vital Sign    Days of Exercise per Week: 5 days    Minutes of Exercise per Session: 60 min  Stress: No Stress Concern Present (04/05/2019)   [place]    Feeling of Stress : Not at all  Social  Connections: Socially Integrated (04/05/2019)   Social Connection and Isolation Panel [NHANES]    Frequency of Communication with Friends and Family: More than three times a week    Frequency of Social Gatherings with Friends and Family: More than three times a week    Attends Religious Services: More than 4 times per year    Active Member of [employer] or Organizations: Yes    Attends Music therapist: More than 4 times per year    Marital Status: Married  Human resources officer Violence: Not At Risk (04/05/2019)   Humiliation, Afraid, Rape, and Kick questionnaire    Fear of Current or Ex-Partner: No    Emotionally Abused: No    Physically Abused: No    Sexually Abused: No    Family History  Problem Relation Age of Onset   Multiple myeloma Father    Colon cancer Mother 33   Heart failure Mother    Heart attack Maternal Uncle 26   Diabetes Neg Hx    Stroke Neg Hx    Esophageal cancer Neg Hx    Rectal cancer Neg Hx    Stomach cancer Neg Hx      Review of Systems  Constitutional: Negative.  Negative for chills and fever.  HENT: Negative.  Negative for congestion and sore throat.   Respiratory: Negative.  Negative for cough and shortness of breath.   Cardiovascular: Negative.  Negative for chest pain and palpitations.  Gastrointestinal:  Negative for abdominal pain, blood in stool, diarrhea, melena, nausea and vomiting.  Genitourinary: Negative.  Negative for  dysuria and hematuria.  Skin: Negative.  Negative for rash.  Neurological: Negative.  Negative for dizziness and headaches.   Today's Vitals   08/06/22 1027  BP: 108/62  Pulse: (!) 59  Temp: (!) 97.5 F (36.4 C)  TempSrc: Oral  SpO2: 93%  Weight: 156 lb 2 oz (70.8 kg)  Height: '5\' 7"'$  (1.702 m)   Body mass index is 24.45 kg/m.   Physical Exam Vitals reviewed.  Constitutional:      Appearance: Normal appearance.  HENT:     Head: Normocephalic.  Eyes:     Extraocular Movements: Extraocular movements intact.  Cardiovascular:     Rate and Rhythm: Normal rate.  Pulmonary:     Effort: Pulmonary effort is normal.  Skin:    General: Skin is warm and dry.  Neurological:     Mental Status: He is alert and oriented to person, place, and time.  Psychiatric:        Mood and Affect: Mood normal.        Behavior: Behavior normal.      ASSESSMENT & PLAN: A total of 34 minutes was spent with the patient and counseling/coordination of care regarding preparing for this visit, review of most recent office visit notes, review of colonoscopy report done in 2014, review of most recent blood work results, review of chronic medical conditions under management, review of all medications, meaning and interpretation of Cologuard test, need for GI follow-up, prognosis, documentation and need for follow-up with PCP.  Problem List Items Addressed This Visit       Digestive   Diverticulosis of colon    As reported from colonoscopy report in 2014. Recommend high-fiber diet. Follow-up with GI doctor as already scheduled.        Other   Positive colorectal cancer screening using Cologuard test - Primary    Asymptomatic. Colonoscopy report from 2014 reviewed.  Mild diverticulosis reported. Recommendation was to  repeat colonoscopy in 10 years. Agree with PCPs recommendation to follow-up with GI doctor.        Patient Instructions  Health Maintenance After Age 57 After age 81, you are at  a higher risk for certain long-term diseases and infections as well as injuries from falls. Falls are a major cause of broken bones and head injuries in people who are older than age 35. Getting regular preventive care can help to keep you healthy and well. Preventive care includes getting regular testing and making lifestyle changes as recommended by your health care provider. Talk with your health care provider about: Which screenings and tests you should have. A screening is a test that checks for a disease when you have no symptoms. A diet and exercise plan that is right for you. What should I know about screenings and tests to prevent falls? Screening and testing are the best ways to find a health problem early. Early diagnosis and treatment give you the best chance of managing medical conditions that are common after age 28. Certain conditions and lifestyle choices may make you more likely to have a fall. Your health care provider may recommend: Regular vision checks. Poor vision and conditions such as cataracts can make you more likely to have a fall. If you wear glasses, make sure to get your prescription updated if your vision changes. Medicine review. Work with your health care provider to regularly review all of the medicines you are taking, including over-the-counter medicines. Ask your health care provider about any side effects that may make you more likely to have a fall. Tell your health care provider if any medicines that you take make you feel dizzy or sleepy. Strength and balance checks. Your health care provider may recommend certain tests to check your strength and balance while standing, walking, or changing positions. Foot health exam. Foot pain and numbness, as well as not wearing proper footwear, can make you more likely to have a fall. Screenings, including: Osteoporosis screening. Osteoporosis is a condition that causes the bones to get weaker and break more easily. Blood  pressure screening. Blood pressure changes and medicines to control blood pressure can make you feel dizzy. Depression screening. You may be more likely to have a fall if you have a fear of falling, feel depressed, or feel unable to do activities that you used to do. Alcohol use screening. Using too much alcohol can affect your balance and may make you more likely to have a fall. Follow these instructions at home: Lifestyle Do not drink alcohol if: Your health care provider tells you not to drink. If you drink alcohol: Limit how much you have to: 0-1 drink a day for women. 0-2 drinks a day for men. Know how much alcohol is in your drink. In the [place] one drink equals one 12 oz bottle of beer (355 mL), one 5 oz glass of wine (148 mL), or one 1 oz glass of hard liquor (44 mL). Do not use any products that contain nicotine or tobacco. These products include cigarettes, chewing tobacco, and vaping devices, such as e-cigarettes. If you need help quitting, ask your health care provider. Activity  Follow a regular exercise program to stay fit. This will help you maintain your balance. Ask your health care provider what types of exercise are appropriate for you. If you need a cane or walker, use it as recommended by your health care provider. Wear supportive shoes that have nonskid soles. Safety  Remove any tripping  hazards, such as rugs, cords, and clutter. Install safety equipment such as grab bars in bathrooms and safety rails on stairs. Keep rooms and walkways well-lit. General instructions Talk with your health care provider about your risks for falling. Tell your health care provider if: You fall. Be sure to tell your health care provider about all falls, even ones that seem minor. You feel dizzy, tiredness (fatigue), or off-balance. Take over-the-counter and prescription medicines only as told by your health care provider. These include supplements. Eat a healthy diet and maintain a  healthy weight. A healthy diet includes low-fat dairy products, low-fat (lean) meats, and fiber from whole grains, beans, and lots of fruits and vegetables. Stay current with your vaccines. Schedule regular health, dental, and eye exams. Summary Having a healthy lifestyle and getting preventive care can help to protect your health and wellness after age 83. Screening and testing are the best way to find a health problem early and help you avoid having a fall. Early diagnosis and treatment give you the best chance for managing medical conditions that are more common for people who are older than age 79. Falls are a major cause of broken bones and head injuries in people who are older than age 36. Take precautions to prevent a fall at home. Work with your health care provider to learn what changes you can make to improve your health and wellness and to prevent falls. This information is not intended to replace advice given to you by your health care provider. Make sure you discuss any questions you have with your health care provider. Document Revised: 10/16/2020 Document Reviewed: 10/16/2020 Elsevier Patient Education  [place], MD [facility]

## 2022-08-06 NOTE — Assessment & Plan Note (Signed)
Asymptomatic. Colonoscopy report from 2014 reviewed.  Mild diverticulosis reported. Recommendation was to repeat colonoscopy in 10 years. Agree with PCPs recommendation to follow-up with GI doctor.

## 2022-08-06 NOTE — Assessment & Plan Note (Signed)
As reported from colonoscopy report in 2014. Recommend high-fiber diet. Follow-up with GI doctor as already scheduled.

## 2022-08-12 DIAGNOSIS — H0102B Squamous blepharitis left eye, upper and lower eyelids: Secondary | ICD-10-CM | POA: Diagnosis not present

## 2022-08-12 DIAGNOSIS — H401131 Primary open-angle glaucoma, bilateral, mild stage: Secondary | ICD-10-CM | POA: Diagnosis not present

## 2022-08-12 DIAGNOSIS — H35373 Puckering of macula, bilateral: Secondary | ICD-10-CM | POA: Diagnosis not present

## 2022-08-12 DIAGNOSIS — H0102A Squamous blepharitis right eye, upper and lower eyelids: Secondary | ICD-10-CM | POA: Diagnosis not present

## 2022-09-23 ENCOUNTER — Encounter: Payer: Self-pay | Admitting: Gastroenterology

## 2022-09-23 ENCOUNTER — Ambulatory Visit (INDEPENDENT_AMBULATORY_CARE_PROVIDER_SITE_OTHER): Payer: Medicare Other | Admitting: Internal Medicine

## 2022-09-23 ENCOUNTER — Encounter: Payer: Self-pay | Admitting: Internal Medicine

## 2022-09-23 ENCOUNTER — Ambulatory Visit: Payer: Medicare Other | Admitting: Gastroenterology

## 2022-09-23 VITALS — BP 118/68 | HR 68 | Ht 67.0 in | Wt 157.0 lb

## 2022-09-23 VITALS — BP 118/78 | HR 59 | Temp 98.4°F | Ht 67.0 in | Wt 156.0 lb

## 2022-09-23 DIAGNOSIS — I2583 Coronary atherosclerosis due to lipid rich plaque: Secondary | ICD-10-CM

## 2022-09-23 DIAGNOSIS — R195 Other fecal abnormalities: Secondary | ICD-10-CM | POA: Diagnosis not present

## 2022-09-23 DIAGNOSIS — E785 Hyperlipidemia, unspecified: Secondary | ICD-10-CM | POA: Diagnosis not present

## 2022-09-23 DIAGNOSIS — Z8546 Personal history of malignant neoplasm of prostate: Secondary | ICD-10-CM

## 2022-09-23 DIAGNOSIS — I2510 Atherosclerotic heart disease of native coronary artery without angina pectoris: Secondary | ICD-10-CM

## 2022-09-23 MED ORDER — LORAZEPAM 1 MG PO TABS: 1.0000 mg | ORAL_TABLET | Freq: Three times a day (TID) | ORAL | 1 refills | Status: DC | PRN

## 2022-09-23 MED ORDER — DICYCLOMINE HCL 10 MG PO CAPS: 10.0000 mg | ORAL_CAPSULE | Freq: Three times a day (TID) | ORAL | 0 refills | Status: DC

## 2022-09-23 MED ORDER — NA SULFATE-K SULFATE-MG SULF 17.5-3.13-1.6 GM/177ML PO SOLN: 1.0000 | Freq: Once | ORAL | 0 refills | Status: AC

## 2022-09-23 NOTE — Patient Instructions (Signed)
Magnesium oil spray for cramps  Sign up for [employer] ( via [employer] on your phone or your ipad). If you don't have a Engineering geologist card  - go to [employer] branch. They will set you up in 15 minutes. It is free. You can check out books to read and to listen, check out magazines and newspapers, movies etc.

## 2022-09-23 NOTE — Patient Instructions (Addendum)
You have been scheduled for a colonoscopy. Please follow written instructions given to you at your visit today.  Please pick up your prep supplies at the pharmacy within the next 1-3 days. If you use inhalers (even only as needed), please bring them with you on the day of your procedure.   _______________________________________________________  If your blood pressure at your visit was 140/90 or greater, please contact your primary care physician to follow up on this.  _______________________________________________________  If you are age 53 or older, your body mass index should be between 23-30. Your Body mass index is 24.59 kg/m. If this is out of the aforementioned range listed, please consider follow up with your Primary Care Provider.  If you are age 75 or younger, your body mass index should be between 19-25. Your Body mass index is 24.59 kg/m. If this is out of the aformentioned range listed, please consider follow up with your Primary Care Provider.   ________________________________________________________  The [facility] providers would like to encourage you to use [facility] to communicate with providers for non-urgent requests or questions.  Due to long hold times on the telephone, sending your provider a message by [facility] may be a faster and more efficient way to get a response.  Please allow 48 business hours for a response.  Please remember that this is for non-urgent requests.  _______________________________________________________  It was a pleasure to see you today!  Thank you for trusting me with your gastrointestinal care!

## 2022-09-23 NOTE — Assessment & Plan Note (Signed)
  Cont on Crestor Try EC baby ASA if tolerated

## 2022-09-23 NOTE — Progress Notes (Signed)
Subjective:  Patient ID: Edwin Ramirez, male    DOB: 1941/06/06  Age: 82 y.o. MRN: [id number]  CC: Follow-up ( f/u)   HPI VICTORIOUS KUNDINGER presents for CAD, L shoulder pain, GERD f/u Positive cologuard  Outpatient Medications Prior to Visit  Medication Sig Dispense Refill   b complex vitamins tablet Take 1 tablet by mouth daily. 100 tablet 3   BRIMONIDINE TARTRATE OP Apply to eye.     Cholecalciferol (VITAMIN D3) 50 MCG (2000 UT) capsule Take 1 capsule (2,000 Units total) by mouth daily. 100 capsule 3   latanoprost (XALATAN) 0.005 % ophthalmic solution Place 1 drop into both eyes at bedtime.     lidocaine (XYLOCAINE) 5 % ointment Apply 1 application. topically as needed. 35.44 g 1   neomycin-polymyxin-hydrocortisone (CORTISPORIN) OTIC solution Apply 1-2 drops to toe after soaking once daily 10 mL 0   omeprazole (PRILOSEC) 40 MG capsule TAKE 1 CAPSULE BY MOUTH TWICE A DAY 180 capsule 2   rosuvastatin (CRESTOR) 10 MG tablet TAKE 1 TABLET BY MOUTH EVERY DAY 90 tablet 3   traMADol (ULTRAM) 50 MG tablet Take 1 tablet (50 mg total) by mouth every 6 (six) hours as needed for severe pain. 20 tablet 0   traZODone (DESYREL) 50 MG tablet TAKE 1 TO 2 TABLETS BY MOUTH AT BEDTIME AS NEEDED FOR SLEEP 180 tablet 2   dicyclomine (BENTYL) 10 MG capsule Take 1 capsule (10 mg total) by mouth 4 (four) times daily -  before meals and at bedtime. 100 capsule 0   LORazepam (ATIVAN) 1 MG tablet TAKE 1 TABLET BY MOUTH EVERY 8 HOURS AS NEEDED FOR ANXIETY 90 tablet 1   No facility-administered medications prior to visit.    ROS: Review of Systems  Constitutional:  Negative for appetite change, fatigue and unexpected weight change.  HENT:  Negative for congestion, nosebleeds, sneezing, sore throat and trouble swallowing.   Eyes:  Negative for itching and visual disturbance.  Respiratory:  Negative for cough.   Cardiovascular:  Negative for chest pain, palpitations and leg swelling.   Gastrointestinal:  Negative for abdominal distention, blood in stool, diarrhea and nausea.  Genitourinary:  Negative for frequency and hematuria.  Musculoskeletal:  Positive for arthralgias. Negative for back pain, gait problem, joint swelling and neck pain.  Skin:  Negative for rash.  Neurological:  Negative for dizziness, tremors, speech difficulty and weakness.  Psychiatric/Behavioral:  Negative for agitation, dysphoric mood and sleep disturbance. The patient is not nervous/anxious.     Objective:  BP 118/78 (BP Location: Right Arm, Patient Position: Sitting, Cuff Size: Large)   Pulse (!) 59   Temp 98.4 F (36.9 C) (Oral)   Ht  (1.702 m)   Wt 156 lb (70.8 kg)   SpO2 95%   BMI 24.43 kg/m   BP Readings from Last 3 Encounters:  09/23/22 118/78  08/06/22 108/62  03/20/22 (!) 112/58    Wt Readings from Last 3 Encounters:  09/23/22 156 lb (70.8 kg)  08/06/22 156 lb 2 oz (70.8 kg)  03/20/22 154 lb 3.2 oz (69.9 kg)    Physical Exam Constitutional:      General: He is not in acute distress.    Appearance: He is well-developed.     Comments: NAD  Eyes:     Conjunctiva/sclera: Conjunctivae normal.     Pupils: Pupils are equal, round, and reactive to light.  Neck:     Thyroid: No thyromegaly.     Vascular: No JVD.  Cardiovascular:     Rate and Rhythm: Normal rate and regular rhythm.     Heart sounds: Normal heart sounds. No murmur heard.    No friction rub. No gallop.  Pulmonary:     Effort: Pulmonary effort is normal. No respiratory distress.     Breath sounds: Normal breath sounds. No wheezing or rales.  Chest:     Chest wall: No tenderness.  Abdominal:     General: Bowel sounds are normal. There is no distension.     Palpations: Abdomen is soft. There is no mass.     Tenderness: There is no abdominal tenderness. There is no guarding or rebound.  Musculoskeletal:        General: No tenderness. Normal range of motion.     Cervical back: Normal range of motion.   Lymphadenopathy:     Cervical: No cervical adenopathy.  Skin:    General: Skin is warm and dry.     Findings: No rash.  Neurological:     Mental Status: He is alert and oriented to person, place, and time.     Cranial Nerves: No cranial nerve deficit.     Motor: No abnormal muscle tone.     Coordination: Coordination normal.     Gait: Gait normal.     Deep Tendon Reflexes: Reflexes are normal and symmetric.  Psychiatric:        Behavior: Behavior normal.        Thought Content: Thought content normal.        Judgment: Judgment normal.     Lab Results  Component Value Date   WBC 4.7 03/20/2022   HGB 13.8 03/20/2022   HCT 41.4 03/20/2022   PLT 144.0 (L) 03/20/2022   GLUCOSE 93 03/20/2022   CHOL 120 03/20/2022   TRIG 106.0 03/20/2022   HDL 44.20 03/20/2022   LDLDIRECT 78.8 02/05/2011   LDLCALC 55 03/20/2022   ALT 19 03/20/2022   AST 21 03/20/2022   NA 142 03/20/2022   K 4.2 03/20/2022   CL 105 03/20/2022   CREATININE 1.15 03/20/2022   BUN 14 03/20/2022   CO2 31 03/20/2022   TSH 1.88 03/20/2022   PSA 0.00 (L) 03/20/2022   HGBA1C 5.7 03/07/2014    DG Ribs Unilateral W/Chest Right  Result Date: 10/23/2021 CLINICAL DATA:  Right-sided rib pain.  Pleuritic chest pain. EXAM: RIGHT RIBS AND CHEST - 3+ VIEW COMPARISON:  Chest radiograph on 06/29/2021 FINDINGS: No fracture or other bone lesions are seen involving the ribs. There is no evidence of pneumothorax or pleural effusion. Both lungs are clear. Heart size and mediastinal contours are within normal limits. IMPRESSION: Negative. Electronically Signed   By: Danae Orleans M.D.   On: 10/23/2021 17:47    Assessment & Plan:   Problem List Items Addressed This Visit       Cardiovascular and Mediastinum   CAD (coronary artery disease) - Primary     Cont on Crestor Try EC baby ASA if tolerated        Other   Dyslipidemia    On Crestor      Relevant Orders   CBC with Differential/Platelet   Comprehensive metabolic  panel   TSH   CK   Positive colorectal cancer screening using Cologuard test    GI appt today w/Dr Myrtie Neither      Relevant Orders   CBC with Differential/Platelet   Comprehensive metabolic panel   TSH   PROSTATE CANCER, HX OF    Monitoring PSA  Relevant Orders   CBC with Differential/Platelet   Comprehensive metabolic panel   TSH      Meds ordered this encounter  Medications   dicyclomine (BENTYL) 10 MG capsule    Sig: Take 1 capsule (10 mg total) by mouth 4 (four) times daily -  before meals and at bedtime.    Dispense:  100 capsule    Refill:  0   LORazepam (ATIVAN) 1 MG tablet    Sig: Take 1 tablet (1 mg total) by mouth every 8 (eight) hours as needed. for anxiety    Dispense:  90 tablet    Refill:  1      Follow-up: Return in about 6 months (around 03/25/2023) for Wellness Exam.  Sonda Primes, MD

## 2022-09-23 NOTE — Assessment & Plan Note (Signed)
GI appt today w/Dr Myrtie Neither

## 2022-09-23 NOTE — Progress Notes (Signed)
[facility] Consult Note:  History: Edwin Ramirez 09/23/2022  Referring provider: Tresa Garter, MD  Reason for consult/chief complaint: Colonoscopy (Pt states he is doing well today, pt here to discuss an colonoscopy)   Subjective  HPI Colonoscopy with Dr. [name] May 2014 for a reported history of adenomatous colon polyps.  No polyps on the 2014 exam, complete exam with good prep.  She recommended a 10-year recall, though the patient was 37 at that time. Primary care recently ordered a Cologuard which returned positive. Mother had CRC in her 35s.  [name] denies chronic abdominal pain, altered bowel habits or rectal bleeding.  He has carried a diagnosis of reflux for decades and takes omeprazole regularly.  Occasionally he still gets a "stitch in his side" in the right upper quadrant.  Denies nausea vomiting or weight loss.   ROS:  Review of Systems  Constitutional:  Negative for appetite change and unexpected weight change.  HENT:  Negative for mouth sores and voice change.   Eyes:  Negative for pain and redness.  Respiratory:  Negative for cough and shortness of breath.   Cardiovascular:  Negative for chest pain and palpitations.  Genitourinary:  Negative for dysuria and hematuria.  Musculoskeletal:  Negative for arthralgias and myalgias.  Skin:  Negative for pallor and rash.  Neurological:  Negative for weakness and headaches.  Hematological:  Negative for adenopathy.     Past Medical History: Past Medical History:  Diagnosis Date   Basal cell cancer    Dr Nicholas Lose   DDD (degenerative disc disease)    Diverticulosis    Esophageal stricture    Dr [name]   Gastritis    GERD (gastroesophageal reflux disease)    Sullivan Lone syndrome    Glaucoma    Hepatic cyst    History of prostate cancer    Dr Laverle Patter   Hx of adenomatous colonic polyps    Hyperlipidemia    has had good cholesterol count for years.   Internal hemorrhoids    Pulmonary  nodule    not seen on F/U 2014   TMJ (dislocation of temporomandibular joint)      Past Surgical History: Past Surgical History:  Procedure Laterality Date   APPENDECTOMY     cataract surgery  2012   bilateral   CHOLECYSTECTOMY  1985   COLONOSCOPY W/ POLYPECTOMY  2004   Negative 2009 & 2013 Dr.Brodie   ESOPHAGEAL DILATION  2004   MOHS SURGERY     Basal Cell   PROSTATECTOMY  07/2005   Dr.Borden   ROTATOR CUFF REPAIR      X 2-Left; Dr Eulah Pont     Family History: Family History  Problem Relation Age of Onset   Multiple myeloma Father    Colon cancer Mother 76   Heart failure Mother    Heart attack Maternal Uncle 52   Diabetes Neg Hx    Stroke Neg Hx    Esophageal cancer Neg Hx    Rectal cancer Neg Hx    Stomach cancer Neg Hx     Social History: Social History   Socioeconomic History   Marital status: Married    Spouse name: Not on file   Number of children: 2   Years of education: Not on file   Highest education level: Not on file  Occupational History   Occupation: retired   Occupation: retired  Tobacco Use   Smoking status: Never   Smokeless tobacco: Never  Building services engineer  Use: Never used  Substance and Sexual Activity   Alcohol use: Yes    Alcohol/week: 5.0 standard drinks of alcohol    Types: 5 Glasses of wine per week    Comment: Socially   Drug use: No   Sexual activity: Not Currently  Other Topics Concern   Not on file  Social History Narrative   DAILY CAFFEINE   Social Determinants of Health   Financial Resource Strain: Low Risk  (04/05/2019)   Overall Financial Resource Strain (CARDIA)    Difficulty of Paying Living Expenses: Not hard at all  Food Insecurity: No Food Insecurity (04/05/2019)   Hunger Vital Sign    Worried About Running Out of Food in the Last Year: Never true    Ran Out of Food in the Last Year: Never true  Transportation Needs: No Transportation Needs (04/05/2019)   PRAPARE - Scientist, research (physical sciences) (Medical): No    Lack of Transportation (Non-Medical): No  Physical Activity: Sufficiently Active (04/05/2019)   Exercise Vital Sign    Days of Exercise per Week: 5 days    Minutes of Exercise per Session: 60 min  Stress: No Stress Concern Present (04/05/2019)   [employer] of Occupational Health - Occupational Stress Questionnaire    Feeling of Stress : Not at all  Social Connections: Socially Integrated (04/05/2019)   Social Connection and Isolation Panel [NHANES]    Frequency of Communication with Friends and Family: More than three times a week    Frequency of Social Gatherings with Friends and Family: More than three times a week    Attends Religious Services: More than 4 times per year    Active Member of [employer] or Organizations: Yes    Attends Engineer, structural: More than 4 times per year    Marital Status: Married    Allergies: Allergies  Allergen Reactions   Codeine     REACTION: agitation, mental status changes with high doses of codeine post op Can take Tramadol OK, low dose Norco ok   Nabumetone     Mental status changes post op with Relafen   Pneumovax [Pneumococcal Polysaccharide Vaccine]     swelling   Prevnar 13 [Pneumococcal 13-Val Conj Vacc]     rash   Aspirin     gastritis    Outpatient Meds: Current Outpatient Medications  Medication Sig Dispense Refill   b complex vitamins tablet Take 1 tablet by mouth daily. 100 tablet 3   BRIMONIDINE TARTRATE OP Apply to eye.     Cholecalciferol (VITAMIN D3) 50 MCG (2000 UT) capsule Take 1 capsule (2,000 Units total) by mouth daily. 100 capsule 3   dicyclomine (BENTYL) 10 MG capsule Take 1 capsule (10 mg total) by mouth 4 (four) times daily -  before meals and at bedtime. 100 capsule 0   latanoprost (XALATAN) 0.005 % ophthalmic solution Place 1 drop into both eyes at bedtime.     lidocaine (XYLOCAINE) 5 % ointment Apply 1 application. topically as needed. 35.44 g 1   LORazepam (ATIVAN)  1 MG tablet Take 1 tablet (1 mg total) by mouth every 8 (eight) hours as needed. for anxiety 90 tablet 1   Na Sulfate-K Sulfate-Mg Sulf 17.5-3.13-1.6 GM/177ML SOLN Take 1 kit by mouth once for 1 dose. 354 mL 0   neomycin-polymyxin-hydrocortisone (CORTISPORIN) OTIC solution Apply 1-2 drops to toe after soaking once daily 10 mL 0   omeprazole (PRILOSEC) 40 MG capsule TAKE 1 CAPSULE BY MOUTH TWICE A  DAY 180 capsule 2   rosuvastatin (CRESTOR) 10 MG tablet TAKE 1 TABLET BY MOUTH EVERY DAY 90 tablet 3   traMADol (ULTRAM) 50 MG tablet Take 1 tablet (50 mg total) by mouth every 6 (six) hours as needed for severe pain. 20 tablet 0   traZODone (DESYREL) 50 MG tablet TAKE 1 TO 2 TABLETS BY MOUTH AT BEDTIME AS NEEDED FOR SLEEP 180 tablet 2   No current facility-administered medications for this visit.      ___________________________________________________________________ Objective   Exam:  BP 118/68   Pulse 68   Ht 5\' 7"  (1.702 m)   Wt 157 lb (71.2 kg)   BMI 24.59 kg/m  Wt Readings from Last 3 Encounters:  09/23/22 157 lb (71.2 kg)  09/23/22 156 lb (70.8 kg)  08/06/22 156 lb 2 oz (70.8 kg)    General: Well-appearing Eyes: sclera anicteric, no redness ENT: oral mucosa moist without lesions, no cervical or supraclavicular lymphadenopathy CV: Regular without appreciable murmur, no JVD, no peripheral edema Resp: clear to auscultation bilaterally, normal RR and effort noted GI: soft, no tenderness, with active bowel sounds. No guarding or palpable organomegaly noted. Skin; warm and dry, no rash or jaundice noted Neuro: awake, alert and oriented x 3. Normal gross motor function and fluent speech  Labs:     Latest Ref Rng & Units 03/20/2022   10:31 AM 06/29/2021   12:03 PM 03/19/2021   10:30 AM  CBC  WBC 4.0 - 10.5 K/uL 4.7  5.0  4.7   Hemoglobin 13.0 - 17.0 g/dL 16.1  09.6  04.5   Hematocrit 39.0 - 52.0 % 41.4  43.8  40.2   Platelets 150.0 - 400.0 K/uL 144.0  163  143.0    Lab  Results  Component Value Date   TSH 1.88 03/20/2022      Latest Ref Rng & Units 03/20/2022   10:31 AM 10/01/2021   10:25 AM 06/29/2021   12:03 PM  CMP  Glucose 70 - 99 mg/dL 93  409  91   BUN 6 - 23 mg/dL 14  17  14    Creatinine 0.40 - 1.50 mg/dL 8.11  9.14  7.82   Sodium 135 - 145 mEq/L 142  141  139   Potassium 3.5 - 5.1 mEq/L 4.2  4.7  4.2   Chloride 96 - 112 mEq/L 105  105  104   CO2 19 - 32 mEq/L 31  31  28    Calcium 8.4 - 10.5 mg/dL 9.5  9.4  9.5   Total Protein 6.0 - 8.3 g/dL 6.7  6.8    Total Bilirubin 0.2 - 1.2 mg/dL 1.2  0.9    Alkaline Phos 39 - 117 U/L 59  75    AST 0 - 37 U/L 21  26    ALT 0 - 53 U/L 19  26        Assessment: Encounter Diagnosis  Name Primary?   Positive colorectal cancer screening using Cologuard test Yes    We discussed that this positive result most likely means 1 or more precancerous polyps, less likely false positive, less likely colon cancer. Not withstanding that Cologuard is for average risk patients, a positive result now warrants a colonoscopy.  Fortunately, his health appears quite good and I believe he will do well with it.  He was agreeable after discussion of procedure and risks.  The benefits and risks of the planned procedure were described in detail with the patient or (when appropriate) their  health care proxy.  Risks were outlined as including, but not limited to, bleeding, infection, perforation, adverse medication reaction leading to cardiac or pulmonary decompensation, pancreatitis (if ERCP).  The limitation of incomplete mucosal visualization was also discussed.  No guarantees or warranties were given.    Thank you for the courtesy of this consult.  Please call me with any questions or concerns.  Charlie Pitter III  CC: Referring provider noted above

## 2022-09-23 NOTE — Assessment & Plan Note (Signed)
Monitoring PSA 

## 2022-09-23 NOTE — Assessment & Plan Note (Signed)
On Crestor 

## 2022-09-24 DIAGNOSIS — M25512 Pain in left shoulder: Secondary | ICD-10-CM | POA: Diagnosis not present

## 2022-10-01 ENCOUNTER — Encounter: Payer: Self-pay | Admitting: Gastroenterology

## 2022-10-10 ENCOUNTER — Ambulatory Visit (AMBULATORY_SURGERY_CENTER): Payer: Medicare Other | Admitting: Gastroenterology

## 2022-10-10 ENCOUNTER — Encounter: Payer: Self-pay | Admitting: Gastroenterology

## 2022-10-10 VITALS — BP 122/59 | HR 57 | Temp 99.5°F | Resp 15 | Ht 67.0 in | Wt 157.0 lb

## 2022-10-10 DIAGNOSIS — R195 Other fecal abnormalities: Secondary | ICD-10-CM | POA: Diagnosis not present

## 2022-10-10 DIAGNOSIS — D123 Benign neoplasm of transverse colon: Secondary | ICD-10-CM | POA: Diagnosis not present

## 2022-10-10 DIAGNOSIS — D122 Benign neoplasm of ascending colon: Secondary | ICD-10-CM

## 2022-10-10 DIAGNOSIS — Z1211 Encounter for screening for malignant neoplasm of colon: Secondary | ICD-10-CM | POA: Diagnosis not present

## 2022-10-10 MED ORDER — SODIUM CHLORIDE 0.9 % IV SOLN
500.0000 mL | Freq: Once | INTRAVENOUS | Status: DC
Start: 2022-10-10 — End: 2022-10-10

## 2022-10-10 NOTE — Op Note (Signed)
[facility] Patient Name: Raji Glinski Procedure Date: 10/10/2022 11:12 AM MRN: [id number] Endoscopist: Sherilyn Cooter L. Myrtie Neither , MD, [id number] Age: 82 Referring MD:  Date of Birth: [birth year] Gender: Male Account #: [id number] Procedure:                Colonoscopy Indications:              Positive Cologuard test Medicines:                Monitored Anesthesia Care Procedure:                Pre-Anesthesia Assessment:                           - Prior to the procedure, a History and Physical                            was performed, and patient medications and                            allergies were reviewed. The patient's tolerance of                            previous anesthesia was also reviewed. The risks                            and benefits of the procedure and the sedation                            options and risks were discussed with the patient.                            All questions were answered, and informed consent                            was obtained. Prior Anticoagulants: The patient has                            taken no anticoagulant or antiplatelet agents. ASA                            Grade Assessment: II - A patient with mild systemic                            disease. After reviewing the risks and benefits,                            the patient was deemed in satisfactory condition to                            undergo the procedure.                           After obtaining informed consent, the colonoscope  was passed under direct vision. Throughout the                            procedure, the patient's blood pressure, pulse, and                            oxygen saturations were monitored continuously. The                            Olympus CF-HQ190L ([id number]) Colonoscope was                            introduced through the anus and advanced to the the                            cecum, identified by appendiceal  orifice and                            ileocecal valve. The colonoscopy was performed                            without difficulty. The patient tolerated the                            procedure well. The quality of the bowel                            preparation was excellent. The ileocecal valve,                            appendiceal orifice, and rectum were photographed. Scope In: 11:28:11 AM Scope Out: 11:42:43 AM Scope Withdrawal Time: 0 hours 10 minutes 3 seconds  Total Procedure Duration: 0 hours 14 minutes 32 seconds  Findings:                 The perianal and digital rectal examinations were                            normal.                           Repeat examination of right colon under NBI                            performed.                           Multiple diverticula were found in the left colon                            and right colon.                           Four sessile polyps were found in the ascending  colon. The polyps were 4 to 6 mm in size. These                            polyps were removed with a cold snare. Resection                            and retrieval were complete.                           A 5 mm polyp was found in the transverse colon. The                            polyp was sessile. The polyp was removed with a                            cold snare. Resection and retrieval were complete.                           Internal hemorrhoids were found.                           The exam was otherwise without abnormality on                            direct and retroflexion views. Complications:            No immediate complications. Estimated Blood Loss:     Estimated blood loss was minimal. Impression:               - Diverticulosis in the left colon and in the right                            colon.                           - Four 4 to 6 mm polyps in the ascending colon,                            removed with a  cold snare. Resected and retrieved.                           - One 5 mm polyp in the transverse colon, removed                            with a cold snare. Resected and retrieved.                           - Internal hemorrhoids.                           - The examination was otherwise normal on direct                            and retroflexion views. Recommendation:           -  Patient has a contact number available for                            emergencies. The signs and symptoms of potential                            delayed complications were discussed with the                            patient. Return to normal activities tomorrow.                            Written discharge instructions were provided to the                            patient.                           - Resume previous diet.                           - Continue present medications.                           - Await pathology results.                           - No repeat surveillance colonoscopy. Kami Kube L. Myrtie Neither, MD 10/10/2022 11:47:10 AM This report has been signed electronically.

## 2022-10-10 NOTE — Progress Notes (Signed)
Called to room to assist during endoscopic procedure.  Patient ID and intended procedure confirmed with present staff. Received instructions for my participation in the procedure from the performing physician.  

## 2022-10-10 NOTE — Progress Notes (Signed)
No changes to clinical history since GI office visit on 09/23/22.  The patient is appropriate for an endoscopic procedure in the ambulatory setting.  - Keasha Malkiewicz Danis, MD    

## 2022-10-10 NOTE — Patient Instructions (Signed)
Handouts Provided:  Polyps and Diverticulosis  No repeat surveillance colonoscopy  YOU HAD AN ENDOSCOPIC PROCEDURE TODAY AT THE [facility]:   Refer to the procedure report that was given to you for any specific questions about what was found during the examination.  If the procedure report does not answer your questions, please call your gastroenterologist to clarify.  If you requested that your care partner not be given the details of your procedure findings, then the procedure report has been included in a sealed envelope for you to review at your convenience later.  YOU SHOULD EXPECT: Some feelings of bloating in the abdomen. Passage of more gas than usual.  Walking can help get rid of the air that was put into your GI tract during the procedure and reduce the bloating. If you had a lower endoscopy (such as a colonoscopy or flexible sigmoidoscopy) you may notice spotting of blood in your stool or on the toilet paper. If you underwent a bowel prep for your procedure, you may not have a normal bowel movement for a few days.  Please Note:  You might notice some irritation and congestion in your nose or some drainage.  This is from the oxygen used during your procedure.  There is no need for concern and it should clear up in a day or so.  SYMPTOMS TO REPORT IMMEDIATELY:  Following lower endoscopy (colonoscopy or flexible sigmoidoscopy):  Excessive amounts of blood in the stool  Significant tenderness or worsening of abdominal pains  Swelling of the abdomen that is new, acute  Fever of 100F or higher  For urgent or emergent issues, a gastroenterologist can be reached at any hour by calling [phone]. Do not use MyChart messaging for urgent concerns.    DIET:  We do recommend a small meal at first, but then you may proceed to your regular diet.  Drink plenty of fluids but you should avoid alcoholic beverages for 24 hours.  ACTIVITY:  You should plan to take it easy for the  rest of today and you should NOT DRIVE or use heavy machinery until tomorrow (because of the sedation medicines used during the test).    FOLLOW UP: Our staff will call the number listed on your records the next business day following your procedure.  We will call around 7:15- 8:00 am to check on you and address any questions or concerns that you may have regarding the information given to you following your procedure. If we do not reach you, we will leave a message.     If any biopsies were taken you will be contacted by phone or by letter within the next 1-3 weeks.  Please call us at [phone] if you have not heard about the biopsies in 3 weeks.    SIGNATURES/CONFIDENTIALITY: You and/or your care partner have signed paperwork which will be entered into your electronic medical record.  These signatures attest to the fact that that the information above on your After Visit Summary has been reviewed and is understood.  Full responsibility of the confidentiality of this discharge information lies with you and/or your care-partner.

## 2022-10-10 NOTE — Progress Notes (Signed)
Uneventful anesthetic. Report to pacu rn. Vss. Care resumed by rn. 

## 2022-10-11 ENCOUNTER — Telehealth: Payer: Self-pay

## 2022-10-11 NOTE — Telephone Encounter (Signed)
Follow up call placed, VM obtained and message left. 

## 2022-10-16 ENCOUNTER — Other Ambulatory Visit: Payer: Self-pay | Admitting: Internal Medicine

## 2022-10-17 ENCOUNTER — Encounter: Payer: Self-pay | Admitting: Gastroenterology

## 2022-10-22 ENCOUNTER — Ambulatory Visit
Admission: EM | Admit: 2022-10-22 | Discharge: 2022-10-22 | Disposition: A | Discharge status: Home / Self Care | Payer: Medicare Other

## 2022-10-22 DIAGNOSIS — J019 Acute sinusitis, unspecified: Secondary | ICD-10-CM

## 2022-10-22 MED ORDER — AMOXICILLIN-POT CLAVULANATE 875-125 MG PO TABS: 1.0000 | ORAL_TABLET | Freq: Two times a day (BID) | ORAL | 0 refills | Status: AC

## 2022-10-22 NOTE — ED Provider Notes (Signed)
[facility]    CSN: [id number] Arrival date & time: 10/22/22  1312      History   Chief Complaint Chief Complaint  Patient presents with   Otalgia    HPI Edwin Ramirez is a 82 y.o. male. C/o L ear pain 2-3 weeks ago when at a play. Had to remove his hearing aid and the pain improved. Has not worn his L hearing aid until 10/20/22 - was fine that day. The next day, yesterday, when watching TV ear pain returned. He removed his hearing aid again but this time the pain remains. Pain radiates down to L neck under ear. Also he states he has had "bronchitis" for the last 2-3 weeks (same time frame of initial ear pain) and is coughing (occaionally brings up phlegm), does not have SOB. Has been treating with sudafed and robitussin, but it just isn't going away. Denies nasal congestion or seasonal allergies. Denies post nasal drainage   Otalgia   Past Medical History:  Diagnosis Date   Basal cell cancer    Dr Nicholas Lose   DDD (degenerative disc disease)    Diverticulosis    Esophageal stricture    Dr [name]   Gastritis    GERD (gastroesophageal reflux disease)    Sullivan Lone syndrome    Glaucoma    Hepatic cyst    History of prostate cancer    Dr Laverle Patter   Hx of adenomatous colonic polyps    Hyperlipidemia    has had good cholesterol count for years.   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   PULMONARY NODULE 05/19/2008   HEMORRHOIDS, INTERNAL 05/18/2008   ESOPHAGEAL STRICTURE 05/18/2008   Diverticulosis of colon 05/18/2008   COLONIC POLYPS, ADENOMATOUS, HX OF 05/18/2008   Glaucoma  associated with ocular inflammations(365.62) 05/13/2008   PROSTATE CANCER, HX OF 01/12/2008   HEPATIC CYST 12/01/2007   GERD 09/03/2007   Dyslipidemia 03/03/2007   Skin cancer 10/29/2006    Past Surgical History:  Procedure Laterality Date   APPENDECTOMY     cataract surgery  2012   bilateral   CHOLECYSTECTOMY  1985   COLONOSCOPY W/ POLYPECTOMY  2004   Negative 2009 & 2013 Dr.Brodie   ESOPHAGEAL DILATION  2004   MOHS SURGERY     Basal Cell   PROSTATECTOMY  07/2005   Dr.Borden   ROTATOR CUFF REPAIR      X 2-Left; Dr Eulah Pont       Home Medications    Prior to Admission medications   Medication Sig Start Date End Date Taking? Authorizing Provider  amoxicillin-clavulanate (AUGMENTIN) 875-125 MG tablet Take 1 tablet by mouth 2 (two) times daily for 10 days. 10/22/22 11/01/22 Yes Cathlyn Parsons, NP  brimonidine (ALPHAGAN) 0.2 % ophthalmic solution Place 1 drop into both eyes. 10/22/22  Yes [provider]  b complex vitamins tablet Take 1 tablet by mouth daily. 03/09/19   Plotnikov, Georgina Quint, MD  BRIMONIDINE TARTRATE OP Apply to eye.    [provider]  Cholecalciferol (VITAMIN D3) 50 MCG (2000 UT) capsule Take 1 capsule (2,000 Units total)  by mouth daily. 03/09/19   Plotnikov, Georgina Quint, MD  dicyclomine (BENTYL) 10 MG capsule TAKE 1 CAPSULE BY MOUTH 4 TIMES DAILY, BEFORE MEALS AND AT BEDTIME. 10/17/22   Plotnikov, Georgina Quint, MD  latanoprost (XALATAN) 0.005 % ophthalmic solution Place 1 drop into both eyes at bedtime.    [provider]  omeprazole (PRILOSEC) 40 MG capsule TAKE 1 CAPSULE BY MOUTH TWICE A DAY 05/07/22   Plotnikov, Georgina Quint, MD  rosuvastatin (CRESTOR) 10 MG tablet TAKE 1 TABLET BY MOUTH EVERY DAY 05/25/22   Plotnikov, Georgina Quint, MD  traMADol (ULTRAM) 50 MG tablet Take 1 tablet (50 mg total) by mouth every 6 (six) hours as needed for severe pain. 05/10/20   Plotnikov, Georgina Quint, MD  traZODone (DESYREL) 50 MG tablet TAKE 1 TO 2 TABLETS BY MOUTH AT  BEDTIME AS NEEDED FOR SLEEP 05/20/22   Plotnikov, Georgina Quint, MD    Family History Family History  Problem Relation Age of Onset   Multiple myeloma Father    Colon cancer Mother 66   Heart failure Mother    Heart attack Maternal Uncle 44   Diabetes Neg Hx    Stroke Neg Hx    Esophageal cancer Neg Hx    Rectal cancer Neg Hx    Stomach cancer Neg Hx     Social History Social History   Tobacco Use   Smoking status: Never   Smokeless tobacco: Never  Vaping Use   Vaping Use: Never used  Substance Use Topics   Alcohol use: Yes    Alcohol/week: 5.0 standard drinks of alcohol    Types: 5 Glasses of wine per week    Comment: Socially   Drug use: No     Allergies   Codeine, Nabumetone, Pneumovax [pneumococcal polysaccharide vaccine], Prevnar 13 [pneumococcal 13-val conj vacc], and Aspirin   Review of Systems Review of Systems  HENT:  Positive for ear pain.      Physical Exam Triage Vital Signs ED Triage Vitals  Enc Vitals Group     BP 10/22/22 1347 114/69     Pulse Rate 10/22/22 1347 69     Resp 10/22/22 1347 18     Temp 10/22/22 1347 97.6 F (36.4 C)     Temp Source 10/22/22 1347 Oral     SpO2 10/22/22 1347 95 %     Weight --      Height --      Head Circumference --      Peak Flow --      Pain Score 10/22/22 1348 7     Pain Loc --      Pain Edu? --      Excl. in GC? --    No data found.  Updated Vital Signs BP 114/69 (BP Location: Left Arm)   Pulse 69   Temp 97.6 F (36.4 C) (Oral)   Resp 18   SpO2 95%   Visual Acuity Right Eye Distance:   Left Eye Distance:   Bilateral Distance:    Right Eye Near:   Left Eye Near:    Bilateral Near:     Physical Exam Constitutional:      General: He is not in acute distress.    Appearance: Normal appearance. He is not ill-appearing.  HENT:     Left Ear: Tympanic membrane, ear canal and external ear normal.     Nose: No congestion.     Left Sinus: No maxillary sinus tenderness or frontal sinus  tenderness.  Comments: Does clear throat occasionally Cardiovascular:     Rate and Rhythm: Normal rate and regular rhythm.  Pulmonary:     Effort: Pulmonary effort is normal.     Breath sounds: Normal breath sounds.     Comments: No cough observed Lymphadenopathy:     Head:     Right side of head: No submandibular, preauricular or posterior auricular adenopathy.     Left side of head: No submandibular, preauricular or posterior auricular adenopathy.  Neurological:     Mental Status: He is alert.      UC Treatments / Results  Labs (all labs ordered are listed, but only abnormal results are displayed) Labs Reviewed - No data to display  EKG   Radiology No results found.  Procedures Procedures (including critical care time)  Medications Ordered in UC Medications - No data to display  Initial Impression / Assessment and Plan / UC Course  I have reviewed the triage vital signs and the nursing notes.  Pertinent labs & imaging results that were available during my care of the patient were reviewed by me and considered in my medical decision making (see chart for details).    I see nothing on exam to indicate cause of ear pain. Pt has had illness symptoms for last 2-3 weeks and could have a sinus infection causing ear pain. Discussed options with pt. Will treat for sinus infection and if ear pain no better in a week, he will follow up with his audiologist.   Final Clinical Impressions(s) / UC Diagnoses   Final diagnoses:  Acute non-recurrent sinusitis, unspecified location     Discharge Instructions      I suspect you may have an infection that is causing your ear pain, especially since you have also been sick for the last 2-3 weeks. If you are still having ear pain in another week, contact [facility] and have them check your hearing aid.    ED Prescriptions     Medication Sig Dispense Auth. Provider   amoxicillin-clavulanate (AUGMENTIN) 875-125 MG tablet Take 1  tablet by mouth 2 (two) times daily for 10 days. 20 tablet Cathlyn Parsons, NP      PDMP not reviewed this encounter.   Cathlyn Parsons, NP 10/22/22 1416

## 2022-10-22 NOTE — Discharge Instructions (Signed)
I suspect you may have an infection that is causing your ear pain, especially since you have also been sick for the last 2-3 weeks. If you are still having ear pain in another week, contact [facility] and have them check your hearing aid.

## 2022-10-22 NOTE — ED Triage Notes (Signed)
Patient with c/o left ear pain. Patient states he wears hearing aids and 2 weeks ago he was at a play and had the hearing aids on a low setting patient states the music was really loud and he took his hearing aids out. Now the pain in his left ear is awful and he has been unable to wear his hearing aids. Patient also c/o "bronchitis" for about 3 weeks.

## 2022-11-13 ENCOUNTER — Encounter: Payer: Self-pay | Admitting: Internal Medicine

## 2022-11-13 ENCOUNTER — Ambulatory Visit (INDEPENDENT_AMBULATORY_CARE_PROVIDER_SITE_OTHER): Payer: Medicare Other | Admitting: Internal Medicine

## 2022-11-13 VITALS — BP 110/70 | HR 53 | Temp 97.9°F | Ht 67.0 in | Wt 153.0 lb

## 2022-11-13 DIAGNOSIS — I2583 Coronary atherosclerosis due to lipid rich plaque: Secondary | ICD-10-CM | POA: Diagnosis not present

## 2022-11-13 DIAGNOSIS — T148XXA Other injury of unspecified body region, initial encounter: Secondary | ICD-10-CM

## 2022-11-13 DIAGNOSIS — I2510 Atherosclerotic heart disease of native coronary artery without angina pectoris: Secondary | ICD-10-CM | POA: Diagnosis not present

## 2022-11-13 DIAGNOSIS — H9202 Otalgia, left ear: Secondary | ICD-10-CM | POA: Diagnosis not present

## 2022-11-13 MED ORDER — METHYLPREDNISOLONE 4 MG PO TBPK: ORAL_TABLET | ORAL | 0 refills | Status: DC

## 2022-11-13 MED ORDER — DICYCLOMINE HCL 10 MG PO CAPS: ORAL_CAPSULE | ORAL | 0 refills | Status: DC

## 2022-11-13 NOTE — Assessment & Plan Note (Addendum)
  New sharp severe pain in the L ear that started w/an opening act loud music - [facility] 4 wks ago. [name] went to [facility] - took the abx. Sound wave injury. Start Medrol pac, Afrin

## 2022-11-13 NOTE — Patient Instructions (Addendum)
Afrin nasal spray x 5 days Arnica gel for bruises

## 2022-11-13 NOTE — Progress Notes (Signed)
Subjective:  Patient ID: Edwin Ramirez, male    DOB: 1940-09-28  Age: 82 y.o. MRN: [id number]  CC: Ear Pain   HPI Edwin Ramirez presents for sharp severe pain in the L ear that started w/an opening act loud music - [facility].  Edwin [facility] - [facility] C/o dull ache below the ear; overall better  C/o bruising       Outpatient Medications Prior to Visit  Medication Sig Dispense Refill   b complex vitamins tablet Take 1 tablet by mouth daily. 100 tablet 3   brimonidine (ALPHAGAN) 0.2 % ophthalmic solution Place 1 drop into both eyes.     BRIMONIDINE TARTRATE OP Apply to eye.     Cholecalciferol (VITAMIN D3) 50 MCG (2000 UT) capsule Take 1 capsule (2,000 Units total) by mouth daily. 100 capsule 3   latanoprost (XALATAN) 0.005 % ophthalmic solution Place 1 drop into both eyes at bedtime.     omeprazole (PRILOSEC) 40 MG capsule TAKE 1 CAPSULE BY MOUTH TWICE A DAY 180 capsule 2   rosuvastatin (CRESTOR) 10 MG tablet TAKE 1 TABLET BY MOUTH EVERY DAY 90 tablet 3   traMADol (ULTRAM) 50 MG tablet Take 1 tablet (50 mg total) by mouth every 6 (six) hours as needed for severe pain. 20 tablet 0   traZODone (DESYREL) 50 MG tablet TAKE 1 TO 2 TABLETS BY MOUTH AT BEDTIME AS NEEDED FOR SLEEP 180 tablet 2   dicyclomine (BENTYL) 10 MG capsule TAKE 1 CAPSULE BY MOUTH 4 TIMES DAILY, BEFORE MEALS AND AT BEDTIME. 100 capsule 0   No facility-administered medications prior to visit.    ROS: Review of Systems  Constitutional:  Negative for appetite change, fatigue and unexpected weight change.  HENT:  Positive for ear pain. Negative for congestion, nosebleeds, sneezing, sore throat and trouble swallowing.   Eyes:  Negative for itching and visual disturbance.  Respiratory:  Negative for cough.   Cardiovascular:  Negative for chest pain, palpitations and leg swelling.  Gastrointestinal:  Negative for abdominal distention, blood in stool, diarrhea and nausea.  Genitourinary:   Negative for frequency and hematuria.  Musculoskeletal:  Negative for back pain, gait problem, joint swelling and neck pain.  Skin:  Negative for rash.  Neurological:  Negative for dizziness, tremors, speech difficulty and weakness.  Psychiatric/Behavioral:  Negative for agitation, dysphoric mood and sleep disturbance. The patient is not nervous/anxious.     Objective:  BP 110/70 (BP Location: Right Arm, Patient Position: Sitting, Cuff Size: Large)   Pulse (!) 53   Temp 97.9 F (36.6 C) (Oral)   Ht 5\' 7"  (1.702 m)   Wt 153 lb (69.4 kg)   SpO2 96%   BMI 23.96 kg/m   BP Readings from Last 3 Encounters:  11/13/22 110/70  10/22/22 114/69  10/10/22 (!) 122/59    Wt Readings from Last 3 Encounters:  11/13/22 153 lb (69.4 kg)  10/10/22 157 lb (71.2 kg)  09/23/22 157 lb (71.2 kg)    Physical Exam Constitutional:      General: He is not in acute distress.    Appearance: He is well-developed.     Comments: NAD  Eyes:     Conjunctiva/sclera: Conjunctivae normal.     Pupils: Pupils are equal, round, and reactive to light.  Neck:     Thyroid: No thyromegaly.     Vascular: No JVD.  Cardiovascular:     Rate and Rhythm: Normal rate and regular rhythm.  Heart sounds: Normal heart sounds. No murmur heard.    No friction rub. No gallop.  Pulmonary:     Effort: Pulmonary effort is normal. No respiratory distress.     Breath sounds: Normal breath sounds. No wheezing or rales.  Chest:     Chest wall: No tenderness.  Abdominal:     General: Bowel sounds are normal. There is no distension.     Palpations: Abdomen is soft. There is no mass.     Tenderness: There is no abdominal tenderness. There is no guarding or rebound.  Musculoskeletal:        General: No tenderness. Normal range of motion.     Cervical back: Normal range of motion.  Lymphadenopathy:     Cervical: No cervical adenopathy.  Skin:    General: Skin is warm and dry.     Findings: No rash.  Neurological:      Mental Status: He is alert and oriented to person, place, and time.     Cranial Nerves: No cranial nerve deficit.     Motor: No abnormal muscle tone.     Coordination: Coordination normal.     Gait: Gait normal.     Deep Tendon Reflexes: Reflexes are normal and symmetric.  Psychiatric:        Behavior: Behavior normal.        Thought Content: Thought content normal.        Judgment: Judgment normal.     Lab Results  Component Value Date   WBC 4.7 03/20/2022   HGB 13.8 03/20/2022   HCT 41.4 03/20/2022   PLT 144.0 (L) 03/20/2022   GLUCOSE 93 03/20/2022   CHOL 120 03/20/2022   TRIG 106.0 03/20/2022   HDL 44.20 03/20/2022   LDLDIRECT 78.8 02/05/2011   LDLCALC 55 03/20/2022   ALT 19 03/20/2022   AST 21 03/20/2022   NA 142 03/20/2022   K 4.2 03/20/2022   CL 105 03/20/2022   CREATININE 1.15 03/20/2022   BUN 14 03/20/2022   CO2 31 03/20/2022   TSH 1.88 03/20/2022   PSA 0.00 (L) 03/20/2022   HGBA1C 5.7 03/07/2014    No results found.  Assessment & Plan:   Problem List Items Addressed This Visit     CAD (coronary artery disease)    No NSAIDs for ear pain      Ear pain, left - Primary     New sharp severe pain in the L ear that started w/an opening act loud music - [facility] 4 wks ago. Edwin Ramirez went to [facility] - took the abx. Sound wave injury. Start Medrol pac, Afrin      Bruising    Arnica gel prn         Meds ordered this encounter  Medications   dicyclomine (BENTYL) 10 MG capsule    Sig: TAKE 1 CAPSULE BY MOUTH 4 TIMES DAILY, BEFORE MEALS AND AT BEDTIME.    Dispense:  100 capsule    Refill:  0   methylPREDNISolone (MEDROL DOSEPAK) 4 MG TBPK tablet    Sig: As directed    Dispense:  21 tablet    Refill:  0      Follow-up: Return in about 4 weeks (around 12/11/2022) for a follow-up visit.  Edwin Primes, MD

## 2022-11-13 NOTE — Assessment & Plan Note (Signed)
No NSAIDs for ear pain

## 2022-11-13 NOTE — Assessment & Plan Note (Signed)
Arnica gel prn

## 2022-11-21 IMAGING — DX DG RIBS W/ CHEST 3+V*L*
3 series · 3 of 3 positions shown · non-contrast
Comparison: 08/01/2020

CLINICAL DATA: Fall on left side

EXAM:
LEFT RIBS AND CHEST - 3+ VIEW

[chest pa]
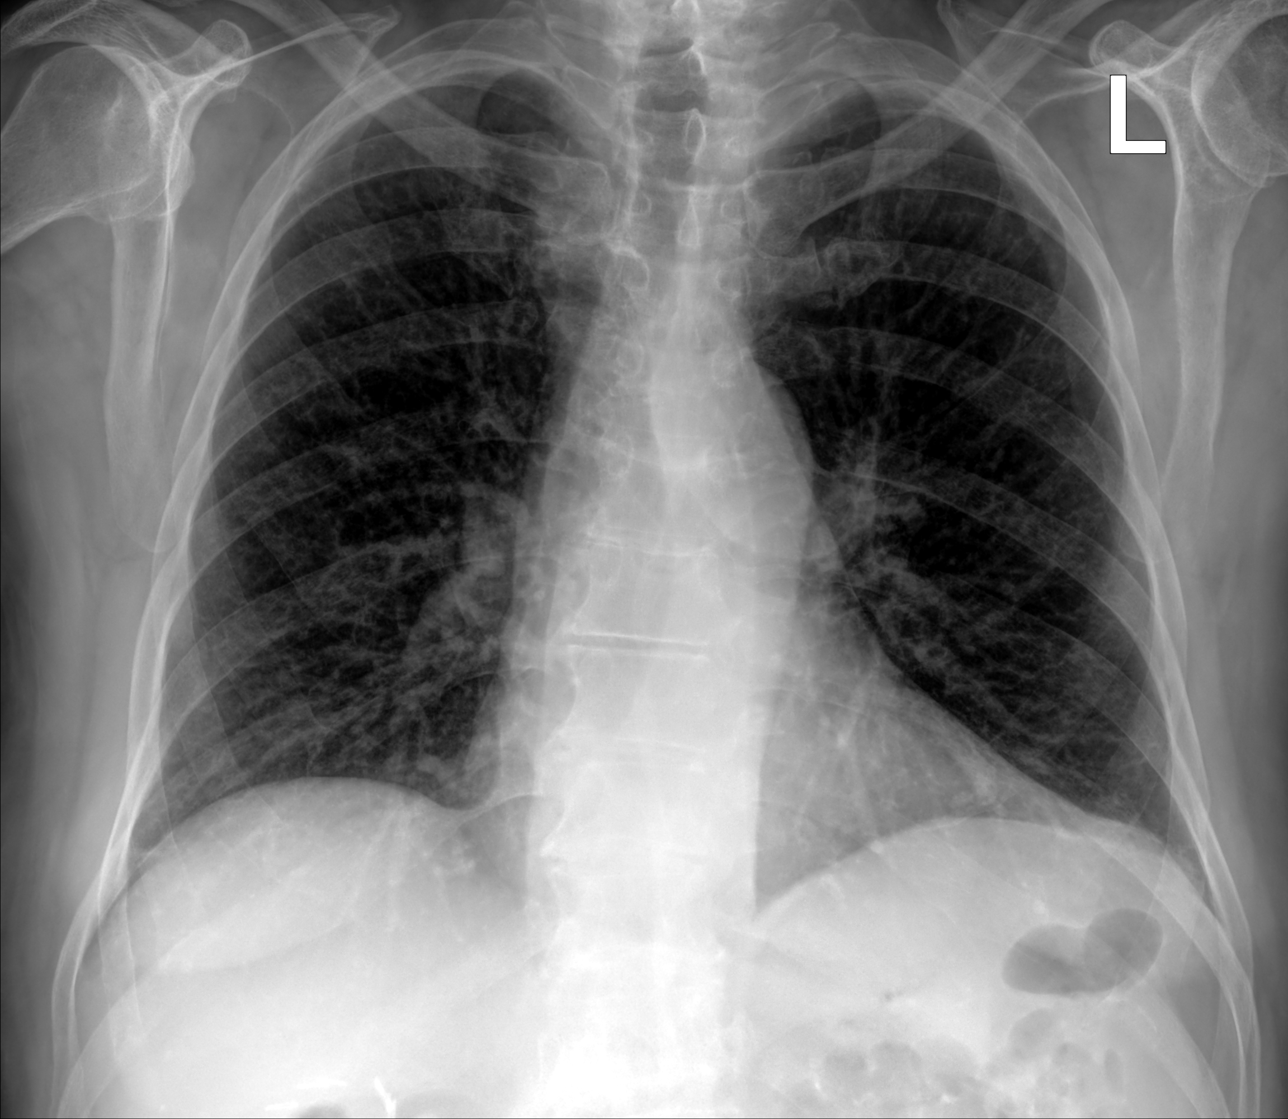

[hemithorax (ribs) pa (1 of 2)]
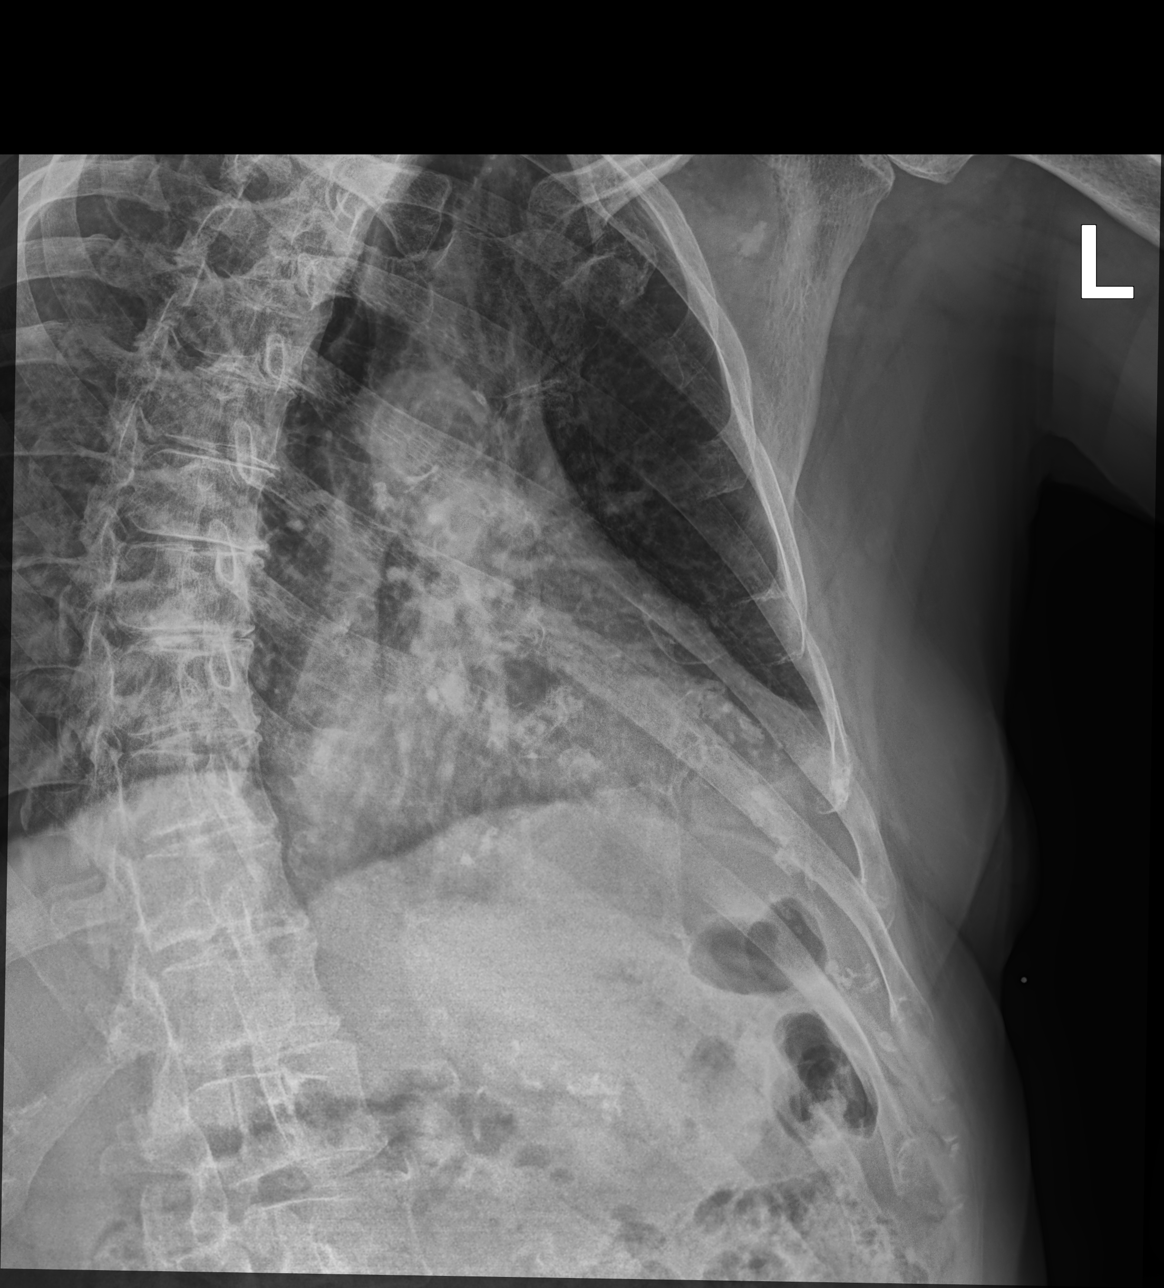

[hemithorax (ribs) pa (2 of 2)]
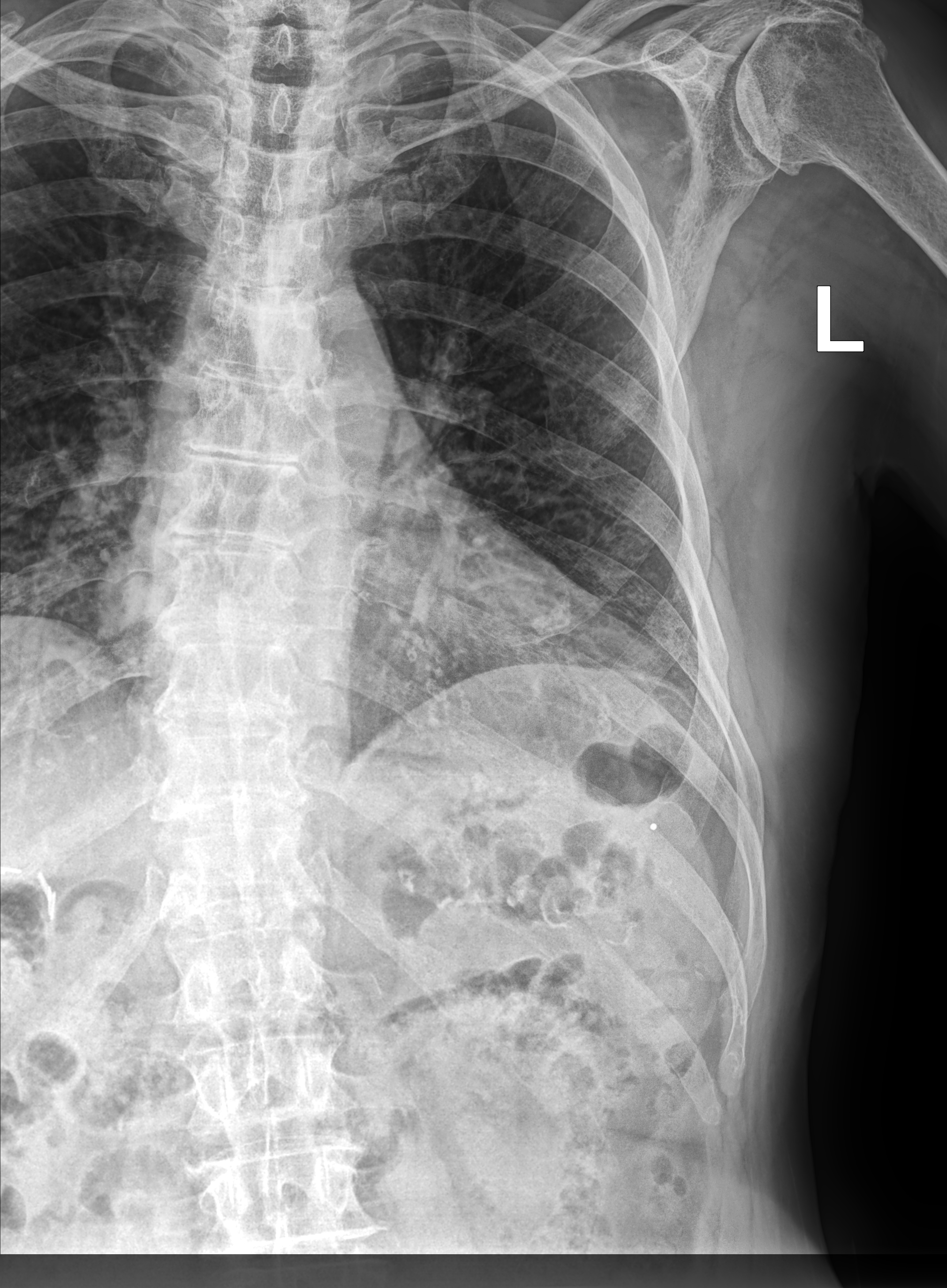

[3 of 3 positions shown; findings below may reference images not displayed]

FINDINGS: Single-view chest demonstrates no focal opacity or pleural effusion.
Normal cardiomediastinal silhouette. No visible pneumothorax. No
definite acute displaced rib fracture is seen. Possible age
indeterminate left seventh lateral rib fracture, favor chronic
fracture.
IMPRESSION: 1. No visible pneumothorax or pleural effusion.
2. No definite acute displaced rib fracture seen. Probable chronic
fracture deformity of left seventh rib

## 2022-12-10 DIAGNOSIS — H35373 Puckering of macula, bilateral: Secondary | ICD-10-CM | POA: Diagnosis not present

## 2022-12-10 DIAGNOSIS — H43813 Vitreous degeneration, bilateral: Secondary | ICD-10-CM | POA: Diagnosis not present

## 2022-12-10 DIAGNOSIS — H0102A Squamous blepharitis right eye, upper and lower eyelids: Secondary | ICD-10-CM | POA: Diagnosis not present

## 2022-12-10 DIAGNOSIS — H401131 Primary open-angle glaucoma, bilateral, mild stage: Secondary | ICD-10-CM | POA: Diagnosis not present

## 2022-12-11 ENCOUNTER — Encounter: Payer: Self-pay | Admitting: Internal Medicine

## 2022-12-11 ENCOUNTER — Ambulatory Visit (INDEPENDENT_AMBULATORY_CARE_PROVIDER_SITE_OTHER): Payer: Medicare Other | Admitting: Internal Medicine

## 2022-12-11 VITALS — BP 108/76 | HR 67 | Temp 97.9°F | Ht 67.0 in | Wt 156.0 lb

## 2022-12-11 DIAGNOSIS — T148XXA Other injury of unspecified body region, initial encounter: Secondary | ICD-10-CM

## 2022-12-11 DIAGNOSIS — I872 Venous insufficiency (chronic) (peripheral): Secondary | ICD-10-CM

## 2022-12-11 DIAGNOSIS — H9202 Otalgia, left ear: Secondary | ICD-10-CM | POA: Diagnosis not present

## 2022-12-11 NOTE — Patient Instructions (Signed)
Compr sock or sleeve Elevate leg

## 2022-12-11 NOTE — Progress Notes (Signed)
Subjective:  Patient ID: Edwin Ramirez, male    DOB: 06-23-1940  Age: 82 y.o. MRN: [id number]  CC: Follow-up (4 week f/u)   HPI Edwin Ramirez presents for LLE swelling No pain F/u on L ear pain - resolved, but he had a sudden relapse in the pool when the water got in - pain lasted x 18 hrs  Outpatient Medications Prior to Visit  Medication Sig Dispense Refill   b complex vitamins tablet Take 1 tablet by mouth daily. 100 tablet 3   brimonidine (ALPHAGAN) 0.2 % ophthalmic solution Place 1 drop into both eyes.     BRIMONIDINE TARTRATE OP Apply to eye.     Cholecalciferol (VITAMIN D3) 50 MCG (2000 UT) capsule Take 1 capsule (2,000 Units total) by mouth daily. 100 capsule 3   dicyclomine (BENTYL) 10 MG capsule TAKE 1 CAPSULE BY MOUTH 4 TIMES DAILY, BEFORE MEALS AND AT BEDTIME. 100 capsule 0   latanoprost (XALATAN) 0.005 % ophthalmic solution Place 1 drop into both eyes at bedtime.     methylPREDNISolone (MEDROL DOSEPAK) 4 MG TBPK tablet As directed 21 tablet 0   omeprazole (PRILOSEC) 40 MG capsule TAKE 1 CAPSULE BY MOUTH TWICE A DAY 180 capsule 2   rosuvastatin (CRESTOR) 10 MG tablet TAKE 1 TABLET BY MOUTH EVERY DAY 90 tablet 3   traMADol (ULTRAM) 50 MG tablet Take 1 tablet (50 mg total) by mouth every 6 (six) hours as needed for severe pain. 20 tablet 0   traZODone (DESYREL) 50 MG tablet TAKE 1 TO 2 TABLETS BY MOUTH AT BEDTIME AS NEEDED FOR SLEEP 180 tablet 2   No facility-administered medications prior to visit.    ROS: Review of Systems  Constitutional:  Negative for appetite change, fatigue and unexpected weight change.  HENT:  Negative for congestion, nosebleeds, sneezing, sore throat and trouble swallowing.   Eyes:  Negative for itching and visual disturbance.  Respiratory:  Negative for cough.   Cardiovascular:  Negative for chest pain, palpitations and leg swelling.  Gastrointestinal:  Negative for abdominal distention, blood in stool, diarrhea and nausea.   Genitourinary:  Negative for frequency and hematuria.  Musculoskeletal:  Negative for back pain, gait problem, joint swelling and neck pain.  Skin:  Negative for rash.  Neurological:  Negative for dizziness, tremors, speech difficulty and weakness.  Psychiatric/Behavioral:  Negative for agitation, dysphoric mood and sleep disturbance. The patient is not nervous/anxious.     Objective:  BP 108/76 (BP Location: Right Arm, Patient Position: Sitting, Cuff Size: Large)   Pulse 67   Temp 97.9 F (36.6 C) (Oral)   Ht 5\' 7"  (1.702 m)   Wt 156 lb (70.8 kg)   SpO2 97%   BMI 24.43 kg/m   BP Readings from Last 3 Encounters:  12/11/22 108/76  11/13/22 110/70  10/22/22 114/69    Wt Readings from Last 3 Encounters:  12/11/22 156 lb (70.8 kg)  11/13/22 153 lb (69.4 kg)  10/10/22 157 lb (71.2 kg)    Physical Exam Constitutional:      General: He is not in acute distress. Musculoskeletal:        General: Swelling present. No tenderness.     Right lower leg: No edema.     Left lower leg: Edema present.  Neurological:     Mental Status: He is oriented to person, place, and time.   LLE w/trace edema, hyperpigmented L ear canal and L TM - WNL  Lab Results  Component Value Date  WBC 4.7 03/20/2022   HGB 13.8 03/20/2022   HCT 41.4 03/20/2022   PLT 144.0 (L) 03/20/2022   GLUCOSE 93 03/20/2022   CHOL 120 03/20/2022   TRIG 106.0 03/20/2022   HDL 44.20 03/20/2022   LDLDIRECT 78.8 02/05/2011   LDLCALC 55 03/20/2022   ALT 19 03/20/2022   AST 21 03/20/2022   NA 142 03/20/2022   K 4.2 03/20/2022   CL 105 03/20/2022   CREATININE 1.15 03/20/2022   BUN 14 03/20/2022   CO2 31 03/20/2022   TSH 1.88 03/20/2022   PSA 0.00 (L) 03/20/2022   HGBA1C 5.7 03/07/2014    No results found.  Assessment & Plan:   Problem List Items Addressed This Visit     Ear pain, left - Primary     New sharp severe pain in the L ear that started w/an opening act loud music - [facility] 4 wks  ago. [name] went to [facility] - took the abx. Sound wave injury. L ear barotrauma w/a loud sound S/p  Medrol pac, Afrin L ear pain - resolved, but he had a sudden relapse in the pool when the water got in - pain lasted x 18 hrs ENT ref      Relevant Orders   Ambulatory referral to ENT   Bruising    Arnica gel prn      Chronic venous insufficiency    Compr sock or sleeve Elevate leg No signs of DVT         No orders of the defined types were placed in this encounter.     Follow-up: Return in about 3 months (around 03/13/2023) for a follow-up visit.  Sonda Primes, MD

## 2022-12-11 NOTE — Assessment & Plan Note (Signed)
Compr sock or sleeve Elevate leg No signs of DVT

## 2022-12-11 NOTE — Assessment & Plan Note (Signed)
Arnica gel prn 

## 2022-12-11 NOTE — Assessment & Plan Note (Addendum)
  New sharp severe pain in the L ear that started w/an opening act loud music - [facility] 4 wks ago. [name] went to [facility] - took the abx. Sound wave injury. L ear barotrauma w/a loud sound S/p  Medrol pac, Afrin L ear pain - resolved, but he had a sudden relapse in the pool when the water got in - pain lasted x 18 hrs ENT ref

## 2022-12-19 ENCOUNTER — Ambulatory Visit
Admission: RE | Admit: 2022-12-19 | Discharge: 2022-12-19 | Disposition: A | Discharge status: Home / Self Care | Payer: Medicare Other | Source: Ambulatory Visit | Attending: Family Medicine | Admitting: Family Medicine

## 2022-12-19 VITALS — BP 114/65 | HR 67 | Temp 97.8°F | Resp 18

## 2022-12-19 DIAGNOSIS — I872 Venous insufficiency (chronic) (peripheral): Secondary | ICD-10-CM

## 2022-12-19 DIAGNOSIS — R2242 Localized swelling, mass and lump, left lower limb: Secondary | ICD-10-CM

## 2022-12-19 NOTE — ED Provider Notes (Signed)
[facility]   [id number] 12/19/22 Arrival Time: 1016  ASSESSMENT & PLAN:  1. Localized swelling of left lower leg   2. Venous stasis dermatitis    No signs of skin infection/cellulitis; reassured. Suspect localized inflammation overlying chronic venous stasis changes/dermatitis. Discussed.  Will consider using compression stockings. Activities as tolerated.  Recommend:  Follow-up Information     Plotnikov, Georgina Quint, MD.   Specialty: Internal Medicine Why: As needed. Contact information: [address] [place] [address] [phone]                Reviewed expectations re: course of current medical issues. Questions answered. Outlined signs and symptoms indicating need for more acute intervention. Patient verbalized understanding. After Visit Summary given.  SUBJECTIVE: History from: patient. Edwin Ramirez is a 82 y.o. male who reports "redness" to LLE; with chronic swelling of LLE that improves every morning. Denies injury. Redness first noted approx 10 d ago; stable. Denies fever/pain. No extremity sensation changes or weakness. No tx PTA.   Past Surgical History:  Procedure Laterality Date   APPENDECTOMY     cataract surgery  2012   bilateral   CHOLECYSTECTOMY  1985   COLONOSCOPY W/ POLYPECTOMY  2004   Negative 2009 & 2013 Dr.Brodie   ESOPHAGEAL DILATION  2004   MOHS SURGERY     Basal Cell   PROSTATECTOMY  07/2005   Dr.Borden   ROTATOR CUFF REPAIR      X 2-Left; Dr Eulah Pont      OBJECTIVE:  Vitals:   12/19/22 1034  BP: 114/65  Pulse: 67  Resp: 18  Temp: 97.8 F (36.6 C)  TempSrc: Oral  SpO2: 93%    General appearance: alert; no distress HEENT: [place]; AT Neck: supple with FROM Resp: unlabored respirations Extremities: LLE: warm with well perfused appearance; with skin changes assoc with venous stasis; does have slightly erythematous area over medial lower leg; without warmth or tenderness; bruising: scattered; knee  and ankle ROM: normal CV: brisk extremity capillary refill of LLE; 1+ DP pulse of LLE. Skin: warm and dry; no visible rashes Neurologic: gait normal; normal sensation and strength of LLE Psychological: alert and cooperative; normal mood and affect   Allergies  Allergen Reactions   Codeine     REACTION: agitation, mental status changes with high doses of codeine post op Can take Tramadol OK, low dose Norco ok   Nabumetone     Mental status changes post op with Relafen   Pneumovax [Pneumococcal Polysaccharide Vaccine]     swelling   Prevnar 13 [Pneumococcal 13-Val Conj Vacc]     rash   Aspirin     gastritis    Past Medical History:  Diagnosis Date   Basal cell cancer    Dr Nicholas Lose   DDD (degenerative disc disease)    Diverticulosis    Esophageal stricture    Dr [name]   Gastritis    GERD (gastroesophageal reflux disease)    Sullivan Lone syndrome    Glaucoma    Hepatic cyst    History of prostate cancer    Dr Laverle Patter   Hx of adenomatous colonic polyps    Hyperlipidemia    has had good cholesterol count for years.   Internal hemorrhoids    Pulmonary nodule    not seen on F/U 2014   TMJ (dislocation of temporomandibular joint)    Social History   Socioeconomic History   Marital status: Married    Spouse name: Not on file  Number of children: 2   Years of education: Not on file   Highest education level: Not on file  Occupational History   Occupation: retired   Occupation: retired  Tobacco Use   Smoking status: Never   Smokeless tobacco: Never  Vaping Use   Vaping status: Never Used  Substance and Sexual Activity   Alcohol use: Yes    Alcohol/week: 5.0 standard drinks of alcohol    Types: 5 Glasses of wine per week    Comment: Socially   Drug use: No   Sexual activity: Not Currently  Other Topics Concern   Not on file  Social History Narrative   DAILY CAFFEINE   Social Determinants of Health   Financial Resource Strain: Low Risk  (04/05/2019)   Overall  Financial Resource Strain (CARDIA)    Difficulty of Paying Living Expenses: Not hard at all  Food Insecurity: No Food Insecurity (04/05/2019)   Hunger Vital Sign    Worried About Running Out of Food in the Last Year: Never true    Ran Out of Food in the Last Year: Never true  Transportation Needs: No Transportation Needs (04/05/2019)   PRAPARE - Administrator, Civil Service (Medical): No    Lack of Transportation (Non-Medical): No  Physical Activity: Sufficiently Active (04/05/2019)   Exercise Vital Sign    Days of Exercise per Week: 5 days    Minutes of Exercise per Session: 60 min  Stress: No Stress Concern Present (04/05/2019)   [employer] of Occupational Health - Occupational Stress Questionnaire    Feeling of Stress : Not at all  Social Connections: Socially Integrated (04/05/2019)   Social Connection and Isolation Panel [NHANES]    Frequency of Communication with Friends and Family: More than three times a week    Frequency of Social Gatherings with Friends and Family: More than three times a week    Attends Religious Services: More than 4 times per year    Active Member of Clubs or Organizations: Yes    Attends Engineer, structural: More than 4 times per year    Marital Status: Married   Family History  Problem Relation Age of Onset   Multiple myeloma Father    Colon cancer Mother 36   Heart failure Mother    Heart attack Maternal Uncle 23   Diabetes Neg Hx    Stroke Neg Hx    Esophageal cancer Neg Hx    Rectal cancer Neg Hx    Stomach cancer Neg Hx    Past Surgical History:  Procedure Laterality Date   APPENDECTOMY     cataract surgery  2012   bilateral   CHOLECYSTECTOMY  1985   COLONOSCOPY W/ POLYPECTOMY  2004   Negative 2009 & 2013 Dr.Brodie   ESOPHAGEAL DILATION  2004   MOHS SURGERY     Basal Cell   PROSTATECTOMY  07/2005   Dr.Borden   ROTATOR CUFF REPAIR      X 2-Left; Dr Adair Laundry, MD 12/19/22 1239

## 2022-12-19 NOTE — ED Triage Notes (Signed)
Pt presents with redness and swelling to left ankle that started 10 days ago. No recent injury or fall.

## 2022-12-24 ENCOUNTER — Telehealth: Payer: Self-pay | Admitting: Radiology

## 2022-12-24 NOTE — Telephone Encounter (Signed)
Contacted [name] to schedule their annual wellness visit. Call back at later date: 12/30/22 .   Jonalyn Sedlak K. CMA

## 2023-01-01 DIAGNOSIS — K080 Exfoliation of teeth due to systemic causes: Secondary | ICD-10-CM | POA: Diagnosis not present

## 2023-01-02 DIAGNOSIS — L820 Inflamed seborrheic keratosis: Secondary | ICD-10-CM | POA: Diagnosis not present

## 2023-01-02 DIAGNOSIS — L570 Actinic keratosis: Secondary | ICD-10-CM | POA: Diagnosis not present

## 2023-01-02 DIAGNOSIS — C44229 Squamous cell carcinoma of skin of left ear and external auricular canal: Secondary | ICD-10-CM | POA: Diagnosis not present

## 2023-01-02 DIAGNOSIS — Z85828 Personal history of other malignant neoplasm of skin: Secondary | ICD-10-CM | POA: Diagnosis not present

## 2023-01-21 DIAGNOSIS — C44229 Squamous cell carcinoma of skin of left ear and external auricular canal: Secondary | ICD-10-CM | POA: Diagnosis not present

## 2023-01-29 ENCOUNTER — Other Ambulatory Visit: Payer: Self-pay | Admitting: Internal Medicine

## 2023-02-11 ENCOUNTER — Other Ambulatory Visit: Payer: Self-pay | Admitting: Internal Medicine

## 2023-03-13 ENCOUNTER — Other Ambulatory Visit: Payer: Self-pay | Admitting: Family Medicine

## 2023-03-24 ENCOUNTER — Ambulatory Visit: Payer: Medicare Other | Admitting: Internal Medicine

## 2023-03-24 ENCOUNTER — Ambulatory Visit (INDEPENDENT_AMBULATORY_CARE_PROVIDER_SITE_OTHER): Payer: Medicare Other

## 2023-03-24 ENCOUNTER — Encounter: Payer: Self-pay | Admitting: Internal Medicine

## 2023-03-24 VITALS — BP 110/60 | HR 56 | Temp 98.0°F | Ht 67.0 in | Wt 155.0 lb

## 2023-03-24 DIAGNOSIS — M5441 Lumbago with sciatica, right side: Secondary | ICD-10-CM | POA: Diagnosis not present

## 2023-03-24 DIAGNOSIS — E785 Hyperlipidemia, unspecified: Secondary | ICD-10-CM

## 2023-03-24 DIAGNOSIS — G8929 Other chronic pain: Secondary | ICD-10-CM

## 2023-03-24 DIAGNOSIS — Z0000 Encounter for general adult medical examination without abnormal findings: Secondary | ICD-10-CM

## 2023-03-24 DIAGNOSIS — M47816 Spondylosis without myelopathy or radiculopathy, lumbar region: Secondary | ICD-10-CM | POA: Diagnosis not present

## 2023-03-24 DIAGNOSIS — Z8546 Personal history of malignant neoplasm of prostate: Secondary | ICD-10-CM | POA: Diagnosis not present

## 2023-03-24 DIAGNOSIS — Z23 Encounter for immunization: Secondary | ICD-10-CM

## 2023-03-24 DIAGNOSIS — M5126 Other intervertebral disc displacement, lumbar region: Secondary | ICD-10-CM | POA: Diagnosis not present

## 2023-03-24 DIAGNOSIS — M51369 Other intervertebral disc degeneration, lumbar region without mention of lumbar back pain or lower extremity pain: Secondary | ICD-10-CM | POA: Diagnosis not present

## 2023-03-24 DIAGNOSIS — Z0001 Encounter for general adult medical examination with abnormal findings: Secondary | ICD-10-CM | POA: Diagnosis not present

## 2023-03-24 DIAGNOSIS — M48061 Spinal stenosis, lumbar region without neurogenic claudication: Secondary | ICD-10-CM | POA: Diagnosis not present

## 2023-03-24 LAB — TSH: TSH: 2.36 u[IU]/mL (ref 0.35–5.50)

## 2023-03-24 LAB — LIPID PANEL
Cholesterol: 128 mg/dL (ref 0–200)
HDL: 42.8 mg/dL (ref >39.00)
LDL Cholesterol: 62 mg/dL (ref 0–99)
NonHDL: 84.98
Total CHOL/HDL Ratio: 3
Triglycerides: 114 mg/dL (ref 0.0–149.0)
VLDL: 22.8 mg/dL (ref 0.0–40.0)

## 2023-03-24 LAB — URINALYSIS
Bilirubin Urine: NEGATIVE
Hgb urine dipstick: NEGATIVE
Ketones, ur: NEGATIVE
Leukocytes,Ua: NEGATIVE
Nitrite: NEGATIVE
Specific Gravity, Urine: 1.025 (ref 1.000–1.030)
Total Protein, Urine: NEGATIVE
Urine Glucose: NEGATIVE
Urobilinogen, UA: 0.2 (ref 0.0–1.0)
pH: 6 (ref 5.0–8.0)

## 2023-03-24 LAB — COMPREHENSIVE METABOLIC PANEL
ALT: 17 U/L (ref 0–53)
AST: 21 U/L (ref 0–37)
Albumin: 4.2 g/dL (ref 3.5–5.2)
Alkaline Phosphatase: 55 U/L (ref 39–117)
BUN: 17 mg/dL (ref 6–23)
CO2: 29 meq/L (ref 19–32)
Calcium: 9.2 mg/dL (ref 8.4–10.5)
Chloride: 106 meq/L (ref 96–112)
Creatinine, Ser: 1.09 mg/dL (ref 0.40–1.50)
GFR: 62.36 mL/min (ref >60.00)
Glucose, Bld: 91 mg/dL (ref 70–99)
Potassium: 4.1 meq/L (ref 3.5–5.1)
Sodium: 140 meq/L (ref 135–145)
Total Bilirubin: 0.8 mg/dL (ref 0.2–1.2)
Total Protein: 6.4 g/dL (ref 6.0–8.3)

## 2023-03-24 LAB — CBC WITH DIFFERENTIAL/PLATELET
Basophils Absolute: 0 10*3/uL (ref 0.0–0.1)
Basophils Relative: 0.8 % (ref 0.0–3.0)
Eosinophils Absolute: 0.1 10*3/uL (ref 0.0–0.7)
Eosinophils Relative: 3 % (ref 0.0–5.0)
HCT: 40.6 % (ref 39.0–52.0)
Hemoglobin: 13.2 g/dL (ref 13.0–17.0)
Lymphocytes Relative: 24.5 % (ref 12.0–46.0)
Lymphs Abs: 1.2 10*3/uL (ref 0.7–4.0)
MCHC: 32.4 g/dL (ref 30.0–36.0)
MCV: 100.2 fL — ABNORMAL HIGH (ref 78.0–100.0)
Monocytes Absolute: 0.5 10*3/uL (ref 0.1–1.0)
Monocytes Relative: 10.8 % (ref 3.0–12.0)
Neutro Abs: 2.9 10*3/uL (ref 1.4–7.7)
Neutrophils Relative %: 60.9 % (ref 43.0–77.0)
Platelets: 149 10*3/uL — ABNORMAL LOW (ref 150.0–400.0)
RBC: 4.06 Mil/uL — ABNORMAL LOW (ref 4.22–5.81)
RDW: 12.7 % (ref 11.5–15.5)
WBC: 4.7 10*3/uL (ref 4.0–10.5)

## 2023-03-24 LAB — PSA: PSA: 0 ng/mL — ABNORMAL LOW (ref 0.10–4.00)

## 2023-03-24 MED ORDER — METHOCARBAMOL 500 MG PO TABS: 500.0000 mg | ORAL_TABLET | Freq: Three times a day (TID) | ORAL | 1 refills | Status: AC | PRN

## 2023-03-24 NOTE — Assessment & Plan Note (Signed)

## 2023-03-24 NOTE — Assessment & Plan Note (Signed)
Pt wants to try a muscle relaxer R sciatica, recurrent

## 2023-03-24 NOTE — Progress Notes (Signed)
Subjective:  Patient ID: Edwin Ramirez, male    DOB: 1941/01/22  Age: 82 y.o. MRN: [id number]  CC: No chief complaint on file.   HPI Edwin Ramirez presents for a well exam C/o LBP - chronic  Outpatient Medications Prior to Visit  Medication Sig Dispense Refill   b complex vitamins tablet Take 1 tablet by mouth daily. 100 tablet 3   brimonidine (ALPHAGAN) 0.2 % ophthalmic solution Place 1 drop into both eyes.     BRIMONIDINE TARTRATE OP Apply to eye.     Cholecalciferol (VITAMIN D3) 50 MCG (2000 UT) capsule Take 1 capsule (2,000 Units total) by mouth daily. 100 capsule 3   dicyclomine (BENTYL) 10 MG capsule TAKE 1 CAPSULE BY MOUTH 4 TIMES DAILY, BEFORE MEALS AND AT BEDTIME. 100 capsule 0   latanoprost (XALATAN) 0.005 % ophthalmic solution Place 1 drop into both eyes at bedtime.     methylPREDNISolone (MEDROL DOSEPAK) 4 MG TBPK tablet As directed 21 tablet 0   omeprazole (PRILOSEC) 40 MG capsule TAKE 1 CAPSULE BY MOUTH TWICE A DAY 180 capsule 2   rosuvastatin (CRESTOR) 10 MG tablet TAKE 1 TABLET BY MOUTH EVERY DAY 90 tablet 3   traMADol (ULTRAM) 50 MG tablet Take 1 tablet (50 mg total) by mouth every 6 (six) hours as needed for severe pain. 20 tablet 0   traZODone (DESYREL) 50 MG tablet TAKE 1 TO 2 TABLETS BY MOUTH AT BEDTIME AS NEEDED FOR SLEEP 60 tablet 0   No facility-administered medications prior to visit.    ROS: Review of Systems  Constitutional:  Negative for appetite change, fatigue and unexpected weight change.  HENT:  Negative for congestion, nosebleeds, sneezing, sore throat and trouble swallowing.   Eyes:  Negative for itching and visual disturbance.  Respiratory:  Negative for cough.   Cardiovascular:  Negative for chest pain, palpitations and leg swelling.  Gastrointestinal:  Negative for abdominal distention, blood in stool, diarrhea and nausea.  Genitourinary:  Negative for frequency and hematuria.  Musculoskeletal:  Negative for back pain, gait  problem, joint swelling and neck pain.  Skin:  Negative for rash.  Neurological:  Negative for dizziness, tremors, speech difficulty and weakness.  Psychiatric/Behavioral:  Negative for agitation, dysphoric mood and sleep disturbance. The patient is not nervous/anxious.     Objective:  BP 110/60 (BP Location: Right Arm, Patient Position: Sitting, Cuff Size: Normal)   Pulse (!) 56   Temp 98 F (36.7 C) (Oral)   Ht 5\' 7"  (1.702 m)   Wt 155 lb (70.3 kg)   SpO2 97%   BMI 24.28 kg/m   BP Readings from Last 3 Encounters:  03/24/23 110/60  12/19/22 114/65  12/11/22 108/76    Wt Readings from Last 3 Encounters:  03/24/23 155 lb (70.3 kg)  12/11/22 156 lb (70.8 kg)  11/13/22 153 lb (69.4 kg)    Physical Exam Constitutional:      General: He is not in acute distress.    Appearance: He is well-developed.     Comments: NAD  Eyes:     Conjunctiva/sclera: Conjunctivae normal.     Pupils: Pupils are equal, round, and reactive to light.  Neck:     Thyroid: No thyromegaly.     Vascular: No JVD.  Cardiovascular:     Rate and Rhythm: Normal rate and regular rhythm.     Heart sounds: Normal heart sounds. No murmur heard.    No friction rub. No gallop.  Pulmonary:  Effort: Pulmonary effort is normal. No respiratory distress.     Breath sounds: Normal breath sounds. No wheezing or rales.  Chest:     Chest wall: No tenderness.  Abdominal:     General: Bowel sounds are normal. There is no distension.     Palpations: Abdomen is soft. There is no mass.     Tenderness: There is no abdominal tenderness. There is no guarding or rebound.  Musculoskeletal:        General: No tenderness. Normal range of motion.     Cervical back: Normal range of motion.  Lymphadenopathy:     Cervical: No cervical adenopathy.  Skin:    General: Skin is warm and dry.     Findings: No rash.  Neurological:     Mental Status: He is alert and oriented to person, place, and time.     Cranial Nerves: No  cranial nerve deficit.     Motor: No abnormal muscle tone.     Coordination: Coordination normal.     Gait: Gait normal.     Deep Tendon Reflexes: Reflexes are normal and symmetric.  Psychiatric:        Behavior: Behavior normal.        Thought Content: Thought content normal.        Judgment: Judgment normal.   LS w/pain on ROM  Lab Results  Component Value Date   WBC 4.7 03/20/2022   HGB 13.8 03/20/2022   HCT 41.4 03/20/2022   PLT 144.0 (L) 03/20/2022   GLUCOSE 93 03/20/2022   CHOL 120 03/20/2022   TRIG 106.0 03/20/2022   HDL 44.20 03/20/2022   LDLDIRECT 78.8 02/05/2011   LDLCALC 55 03/20/2022   ALT 19 03/20/2022   AST 21 03/20/2022   NA 142 03/20/2022   K 4.2 03/20/2022   CL 105 03/20/2022   CREATININE 1.15 03/20/2022   BUN 14 03/20/2022   CO2 31 03/20/2022   TSH 1.88 03/20/2022   PSA 0.00 (L) 03/20/2022   HGBA1C 5.7 03/07/2014    No results found.  Assessment & Plan:   Problem List Items Addressed This Visit     Dyslipidemia   Relevant Orders   Lipid panel   PROSTATE CANCER, HX OF   Relevant Orders   PSA   RESOLVED: Well adult exam    We discussed age appropriate health related issues, including available/recomended screening tests and vaccinations. Labs were ordered to be later reviewed . All questions were answered. We discussed one or more of the following - seat belt use, use of sunscreen/sun exposure exercise, fall risk reduction, second hand smoke exposure, firearm use and storage, seat belt use, a need for adhering to healthy diet and exercise. Labs were ordered.  All questions were answered.  Cologuard due 2024      Relevant Orders   TSH   Urinalysis   CBC with Differential/Platelet   Lipid panel   PSA   Comprehensive metabolic panel   Low back pain    Pt wants to try a muscle relaxer R sciatica, recurrent      Relevant Medications   methocarbamol (ROBAXIN) 500 MG tablet   Other Relevant Orders   DG Lumbar Spine 2-3 Views   Other  Visit Diagnoses     Need for influenza vaccination    -  Primary   Relevant Orders   Flu Vaccine Trivalent High Dose (Fluad) (Completed)         Meds ordered this encounter  Medications   methocarbamol (  ROBAXIN) 500 MG tablet    Sig: Take 1 tablet (500 mg total) by mouth every 8 (eight) hours as needed for muscle spasms.    Dispense:  30 tablet    Refill:  1      Follow-up: Return in about 6 months (around 09/22/2023) for a follow-up visit.  Sonda Primes, MD

## 2023-03-27 ENCOUNTER — Ambulatory Visit (INDEPENDENT_AMBULATORY_CARE_PROVIDER_SITE_OTHER): Payer: Medicare Other

## 2023-03-27 VITALS — Ht 67.0 in | Wt 155.0 lb

## 2023-03-27 DIAGNOSIS — Z0000 Encounter for general adult medical examination without abnormal findings: Secondary | ICD-10-CM | POA: Diagnosis not present

## 2023-03-27 NOTE — Progress Notes (Cosign Needed Addendum)
Subjective:   Edwin Ramirez is a 82 y.o. male who presents for Medicare Annual/Subsequent preventive examination.  Visit Complete: Virtual I connected with  [name] on 03/27/23 by a audio enabled telemedicine application and verified that I am speaking with the correct person using two identifiers.  Patient Location: Home  Provider Location: Office/Clinic  I discussed the limitations of evaluation and management by telemedicine. The patient expressed understanding and agreed to proceed.  Vital Signs: Because this visit was a virtual/telehealth visit, some criteria may be missing or patient reported. Any vitals not documented were not able to be obtained and vitals that have been documented are patient reported.  Cardiac Risk Factors include: advanced age (>30men, >81 women)     Objective:    Today's Vitals   03/27/23 1336 03/27/23 1338  Weight: 155 lb (70.3 kg)   Height: 5\' 7"  (1.702 m)   PainSc: 2  2   PainLoc: Back    Body mass index is 24.28 kg/m.     03/27/2023    1:39 PM 04/05/2019   12:05 PM 01/06/2016   12:22 PM  Advanced Directives  Does Patient Have a Medical Advance Directive? Yes Yes No  Type of Estate agent of [place];Living will Healthcare Power of [place];Living will   Copy of Healthcare Power of Attorney in Chart? No - copy requested No - copy requested   Would patient like information on creating a medical advance directive?   No - patient declined information    Current Medications (verified) Outpatient Encounter Medications as of 03/27/2023  Medication Sig   b complex vitamins tablet Take 1 tablet by mouth daily.   brimonidine (ALPHAGAN) 0.2 % ophthalmic solution Place 1 drop into both eyes.   BRIMONIDINE TARTRATE OP Apply to eye.   Cholecalciferol (VITAMIN D3) 50 MCG (2000 UT) capsule Take 1 capsule (2,000 Units total) by mouth daily.   dicyclomine (BENTYL) 10 MG capsule TAKE 1 CAPSULE BY MOUTH 4 TIMES  DAILY, BEFORE MEALS AND AT BEDTIME.   latanoprost (XALATAN) 0.005 % ophthalmic solution Place 1 drop into both eyes at bedtime.   methocarbamol (ROBAXIN) 500 MG tablet Take 1 tablet (500 mg total) by mouth every 8 (eight) hours as needed for muscle spasms.   methylPREDNISolone (MEDROL DOSEPAK) 4 MG TBPK tablet As directed   omeprazole (PRILOSEC) 40 MG capsule TAKE 1 CAPSULE BY MOUTH TWICE A DAY   rosuvastatin (CRESTOR) 10 MG tablet TAKE 1 TABLET BY MOUTH EVERY DAY   traMADol (ULTRAM) 50 MG tablet Take 1 tablet (50 mg total) by mouth every 6 (six) hours as needed for severe pain.   traZODone (DESYREL) 50 MG tablet TAKE 1 TO 2 TABLETS BY MOUTH AT BEDTIME AS NEEDED FOR SLEEP   No facility-administered encounter medications on file as of 03/27/2023.    Allergies (verified) Codeine, Nabumetone, Pneumovax [pneumococcal polysaccharide vaccine], Prevnar 13 [pneumococcal 13-val conj vacc], and Aspirin   History: Past Medical History:  Diagnosis Date   Basal cell cancer    Dr Nicholas Lose   DDD (degenerative disc disease)    Diverticulosis    Esophageal stricture    Dr [name]   Gastritis    GERD (gastroesophageal reflux disease)    Sullivan Lone syndrome    Glaucoma    Hepatic cyst    History of prostate cancer    Dr Laverle Patter   Hx of adenomatous colonic polyps    Hyperlipidemia    has had good cholesterol count for years.  Internal hemorrhoids    Pulmonary nodule    not seen on F/U 2014   TMJ (dislocation of temporomandibular joint)    Past Surgical History:  Procedure Laterality Date   APPENDECTOMY     cataract surgery  2012   bilateral   CHOLECYSTECTOMY  1985   COLONOSCOPY W/ POLYPECTOMY  2004   Negative 2009 & 2013 Dr.Brodie   ESOPHAGEAL DILATION  2004   MOHS SURGERY     Basal Cell   PROSTATECTOMY  07/2005   Dr.Borden   ROTATOR CUFF REPAIR      X 2-Left; Dr Eulah Pont   Family History  Problem Relation Age of Onset   Multiple myeloma Father    Colon cancer Mother 37   Heart failure  Mother    Heart attack Maternal Uncle 38   Diabetes Neg Hx    Stroke Neg Hx    Esophageal cancer Neg Hx    Rectal cancer Neg Hx    Stomach cancer Neg Hx    Social History   Socioeconomic History   Marital status: Married    Spouse name: Not on file   Number of children: 2   Years of education: Not on file   Highest education level: Not on file  Occupational History   Occupation: retired   Occupation: retired  Tobacco Use   Smoking status: Never   Smokeless tobacco: Never  Vaping Use   Vaping status: Never Used  Substance and Sexual Activity   Alcohol use: Yes    Alcohol/week: 5.0 standard drinks of alcohol    Types: 5 Glasses of wine per week    Comment: Socially   Drug use: No   Sexual activity: Not Currently  Other Topics Concern   Not on file  Social History Narrative   DAILY CAFFEINE   Social Determinants of Health   Financial Resource Strain: Low Risk  (03/27/2023)   Overall Financial Resource Strain (CARDIA)    Difficulty of Paying Living Expenses: Not hard at all  Food Insecurity: No Food Insecurity (03/27/2023)   Hunger Vital Sign    Worried About Running Out of Food in the Last Year: Never true    Ran Out of Food in the Last Year: Never true  Transportation Needs: No Transportation Needs (03/27/2023)   PRAPARE - Administrator, Civil Service (Medical): No    Lack of Transportation (Non-Medical): No  Physical Activity: Sufficiently Active (03/27/2023)   Exercise Vital Sign    Days of Exercise per Week: 5 days    Minutes of Exercise per Session: 60 min  Stress: No Stress Concern Present (03/27/2023)   [employer] of Occupational Health - Occupational Stress Questionnaire    Feeling of Stress : Not at all  Social Connections: Socially Integrated (03/27/2023)   Social Connection and Isolation Panel [NHANES]    Frequency of Communication with Friends and Family: More than three times a week    Frequency of Social Gatherings with Friends  and Family: More than three times a week    Attends Religious Services: More than 4 times per year    Active Member of [employer] or Organizations: Yes    Attends Engineer, structural: More than 4 times per year    Marital Status: Married    Tobacco Counseling Counseling given: Not Answered   Clinical Intake:  Pre-visit preparation completed: Yes  Pain : 0-10 Pain Score: 2  Pain Type: Neuropathic pain Pain Location: Back Pain Orientation: Lower Pain Radiating Towards:  LEGS     BMI - recorded: 24.28 Nutritional Status: BMI of 19-24  Normal Nutritional Risks: None Diabetes: No  How often do you need to have someone help you when you read instructions, pamphlets, or other written materials from your doctor or pharmacy?: 1 - Never What is the last grade level you completed in school?: [employer]  Interpreter Needed?: No  Information entered by :: Susie Cassette, LPN.   Activities of Daily Living    03/27/2023    1:41 PM  In your present state of health, do you have any difficulty performing the following activities:  Hearing? 0  Vision? 0  Difficulty concentrating or making decisions? 0  Walking or climbing stairs? 0  Dressing or bathing? 0  Doing errands, shopping? 0  Preparing Food and eating ? N  Using the Toilet? N  In the past six months, have you accidently leaked urine? N  Do you have problems with loss of bowel control? N  Managing your Medications? N  Managing your Finances? N  Housekeeping or managing your Housekeeping? N    Patient Care Team: Plotnikov, Georgina Quint, MD as PCP - General (Internal Medicine) Heloise Purpura, MD as Consulting Physician (Urology) Myrtie Neither Andreas Blower, MD as Consulting Physician (Gastroenterology)  Indicate any recent Medical Services you may have received from other than [facility] providers in the past year (date may be approximate).     Assessment:   This is a routine wellness examination for  [place].  Hearing/Vision screen Hearing Screening - Comments:: Patient has hearing difficulty and wears hearing aids. Vision Screening - Comments:: Patient does not wear any corrective lenses/contacts.   Eye exam done by:      Goals Addressed             This Visit's Progress    Patient Stated       TO STAY ALIVE AND KEEP MOVING.      Depression Screen    03/27/2023    1:40 PM 03/24/2023    9:15 AM 11/13/2022    7:58 AM 09/23/2022    9:36 AM 08/06/2022   10:28 AM 03/20/2022    9:47 AM 10/04/2021    3:07 PM  PHQ 2/9 Scores  PHQ - 2 Score 0 0 0 0 0 0 0  PHQ- 9 Score 0     0 0    Fall Risk    03/27/2023    1:40 PM 03/24/2023    9:15 AM 11/13/2022    7:58 AM 09/23/2022    9:35 AM 08/06/2022   10:28 AM  Fall Risk   Falls in the past year? 0 0 0 0 0  Number falls in past yr: 0 0 0 0 0  Injury with Fall? 0 0 0 0 0  Risk for fall due to : No Fall Risks No Fall Risks No Fall Risks No Fall Risks No Fall Risks  Follow up Falls prevention discussed Falls evaluation completed Falls evaluation completed Falls evaluation completed Falls evaluation completed    MEDICARE RISK AT HOME: Medicare Risk at Home Any stairs in or around the home?: Yes (Bonus Room) If so, are there any without handrails?: No Home free of loose throw rugs in walkways, pet beds, electrical cords, etc?: Yes Adequate lighting in your home to reduce risk of falls?: Yes Life alert?: No Use of a cane, walker or w/c?: No Grab bars in the bathroom?: No Shower chair or bench in shower?: Yes (1 shower) Elevated  toilet seat or a handicapped toilet?: Yes  TIMED UP AND GO:  Was the test performed?  No    Cognitive Function: Normal cognitive status assessed by direct observation via telephone conversation by this Nurse Health Advisor. No abnormalities found.        03/27/2023    1:42 PM 04/05/2019   12:07 PM  6CIT Screen  What Year? 0 points 0 points  What month? 0 points 0 points  What time? 0 points 0  points  Count back from 20 0 points 0 points  Months in reverse 0 points 0 points  Repeat phrase 0 points 0 points  Total Score 0 points 0 points    Immunizations Immunization History  Administered Date(s) Administered   Fluad Quad(high Dose 65+) 03/15/2019, 03/19/2021, 03/20/2022   Fluad Trivalent(High Dose 65+) 03/24/2023   Influenza Split 03/28/2011, 03/05/2012   Influenza Whole 03/16/2008, 04/06/2009, 02/08/2010   Influenza, High Dose Seasonal PF 03/25/2013, 03/18/2017, 03/11/2018, 04/10/2020   Influenza,inj,Quad PF,6+ Mos 03/17/2014, 03/01/2015, 03/01/2016   PFIZER Comirnaty(Gray Top)Covid-19 Tri-Sucrose Vaccine 10/10/2020   PFIZER(Purple Top)SARS-COV-2 Vaccination 06/30/2019, 07/21/2019, 03/14/2020   Pfizer Covid-19 Vaccine Bivalent Booster 34yrs & up 06/14/2021   Pneumococcal Conjugate-13 02/25/2014   Pneumococcal Polysaccharide-23 01/19/2010, 03/01/2016   Td 12/08/2006   Tdap 03/11/2018   Zoster Recombinant(Shingrix) 10/12/2017, 12/30/2017   Zoster, Live 04/21/2007    TDAP status: Up to date  Flu Vaccine status: Up to date  Pneumococcal vaccine status: Up to date  Covid-19 vaccine status: Completed vaccines  Qualifies for Shingles Vaccine? Yes   Zostavax completed Yes   Shingrix Completed?: Yes  Screening Tests Health Maintenance  Topic Date Due   COVID-19 Vaccine (6 - 2023-24 season) 02/09/2023   Medicare Annual Wellness (AWV)  03/26/2024   DTaP/Tdap/Td (3 - Td or Tdap) 03/11/2028   Pneumonia Vaccine 27+ Years old  Completed   INFLUENZA VACCINE  Completed   Zoster Vaccines- Shingrix  Completed   HPV VACCINES  Aged Out    Health Maintenance  Health Maintenance Due  Topic Date Due   COVID-19 Vaccine (6 - 2023-24 season) 02/09/2023    Colorectal cancer screening: No longer required.   Lung Cancer Screening: (Low Dose CT Chest recommended if Age 64-80 years, 20 pack-year currently smoking OR have quit w/in 15years.) does not qualify.   Lung Cancer  Screening Referral: NO  Additional Screening:  Hepatitis C Screening: does not qualify; Completed: NO  Vision Screening: Recommended annual ophthalmology exams for early detection of glaucoma and other disorders of the eye. Is the patient up to date with their annual eye exam?  Yes  Who is the provider or what is the name of the office in which the patient attends annual eye exams? [facility] If pt is not established with a provider, would they like to be referred to a provider to establish care? No .   Dental Screening: Recommended annual dental exams for proper oral hygiene  Diabetic Foot Exam: N/A  Community Resource Referral / Chronic Care Management: CRR required this visit?  No   CCM required this visit?  No     Plan:     I have personally reviewed and noted the following in the patient's chart:   Medical and social history Use of alcohol, tobacco or illicit drugs  Current medications and supplements including opioid prescriptions. Patient is not currently taking opioid prescriptions. Functional ability and status Nutritional status Physical activity Advanced directives List of other physicians Hospitalizations, surgeries, and ER visits  in previous 12 months Vitals Screenings to include cognitive, depression, and falls Referrals and appointments  In addition, I have reviewed and discussed with patient certain preventive protocols, quality metrics, and best practice recommendations. A written personalized care plan for preventive services as well as general preventive health recommendations were provided to patient.     Mickeal Needy, LPN   [id number]   After Visit Summary: (MyChart) Due to this being a telephonic visit, the after visit summary with patients personalized plan was offered to patient via MyChart   Nurse Notes: N/A  Medical screening examination/treatment/procedure(s) were performed by non-physician practitioner and as supervising  physician I was immediately available for consultation/collaboration.  I agree with above. Jacinta Shoe, MD

## 2023-03-27 NOTE — Patient Instructions (Addendum)
Mr. Edwin Ramirez , Thank you for taking time to come for your Medicare Wellness Visit. I appreciate your ongoing commitment to your health goals. Please review the following plan we discussed and let me know if I can assist you in the future.   Referrals/Orders/Follow-Ups/Clinician Recommendations: No  This is a list of the screening recommended for you and due dates:  Health Maintenance  Topic Date Due   COVID-19 Vaccine (6 - 2023-24 season) 02/09/2023   Medicare Annual Wellness Visit  03/26/2024   DTaP/Tdap/Td vaccine (3 - Td or Tdap) 03/11/2028   Pneumonia Vaccine  Completed   Flu Shot  Completed   Zoster (Shingles) Vaccine  Completed   HPV Vaccine  Aged Out   Conditions/risks identified: Yes; Reviewed health maintenance screenings with patient today and relevant education, vaccines, and/or referrals were provided. Please continue to do your personal lifestyle choices by: daily care of teeth and gums, regular physical activity (goal should be 5 days a week for 30 minutes), eat a healthy diet, avoid tobacco and drug use, limiting any alcohol intake, taking a low-dose aspirin (if not allergic or have been advised by your provider otherwise) and taking vitamins and minerals as recommended by your provider. Continue doing brain stimulating activities (puzzles, reading, adult coloring books, staying active) to keep memory sharp. Continue to eat heart healthy diet (full of fruits, vegetables, whole grains, lean protein, water--limit salt, fat, and sugar intake) and increase physical activity as tolerated.  Advanced directives: (Copy Requested) Please bring a copy of your health care power of attorney and living will to the office to be added to your chart at your convenience.  Next Medicare Annual Wellness Visit scheduled for next year: Yes

## 2023-04-08 ENCOUNTER — Other Ambulatory Visit: Payer: Self-pay | Admitting: Internal Medicine

## 2023-04-17 ENCOUNTER — Other Ambulatory Visit: Payer: Self-pay | Admitting: Physician Assistant

## 2023-04-17 DIAGNOSIS — M545 Low back pain, unspecified: Secondary | ICD-10-CM | POA: Diagnosis not present

## 2023-04-17 DIAGNOSIS — M48061 Spinal stenosis, lumbar region without neurogenic claudication: Secondary | ICD-10-CM

## 2023-04-17 DIAGNOSIS — M5126 Other intervertebral disc displacement, lumbar region: Secondary | ICD-10-CM

## 2023-04-17 DIAGNOSIS — M5416 Radiculopathy, lumbar region: Secondary | ICD-10-CM

## 2023-04-29 DIAGNOSIS — K080 Exfoliation of teeth due to systemic causes: Secondary | ICD-10-CM | POA: Diagnosis not present

## 2023-05-14 ENCOUNTER — Ambulatory Visit
Admission: RE | Admit: 2023-05-14 | Discharge: 2023-05-14 | Disposition: A | Discharge status: Home / Self Care | Payer: Medicare Other | Source: Ambulatory Visit | Attending: Physician Assistant | Admitting: Physician Assistant

## 2023-05-14 DIAGNOSIS — M47816 Spondylosis without myelopathy or radiculopathy, lumbar region: Secondary | ICD-10-CM | POA: Diagnosis not present

## 2023-05-14 DIAGNOSIS — M4316 Spondylolisthesis, lumbar region: Secondary | ICD-10-CM | POA: Diagnosis not present

## 2023-05-14 DIAGNOSIS — M5126 Other intervertebral disc displacement, lumbar region: Secondary | ICD-10-CM

## 2023-05-14 DIAGNOSIS — M48061 Spinal stenosis, lumbar region without neurogenic claudication: Secondary | ICD-10-CM

## 2023-05-14 DIAGNOSIS — M5416 Radiculopathy, lumbar region: Secondary | ICD-10-CM

## 2023-05-16 ENCOUNTER — Other Ambulatory Visit: Payer: Self-pay | Admitting: Internal Medicine

## 2023-05-26 DIAGNOSIS — M545 Low back pain, unspecified: Secondary | ICD-10-CM | POA: Diagnosis not present

## 2023-07-08 DIAGNOSIS — H43813 Vitreous degeneration, bilateral: Secondary | ICD-10-CM | POA: Diagnosis not present

## 2023-07-08 DIAGNOSIS — H0102A Squamous blepharitis right eye, upper and lower eyelids: Secondary | ICD-10-CM | POA: Diagnosis not present

## 2023-07-08 DIAGNOSIS — H401131 Primary open-angle glaucoma, bilateral, mild stage: Secondary | ICD-10-CM | POA: Diagnosis not present

## 2023-07-08 DIAGNOSIS — H35373 Puckering of macula, bilateral: Secondary | ICD-10-CM | POA: Diagnosis not present

## 2023-07-09 DIAGNOSIS — D1801 Hemangioma of skin and subcutaneous tissue: Secondary | ICD-10-CM | POA: Diagnosis not present

## 2023-07-09 DIAGNOSIS — L570 Actinic keratosis: Secondary | ICD-10-CM | POA: Diagnosis not present

## 2023-07-09 DIAGNOSIS — L814 Other melanin hyperpigmentation: Secondary | ICD-10-CM | POA: Diagnosis not present

## 2023-07-09 DIAGNOSIS — Z85828 Personal history of other malignant neoplasm of skin: Secondary | ICD-10-CM | POA: Diagnosis not present

## 2023-07-09 DIAGNOSIS — L821 Other seborrheic keratosis: Secondary | ICD-10-CM | POA: Diagnosis not present

## 2023-07-15 DIAGNOSIS — K080 Exfoliation of teeth due to systemic causes: Secondary | ICD-10-CM | POA: Diagnosis not present

## 2023-07-20 ENCOUNTER — Ambulatory Visit
Admission: RE | Admit: 2023-07-20 | Discharge: 2023-07-20 | Disposition: A | Discharge status: Home / Self Care | Payer: Self-pay | Source: Ambulatory Visit

## 2023-07-20 ENCOUNTER — Other Ambulatory Visit: Payer: Self-pay

## 2023-07-20 VITALS — BP 101/63 | HR 68 | Temp 97.8°F | Resp 20

## 2023-07-20 DIAGNOSIS — J069 Acute upper respiratory infection, unspecified: Secondary | ICD-10-CM | POA: Diagnosis not present

## 2023-07-20 MED ORDER — BENZONATATE 100 MG PO CAPS: 100.0000 mg | ORAL_CAPSULE | Freq: Four times a day (QID) | ORAL | 0 refills | Status: DC | PRN

## 2023-07-20 NOTE — ED Triage Notes (Addendum)
 Temperature 99.8-100.9 raspy breathing started Wednesday - Entered by patient  Fever since Wednesday, cough, fatigue, headaches, nausea, and low back pain. Had negative covid test. Taking tylenol  and using heat on his back. States today is the first day he hasn't had a fever.

## 2023-07-22 NOTE — ED Provider Notes (Signed)
 [facility]    CSN: [id number] Arrival date & time: 07/20/23  1255      History   Chief Complaint Chief Complaint  Patient presents with   Cough    HPI Edwin Ramirez is a 83 y.o. male.   Patient reports that he has had a cough and congestion for the past 4 days.  Patient reports he had a negative home COVID test.  Patient states he is feeling better today.  Patient decided that he should come in and get checked out just to make sure everything is okay.  Patient denies any fever or chills he is not experiencing any shortness of breath.  He denies any chest pain.   Cough   Past Medical History:  Diagnosis Date   Basal cell cancer    Dr Cary   DDD (degenerative disc disease)    Diverticulosis    Esophageal stricture    Dr Obie   Gastritis    GERD (gastroesophageal reflux disease)    Bertrum syndrome    Glaucoma    Hepatic cyst    History of prostate cancer    Dr Renda   Hx of adenomatous colonic polyps    Hyperlipidemia    has had good cholesterol count for years.   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   PULMONARY NODULE 05/19/2008   HEMORRHOIDS, INTERNAL 05/18/2008   ESOPHAGEAL STRICTURE 05/18/2008   Diverticulosis of colon 05/18/2008   History of colonic polyps 05/18/2008   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 Authorizing Provider  b complex vitamins tablet Take 1 tablet by mouth daily. 03/09/19  Yes Plotnikov, Aleksei V, MD  benzonatate  (TESSALON  PERLES) 100 MG capsule Take 1 capsule (100 mg total) by mouth every 6 (six) hours as needed for cough. 07/20/23 07/19/24 Yes Eain Mullendore K, PA-C  brimonidine (ALPHAGAN) 0.2 % ophthalmic solution Place 1 drop into both eyes. 10/22/22  Yes [provider]  BRIMONIDINE TARTRATE OP Apply to eye.   Yes [provider]  Cholecalciferol (VITAMIN D3) 50 MCG (2000 UT) capsule Take 1 capsule (2,000 Units total) by mouth daily. 03/09/19  Yes Plotnikov, Aleksei V, MD  dicyclomine  (BENTYL ) 10 MG capsule TAKE  1 CAPSULE BY MOUTH 4 TIMES DAILY, BEFORE MEALS AND AT BEDTIME. 04/08/23  Yes Plotnikov, Aleksei V, MD  latanoprost (XALATAN) 0.005 % ophthalmic solution Place 1 drop into both eyes at bedtime.   Yes [provider]  LORazepam  (ATIVAN ) 1 MG tablet Take 1 mg by mouth every 8 (eight) hours as needed. 03/17/23  Yes [provider]  methocarbamol  (ROBAXIN ) 500 MG tablet Take 1 tablet (500 mg total) by mouth every 8 (eight) hours as needed for muscle spasms. 03/24/23  Yes Plotnikov, Karlynn GAILS, MD  omeprazole  (PRILOSEC) 40 MG capsule TAKE 1 CAPSULE BY MOUTH TWICE A DAY 01/29/23  Yes Plotnikov, Aleksei V, MD  rosuvastatin  (CRESTOR ) 10 MG tablet TAKE 1 TABLET BY MOUTH EVERY DAY 05/16/23  Yes Plotnikov, Aleksei V, MD  traMADol  (ULTRAM ) 50 MG tablet Take 1 tablet  (50 mg total) by mouth every 6 (six) hours as needed for severe pain. 05/10/20  Yes Plotnikov, Karlynn GAILS, MD  traZODone  (DESYREL ) 50 MG tablet TAKE 1 TO 2 TABLETS BY MOUTH AT BEDTIME AS NEEDED FOR SLEEP 03/15/23  Yes Plotnikov, Karlynn GAILS, MD  methylPREDNISolone  (MEDROL  DOSEPAK) 4 MG TBPK tablet As directed Patient not taking: Reported on 07/20/2023 11/13/22   Plotnikov, Karlynn GAILS, MD    Family History Family History  Problem Relation Age of Onset   Multiple myeloma Father    Colon cancer Mother 5   Heart failure Mother    Heart attack Maternal Uncle 77   Diabetes Neg Hx    Stroke Neg Hx    Esophageal cancer Neg Hx    Rectal cancer Neg Hx    Stomach cancer Neg Hx     Social History Social History   Tobacco Use   Smoking status: Never   Smokeless tobacco: Never  Vaping Use   Vaping status: Never Used  Substance Use Topics   Alcohol use: Yes    Alcohol/week: 5.0 standard drinks of alcohol    Types: 5 Glasses of wine per week    Comment: Socially   Drug use: No     Allergies   Codeine, Nabumetone, Pneumovax [pneumococcal polysaccharide vaccine], Prevnar 13 [pneumococcal 13-val conj vacc], and Aspirin    Review of Systems Review of Systems  Respiratory:  Positive for cough.   All other systems reviewed and are negative.    Physical Exam Triage Vital Signs ED Triage Vitals  Encounter Vitals Group     BP 07/20/23 1344 101/63     Systolic BP Percentile --      Diastolic BP Percentile --      Pulse Rate 07/20/23 1344 68     Resp 07/20/23 1344 20     Temp 07/20/23 1344 97.8 F (36.6 C)     Temp Source 07/20/23 1344 Oral     SpO2 --      Weight --      Height --      Head Circumference --      Peak Flow --      Pain Score 07/20/23 1340 8     Pain Loc --      Pain Education --      Exclude from Growth Chart --    No data found.  Updated Vital Signs BP 101/63 (BP Location: Right Arm)   Pulse 68   Temp 97.8 F (36.6 C) (Oral)   Resp 20   Visual Acuity Right  Eye Distance:   Left Eye Distance:   Bilateral Distance:    Right Eye  Near:   Left Eye Near:    Bilateral Near:     Physical Exam Vitals and nursing note reviewed.  Constitutional:      General: He is not in acute distress.    Appearance: He is well-developed.  HENT:     Head: Normocephalic and atraumatic.  Eyes:     Conjunctiva/sclera: Conjunctivae normal.  Cardiovascular:     Rate and Rhythm: Normal rate and regular rhythm.     Heart sounds: No murmur heard. Pulmonary:     Effort: Pulmonary effort is normal. No respiratory distress.     Breath sounds: Normal breath sounds.  Abdominal:     Palpations: Abdomen is soft.     Tenderness: There is no abdominal tenderness.  Skin:    General: Skin is warm and dry.     Capillary Refill: Capillary refill takes less than 2 seconds.  Neurological:     Mental Status: He is alert.  Psychiatric:        Mood and Affect: Mood normal.      UC Treatments / Results  Labs (all labs ordered are listed, but only abnormal results are displayed) Labs Reviewed - No data to display  EKG   Radiology No results found.  Procedures Procedures (including critical care time)  Medications Ordered in UC Medications - No data to display  Initial Impression / Assessment and Plan / UC Course  I have reviewed the triage vital signs and the nursing notes.  Pertinent labs & imaging results that were available during my care of the patient were reviewed by me and considered in my medical decision making (see chart for details).     Patient complains of persistent cough but he is not running a fever.  Patient is requesting medication to help with the cough. Final Clinical Impressions(s) / UC Diagnoses   Final diagnoses:  Viral URI   Discharge Instructions   None    ED Prescriptions     Medication Sig Dispense Auth. Provider   benzonatate  (TESSALON  PERLES) 100 MG capsule Take 1 capsule (100 mg total) by mouth every 6 (six) hours as  needed for cough. 30 capsule Allissa Albright K, PA-C      PDMP not reviewed this encounter. An After Visit Summary was printed and given to the patient.       Flint Sonny POUR, PA-C 07/22/23 [phone]

## 2023-07-30 DIAGNOSIS — H35373 Puckering of macula, bilateral: Secondary | ICD-10-CM | POA: Diagnosis not present

## 2023-07-30 DIAGNOSIS — H401131 Primary open-angle glaucoma, bilateral, mild stage: Secondary | ICD-10-CM | POA: Diagnosis not present

## 2023-07-30 DIAGNOSIS — H0102A Squamous blepharitis right eye, upper and lower eyelids: Secondary | ICD-10-CM | POA: Diagnosis not present

## 2023-07-30 DIAGNOSIS — H43813 Vitreous degeneration, bilateral: Secondary | ICD-10-CM | POA: Diagnosis not present

## 2023-08-01 ENCOUNTER — Ambulatory Visit: Payer: Medicare Other | Admitting: Family Medicine

## 2023-08-01 ENCOUNTER — Encounter: Payer: Self-pay | Admitting: Family Medicine

## 2023-08-01 VITALS — BP 120/66 | HR 61 | Temp 98.7°F | Ht 67.0 in | Wt 151.2 lb

## 2023-08-01 DIAGNOSIS — B9689 Other specified bacterial agents as the cause of diseases classified elsewhere: Secondary | ICD-10-CM | POA: Diagnosis not present

## 2023-08-01 DIAGNOSIS — R051 Acute cough: Secondary | ICD-10-CM | POA: Diagnosis not present

## 2023-08-01 DIAGNOSIS — J069 Acute upper respiratory infection, unspecified: Secondary | ICD-10-CM | POA: Diagnosis not present

## 2023-08-01 LAB — POCT RESPIRATORY SYNCYTIAL VIRUS: RSV Rapid Ag: NEGATIVE

## 2023-08-01 LAB — POCT INFLUENZA A/B
Influenza A, POC: NEGATIVE
Influenza B, POC: NEGATIVE

## 2023-08-01 MED ORDER — HYDROCODONE BIT-HOMATROP MBR 5-1.5 MG/5ML PO SOLN
5.0000 mL | Freq: Three times a day (TID) | ORAL | 0 refills | Status: DC | PRN
Start: 2023-08-01 — End: 2023-10-22

## 2023-08-01 MED ORDER — AZITHROMYCIN 250 MG PO TABS
ORAL_TABLET | ORAL | 0 refills | Status: AC
Start: 2023-08-01 — End: 2023-08-06

## 2023-08-01 NOTE — Progress Notes (Signed)
 Acute Office Visit  Subjective:     Patient ID: Edwin Ramirez, male    DOB: [birth year], 83 y.o.   MRN: [id number]  Chief Complaint  Patient presents with   Acute Visit    Ongoing cough since beginning of February, has been taking Robitussin Max. Had fevers of 100 degrees, for 3 days 14-16th. Yellow mucus    HPI Patient is in today for evaluation of cough and congestion, for the last 3 weeks. Has tried Robitussin max with no relief. Was seen at urgent care on July 20, 2023, was sent [employer].  Patient states they made no difference. Reports that he did have a fever on February 14-16, then spontaneously resolved. Denies known sick contacts. Denies abdominal pain, nausea, vomiting, diarrhea, rash, chills, other symptoms.  Medical hx as outlined below.  ROS Per HPI      Objective:    BP 120/66 (BP Location: Left Arm, Patient Position: Sitting, Cuff Size: Normal)   Pulse 61   Temp 98.7 F (37.1 C) (Temporal)   Ht 5\' 7"  (1.702 m)   Wt 151 lb 3.2 oz (68.6 kg)   SpO2 98%   BMI 23.68 kg/m    Physical Exam Vitals and nursing note reviewed.  Constitutional:      General: He is not in acute distress.    Comments: Appears fatigued  HENT:     Head: Normocephalic and atraumatic.     Right Ear: Ear canal normal.     Left Ear: Tympanic membrane and ear canal normal.     Nose: Congestion present.     Mouth/Throat:     Mouth: Mucous membranes are moist.     Pharynx: Oropharynx is clear. No oropharyngeal exudate or posterior oropharyngeal erythema.     Comments: Sounds congested Eyes:     Extraocular Movements: Extraocular movements intact.  Cardiovascular:     Rate and Rhythm: Normal rate and regular rhythm.     Heart sounds: Normal heart sounds.  Pulmonary:     Effort: Pulmonary effort is normal. No respiratory distress.     Breath sounds: No wheezing, rhonchi or rales.  Musculoskeletal:     Cervical back: Normal range of motion.  Lymphadenopathy:      Cervical: Cervical adenopathy present.  Skin:    Capillary Refill: Capillary refill takes less than 2 seconds.  Neurological:     General: No focal deficit present.     Mental Status: He is alert and oriented to person, place, and time.    Results for orders placed or performed in visit on 08/01/23  POCT Influenza A/B  Result Value Ref Range   Influenza A, POC Negative Negative   Influenza B, POC Negative Negative  POCT respiratory syncytial virus  Result Value Ref Range   RSV Rapid Ag Negative         Assessment & Plan:  1. Acute cough (Primary)  - POCT Influenza A/B - POCT respiratory syncytial virus - HYDROcodone bit-homatropine (HYCODAN) 5-1.5 MG/5ML syrup; Take 5 mLs by mouth every 8 (eight) hours as needed for cough.  Dispense: 120 mL; Refill: 0 - Viral testing negative  2. Bacterial URI  - azithromycin (ZITHROMAX) 250 MG tablet; Take 2 tablets on day 1, then 1 tablet daily on days 2 through 5  Dispense: 6 tablet; Refill: 0   Meds ordered this encounter  Medications   azithromycin (ZITHROMAX) 250 MG tablet    Sig: Take 2 tablets on day 1, then 1 tablet daily on  days 2 through 5    Dispense:  6 tablet    Refill:  0   HYDROcodone bit-homatropine (HYCODAN) 5-1.5 MG/5ML syrup    Sig: Take 5 mLs by mouth every 8 (eight) hours as needed for cough.    Dispense:  120 mL    Refill:  0    Return if symptoms worsen or fail to improve.  Moshe Cipro, FNP

## 2023-08-01 NOTE — Patient Instructions (Signed)
 Flu, RSV testing negative today.  I have sent in azithromycin for you to take.  Take 2 tablets today, then 1 tablet daily for the next 4 days.  I have sent in hydrocodone cough syrup for you to take 5 mL once daily in the evening as needed for cough.  This medication may make you sleepy.  Do not drive or operate heavy machinery while taking this medication.  Follow-up with me if symptoms are persisting over the next week.

## 2023-09-04 ENCOUNTER — Other Ambulatory Visit: Payer: Self-pay | Admitting: Internal Medicine

## 2023-09-08 ENCOUNTER — Other Ambulatory Visit: Payer: Self-pay | Admitting: Internal Medicine

## 2023-09-22 ENCOUNTER — Ambulatory Visit: Payer: Medicare Other | Admitting: Internal Medicine

## 2023-09-30 ENCOUNTER — Ambulatory Visit (INDEPENDENT_AMBULATORY_CARE_PROVIDER_SITE_OTHER): Payer: Medicare Other | Admitting: Internal Medicine

## 2023-09-30 ENCOUNTER — Encounter: Payer: Self-pay | Admitting: Internal Medicine

## 2023-09-30 VITALS — BP 90/52 | HR 57 | Temp 97.6°F | Ht 67.0 in | Wt 156.6 lb

## 2023-09-30 DIAGNOSIS — E785 Hyperlipidemia, unspecified: Secondary | ICD-10-CM | POA: Diagnosis not present

## 2023-09-30 DIAGNOSIS — H6590 Unspecified nonsuppurative otitis media, unspecified ear: Secondary | ICD-10-CM | POA: Insufficient documentation

## 2023-09-30 DIAGNOSIS — K219 Gastro-esophageal reflux disease without esophagitis: Secondary | ICD-10-CM

## 2023-09-30 DIAGNOSIS — H6502 Acute serous otitis media, left ear: Secondary | ICD-10-CM

## 2023-09-30 MED ORDER — METHYLPREDNISOLONE 4 MG PO TBPK
ORAL_TABLET | ORAL | 0 refills | Status: DC
Start: 2023-09-30 — End: 2024-03-24

## 2023-09-30 MED ORDER — PSEUDOEPHEDRINE HCL ER 120 MG PO TB12: 120.0000 mg | ORAL_TABLET | Freq: Two times a day (BID) | ORAL | 1 refills | Status: DC | PRN

## 2023-09-30 MED ORDER — FLUTICASONE PROPIONATE 50 MCG/ACT NA SUSP: 2.0000 | Freq: Every day | NASAL | 6 refills | Status: AC

## 2023-09-30 NOTE — Assessment & Plan Note (Signed)
 Try Flonase  and Afrin x 1 week If not better - add Medrol  pack and Sudafed

## 2023-09-30 NOTE — Assessment & Plan Note (Signed)
 On Crestor

## 2023-09-30 NOTE — Assessment & Plan Note (Signed)
On Prilosec

## 2023-09-30 NOTE — Progress Notes (Signed)
 Subjective:  Patient ID: Edwin Ramirez, male    DOB: 1941/03/19  Age: 83 y.o. MRN: [id number]  CC: Medical Management of Chronic Issues (6 Month Follow Up)   HPI Edwin Ramirez presents for L ear popping 2 weeks off and on F/u on GERD, elev lipids  Outpatient Medications Prior to Visit  Medication Sig Dispense Refill   b complex vitamins tablet Take 1 tablet by mouth daily. 100 tablet 3   brimonidine (ALPHAGAN) 0.2 % ophthalmic solution Place 1 drop into both eyes.     BRIMONIDINE TARTRATE OP Apply to eye.     Cholecalciferol (VITAMIN D3) 50 MCG (2000 UT) capsule Take 1 capsule (2,000 Units total) by mouth daily. 100 capsule 3   dicyclomine  (BENTYL ) 10 MG capsule TAKE 1 CAPSULE BY MOUTH 4 TIMES DAILY, BEFORE MEALS AND AT BEDTIME. 100 capsule 1   HYDROcodone  bit-homatropine (HYCODAN) 5-1.5 MG/5ML syrup Take 5 mLs by mouth every 8 (eight) hours as needed for cough. 120 mL 0   latanoprost (XALATAN) 0.005 % ophthalmic solution Place 1 drop into both eyes at bedtime.     LORazepam  (ATIVAN ) 1 MG tablet TAKE 1 TABLET BY MOUTH EVERY 8 HOURS AS NEEDED FOR ANXIETY 90 tablet 2   methocarbamol  (ROBAXIN ) 500 MG tablet Take 1 tablet (500 mg total) by mouth every 8 (eight) hours as needed for muscle spasms. 30 tablet 1   omeprazole  (PRILOSEC) 40 MG capsule TAKE 1 CAPSULE BY MOUTH TWICE A DAY 180 capsule 2   rosuvastatin  (CRESTOR ) 10 MG tablet TAKE 1 TABLET BY MOUTH EVERY DAY 90 tablet 3   traMADol  (ULTRAM ) 50 MG tablet Take 1 tablet (50 mg total) by mouth every 6 (six) hours as needed for severe pain. 20 tablet 0   traZODone  (DESYREL ) 50 MG tablet TAKE 1 TO 2 TABLETS BY MOUTH AT BEDTIME AS NEEDED FOR SLEEP 60 tablet 0   benzonatate  (TESSALON  PERLES) 100 MG capsule Take 1 capsule (100 mg total) by mouth every 6 (six) hours as needed for cough. (Patient not taking: Reported on 09/30/2023) 30 capsule 0   methylPREDNISolone  (MEDROL  DOSEPAK) 4 MG TBPK tablet As directed (Patient not taking:  Reported on 09/30/2023) 21 tablet 0   No facility-administered medications prior to visit.    ROS: Review of Systems  Constitutional:  Negative for appetite change, fatigue and unexpected weight change.  HENT:  Negative for congestion, nosebleeds, sneezing, sore throat and trouble swallowing.   Eyes:  Negative for itching and visual disturbance.  Respiratory:  Negative for cough.   Cardiovascular:  Negative for chest pain, palpitations and leg swelling.  Gastrointestinal:  Negative for abdominal distention, blood in stool, diarrhea and nausea.  Genitourinary:  Negative for frequency and hematuria.  Musculoskeletal:  Negative for back pain, gait problem, joint swelling and neck pain.  Skin:  Negative for rash.  Neurological:  Negative for dizziness, tremors, speech difficulty and weakness.  Psychiatric/Behavioral:  Negative for agitation, dysphoric mood and sleep disturbance. The patient is not nervous/anxious.     Objective:  BP (!) 90/52   Pulse (!) 57   Temp 97.6 F (36.4 C)   Ht 5\' 7"  (1.702 m)   Wt 156 lb 9.6 oz (71 kg)   SpO2 97%   BMI 24.53 kg/m   BP Readings from Last 3 Encounters:  09/30/23 (!) 90/52  08/01/23 120/66  07/20/23 101/63    Wt Readings from Last 3 Encounters:  09/30/23 156 lb 9.6 oz (71 kg)  08/01/23 151 lb  3.2 oz (68.6 kg)  03/27/23 155 lb (70.3 kg)    Physical Exam Constitutional:      General: He is not in acute distress.    Appearance: He is well-developed.     Comments: NAD  Eyes:     Conjunctiva/sclera: Conjunctivae normal.     Pupils: Pupils are equal, round, and reactive to light.  Neck:     Thyroid : No thyromegaly.     Vascular: No JVD.  Cardiovascular:     Rate and Rhythm: Normal rate and regular rhythm.     Heart sounds: Normal heart sounds. No murmur heard.    No friction rub. No gallop.  Pulmonary:     Effort: Pulmonary effort is normal. No respiratory distress.     Breath sounds: Normal breath sounds. No wheezing or rales.   Chest:     Chest wall: No tenderness.  Abdominal:     General: Bowel sounds are normal. There is no distension.     Palpations: Abdomen is soft. There is no mass.     Tenderness: There is no abdominal tenderness. There is no guarding or rebound.  Musculoskeletal:        General: No tenderness. Normal range of motion.     Cervical back: Normal range of motion.  Lymphadenopathy:     Cervical: No cervical adenopathy.  Skin:    General: Skin is warm and dry.     Findings: No rash.  Neurological:     Mental Status: He is alert and oriented to person, place, and time.     Cranial Nerves: No cranial nerve deficit.     Motor: No abnormal muscle tone.     Coordination: Coordination normal.     Gait: Gait normal.     Deep Tendon Reflexes: Reflexes are normal and symmetric.  Psychiatric:        Behavior: Behavior normal.        Thought Content: Thought content normal.        Judgment: Judgment normal.    No wax B  Lab Results  Component Value Date   WBC 4.7 03/24/2023   HGB 13.2 03/24/2023   HCT 40.6 03/24/2023   PLT 149.0 (L) 03/24/2023   GLUCOSE 91 03/24/2023   CHOL 128 03/24/2023   TRIG 114.0 03/24/2023   HDL 42.80 03/24/2023   LDLDIRECT 78.8 02/05/2011   LDLCALC 62 03/24/2023   ALT 17 03/24/2023   AST 21 03/24/2023   NA 140 03/24/2023   K 4.1 03/24/2023   CL 106 03/24/2023   CREATININE 1.09 03/24/2023   BUN 17 03/24/2023   CO2 29 03/24/2023   TSH 2.36 03/24/2023   PSA 0.00 (L) 03/24/2023   HGBA1C 5.7 03/07/2014    No results found.  Assessment & Plan:   Problem List Items Addressed This Visit     Dyslipidemia   On Crestor       GERD   On Prilosec      Serous otitis media - Primary   Try Flonase  and Afrin x 1 week If not better - add Medrol  pack and Sudafed          Meds ordered this encounter  Medications   fluticasone  (FLONASE ) 50 MCG/ACT nasal spray    Sig: Place 2 sprays into both nostrils daily.    Dispense:  16 g    Refill:  6    methylPREDNISolone  (MEDROL  DOSEPAK) 4 MG TBPK tablet    Sig: As directed    Dispense:  21 tablet  Refill:  0   pseudoephedrine  (SUDAFED) 120 MG 12 hr tablet    Sig: Take 1 tablet (120 mg total) by mouth 2 (two) times daily as needed for congestion.    Dispense:  30 tablet    Refill:  1      Follow-up: Return in about 6 months (around 03/31/2024) for Wellness Exam.  Anitra Barn, MD

## 2023-09-30 NOTE — Patient Instructions (Signed)
 Try Flonase  and Afrin x 1 week If not better - add Medrol  pack and Sudafed

## 2023-10-13 DIAGNOSIS — H35373 Puckering of macula, bilateral: Secondary | ICD-10-CM | POA: Diagnosis not present

## 2023-10-13 DIAGNOSIS — H0102B Squamous blepharitis left eye, upper and lower eyelids: Secondary | ICD-10-CM | POA: Diagnosis not present

## 2023-10-13 DIAGNOSIS — H401131 Primary open-angle glaucoma, bilateral, mild stage: Secondary | ICD-10-CM | POA: Diagnosis not present

## 2023-10-13 DIAGNOSIS — H0102A Squamous blepharitis right eye, upper and lower eyelids: Secondary | ICD-10-CM | POA: Diagnosis not present

## 2023-10-22 ENCOUNTER — Other Ambulatory Visit: Payer: Self-pay | Admitting: Internal Medicine

## 2023-10-22 ENCOUNTER — Ambulatory Visit (INDEPENDENT_AMBULATORY_CARE_PROVIDER_SITE_OTHER): Admitting: Emergency Medicine

## 2023-10-22 ENCOUNTER — Encounter: Payer: Self-pay | Admitting: Emergency Medicine

## 2023-10-22 VITALS — BP 112/60 | HR 71 | Temp 98.0°F | Ht 67.0 in | Wt 152.0 lb

## 2023-10-22 DIAGNOSIS — R053 Chronic cough: Secondary | ICD-10-CM

## 2023-10-22 DIAGNOSIS — J22 Unspecified acute lower respiratory infection: Secondary | ICD-10-CM | POA: Insufficient documentation

## 2023-10-22 DIAGNOSIS — R6889 Other general symptoms and signs: Secondary | ICD-10-CM

## 2023-10-22 LAB — POCT INFLUENZA A/B
Influenza A, POC: NEGATIVE
Influenza B, POC: NEGATIVE

## 2023-10-22 LAB — POC COVID19 BINAXNOW: SARS Coronavirus 2 Ag: NEGATIVE

## 2023-10-22 MED ORDER — HYDROCODONE BIT-HOMATROP MBR 5-1.5 MG/5ML PO SOLN
5.0000 mL | Freq: Every evening | ORAL | 0 refills | Status: AC | PRN
Start: 2023-10-22 — End: ?

## 2023-10-22 MED ORDER — AZITHROMYCIN 250 MG PO TABS
ORAL_TABLET | ORAL | 0 refills | Status: DC
Start: 2023-10-22 — End: 2024-03-24

## 2023-10-22 MED ORDER — BENZONATATE 200 MG PO CAPS
200.0000 mg | ORAL_CAPSULE | Freq: Two times a day (BID) | ORAL | 0 refills | Status: DC | PRN
Start: 2023-10-22 — End: 2024-03-24

## 2023-10-22 NOTE — Assessment & Plan Note (Signed)
 Cough management discussed Advised to rest and stay well-hydrated Recommend over-the-counter Mucinex DM and cough drops Tessalon  200 mg 3 times a day Hycodan syrup at bedtime

## 2023-10-22 NOTE — Patient Instructions (Signed)
 Acute Bronchitis, Adult  Acute bronchitis is when air tubes in the lungs (bronchi) suddenly get swollen. The condition can make it hard for you to breathe. In adults, acute bronchitis usually goes away within 2 weeks. A cough caused by bronchitis may last up to 3 weeks. Smoking, allergies, and asthma can make the condition worse. What are the causes? Germs that cause cold and flu (viruses). The most common cause of this condition is the virus that causes the common cold. Bacteria. Substances that bother (irritate) the lungs, including: Smoke from cigarettes and other types of tobacco. Dust and pollen. Fumes from chemicals, gases, or burned fuel. Indoor or outdoor air pollution. What increases the risk? A weak body's defense system. This is also called the immune system. Any condition that affects your lungs and breathing, such as asthma. What are the signs or symptoms? A cough. Coughing up clear, yellow, or green mucus. Making high-pitched whistling sounds when you breathe, most often when you breathe out (wheezing). Runny or stuffy nose. Having too much mucus in your lungs (chest congestion). Shortness of breath. Body aches. A sore throat. How is this treated? Acute bronchitis may go away over time without treatment. Your doctor may tell you to: Drink more fluids. This will help thin your mucus so it is easier to cough up. Use a device that gets medicine into your lungs (inhaler). Use a vaporizer or a humidifier. These are machines that add water to the air. This helps with coughing and poor breathing. Take a medicine that thins mucus and helps clear it from your lungs. Take a medicine that prevents or stops coughing. It is not common to take an antibiotic medicine for this condition. Follow these instructions at home:  Take over-the-counter and prescription medicines only as told by your doctor. Use an inhaler, vaporizer, or humidifier as told by your doctor. Take two teaspoons  (10 mL) of honey at bedtime. This helps lessen your coughing at night. Drink enough fluid to keep your pee (urine) pale yellow. Do not smoke or use any products that contain nicotine or tobacco. If you need help quitting, ask your doctor. Get a lot of rest. Return to your normal activities when your doctor says that it is safe. Keep all follow-up visits. How is this prevented?  Wash your hands often with soap and water for at least 20 seconds. If you cannot use soap and water, use hand sanitizer. Avoid contact with people who have cold symptoms. Try not to touch your mouth, nose, or eyes with your hands. Avoid breathing in smoke or chemical fumes. Make sure to get the flu shot every year. Contact a doctor if: Your symptoms do not get better in 2 weeks. You have trouble coughing up the mucus. Your cough keeps you awake at night. You have a fever. Get help right away if: You cough up blood. You have chest pain. You have very bad shortness of breath. You faint or keep feeling like you are going to faint. You have a very bad headache. Your fever or chills get worse. These symptoms may be an emergency. Get help right away. Call your local emergency services (911 in the [place] Do not wait to see if the symptoms will go away. Do not drive yourself to the hospital. Summary Acute bronchitis is when air tubes in the lungs (bronchi) suddenly get swollen. In adults, acute bronchitis usually goes away within 2 weeks. Drink more fluids. This will help thin your mucus so it is easier  to cough up. Take over-the-counter and prescription medicines only as told by your doctor. Contact a doctor if your symptoms do not improve after 2 weeks of treatment. This information is not intended to replace advice given to you by your health care provider. Make sure you discuss any questions you have with your health care provider. Document Revised: 09/27/2020 Document Reviewed: 09/27/2020 Elsevier Patient  Education  2024 [employer].

## 2023-10-22 NOTE — Assessment & Plan Note (Signed)
 Symptom management discussed Take Tylenol  and or Advil as needed Advised to rest and stay well-hydrated

## 2023-10-22 NOTE — Assessment & Plan Note (Signed)
 Upper viral respiratory infection now with secondary bacterial infection. Recommend daily azithromycin  for 5 days Clinically stable.  No red flag signs or symptoms. Advised to rest and stay well-hydrated Symptom management discussed ED precautions given No signs or findings of pneumonia Advised to contact the office if no better or worse during the next several days

## 2023-10-22 NOTE — Progress Notes (Signed)
 Edwin Ramirez 83 y.o.   Chief Complaint  Patient presents with   Cough    Patient states he's been having a head cold, cough, head pressure, eyes and chest congestion. Patient states he is coughing up yellow mucus. He is taking OTC medication which seems to not be doing anything. Patient has not tested himself for anything.     HISTORY OF PRESENT ILLNESS: Acute problem visit today.  Patient of Dr. Fabio Holts Plotnikov. This is a 83 y.o. male complaining of flulike symptoms that started 1 week ago Has persistent productive cough with sinus and chest congestion. Has not tested for COVID or flu yet. Denies difficulty breathing or chest pain. Able to eat and drink.  Denies nausea, vomiting, abdominal pain or diarrhea. Gets up at night coughing and is not able to get back to sleep. No other complaints or medical concerns today.   Cough Pertinent negatives include no chest pain, chills, fever, headaches, rash or shortness of breath.     Prior to Admission medications   Medication Sig Start Date End Date Taking? Authorizing Provider  b complex vitamins tablet Take 1 tablet by mouth daily. 03/09/19  Yes Plotnikov, Aleksei V, MD  brimonidine (ALPHAGAN) 0.2 % ophthalmic solution Place 1 drop into both eyes. 10/22/22  Yes [provider]  BRIMONIDINE TARTRATE OP Apply to eye.   Yes [provider]  Cholecalciferol (VITAMIN D3) 50 MCG (2000 UT) capsule Take 1 capsule (2,000 Units total) by mouth daily. 03/09/19  Yes Plotnikov, Oakley Bellman, MD  dicyclomine  (BENTYL ) 10 MG capsule TAKE 1 CAPSULE BY MOUTH 4 TIMES DAILY, BEFORE MEALS AND AT BEDTIME. 09/09/23  Yes Plotnikov, Oakley Bellman, MD  fluticasone  (FLONASE ) 50 MCG/ACT nasal spray Place 2 sprays into both nostrils daily. 09/30/23  Yes Plotnikov, Aleksei V, MD  latanoprost (XALATAN) 0.005 % ophthalmic solution Place 1 drop into both eyes at bedtime.   Yes [provider]  LORazepam  (ATIVAN ) 1 MG tablet TAKE 1 TABLET BY MOUTH  EVERY 8 HOURS AS NEEDED FOR ANXIETY 09/05/23  Yes Plotnikov, Oakley Bellman, MD  methocarbamol  (ROBAXIN ) 500 MG tablet Take 1 tablet (500 mg total) by mouth every 8 (eight) hours as needed for muscle spasms. 03/24/23  Yes Plotnikov, Oakley Bellman, MD  omeprazole  (PRILOSEC) 40 MG capsule TAKE 1 CAPSULE BY MOUTH TWICE A DAY 01/29/23  Yes Plotnikov, Aleksei V, MD  rosuvastatin  (CRESTOR ) 10 MG tablet TAKE 1 TABLET BY MOUTH EVERY DAY 05/16/23  Yes Plotnikov, Aleksei V, MD  traMADol  (ULTRAM ) 50 MG tablet Take 1 tablet (50 mg total) by mouth every 6 (six) hours as needed for severe pain. 05/10/20  Yes Plotnikov, Oakley Bellman, MD  traZODone  (DESYREL ) 50 MG tablet TAKE 1 TO 2 TABLETS BY MOUTH AT BEDTIME AS NEEDED FOR SLEEP 03/15/23  Yes Plotnikov, Oakley Bellman, MD  benzonatate  (TESSALON  PERLES) 100 MG capsule Take 1 capsule (100 mg total) by mouth every 6 (six) hours as needed for cough. Patient not taking: Reported on 10/22/2023 07/20/23 07/19/24  Sandi Crosby, PA-C  HYDROcodone  bit-homatropine (HYCODAN) 5-1.5 MG/5ML syrup Take 5 mLs by mouth every 8 (eight) hours as needed for cough. Patient not taking: Reported on 10/22/2023 08/01/23   Wellington Half, FNP  methylPREDNISolone  (MEDROL  DOSEPAK) 4 MG TBPK tablet As directed Patient not taking: Reported on 10/22/2023 09/30/23   Plotnikov, Oakley Bellman, MD  pseudoephedrine  (SUDAFED) 120 MG 12 hr tablet Take 1 tablet (120 mg total) by mouth 2 (two) times daily as needed for congestion. Patient  not taking: Reported on 10/22/2023 09/30/23 09/29/24  Plotnikov, Oakley Bellman, MD    Allergies  Allergen Reactions   Codeine     REACTION: agitation, mental status changes with high doses of codeine post op Can take Tramadol  OK, low dose Norco ok   Nabumetone     Mental status changes post op with Relafen   Pneumovax [Pneumococcal Polysaccharide Vaccine]     swelling   Prevnar 13 [Pneumococcal 13-Val Conj Vacc]     rash   Aspirin      gastritis    Patient Active Problem List   Diagnosis  Date Noted   Serous otitis media 09/30/2023   Chronic venous insufficiency 12/11/2022   Ear pain, left 11/13/2022   Bruising 11/13/2022   Positive colorectal cancer screening using Cologuard test 08/06/2022   Penile mass 10/07/2021   Carotid bruit 03/15/2019   Insomnia 03/09/2019   CAD (coronary artery disease) 07/07/2018   Low back pain 03/01/2016   DECREASED HEARING 01/17/2009   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   PULMONARY NODULE 05/19/2008   HEMORRHOIDS, INTERNAL 05/18/2008   ESOPHAGEAL STRICTURE 05/18/2008   Diverticulosis of colon 05/18/2008   History of colonic polyps 05/18/2008   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   Internal hemorrhoids    Pulmonary nodule    not seen on F/U 2014   TMJ (dislocation of temporomandibular joint)     Past Surgical History:  Procedure Laterality Date   APPENDECTOMY     cataract surgery  2012   bilateral   CHOLECYSTECTOMY  1985   COLONOSCOPY W/ POLYPECTOMY  2004   Negative 2009 & 2013 Dr.Brodie   ESOPHAGEAL DILATION  2004   MOHS SURGERY     Basal Cell   PROSTATECTOMY  07/2005   Dr.Borden   ROTATOR CUFF REPAIR      X 2-Left; Dr Abigail Abler    Social History   Socioeconomic History   Marital status: Married    Spouse name: Not on file   Number of children: 2   Years of education: Not on file   Highest education level: Not on file  Occupational History    Occupation: retired   Occupation: retired  Tobacco Use   Smoking status: Never   Smokeless tobacco: Never  Vaping Use   Vaping status: Never Used  Substance and Sexual Activity   Alcohol use: Yes    Alcohol/week: 5.0 standard drinks of alcohol    Types: 5 Glasses of wine per week    Comment: Socially   Drug use: No   Sexual activity: Not Currently  Other Topics Concern   Not on file  Social History Narrative   DAILY CAFFEINE   Social Drivers of Health   Financial Resource Strain: Low Risk  (03/27/2023)   Overall Financial Resource Strain (CARDIA)  Difficulty of Paying Living Expenses: Not hard at all  Food Insecurity: No Food Insecurity (03/27/2023)   Hunger Vital Sign    Worried About Running Out of Food in the Last Year: Never true    Ran Out of Food in the Last Year: Never true  Transportation Needs: No Transportation Needs (03/27/2023)   PRAPARE - Administrator, Civil Service (Medical): No    Lack of Transportation (Non-Medical): No  Physical Activity: Sufficiently Active (03/27/2023)   Exercise Vital Sign    Days of Exercise per Week: 5 days    Minutes of Exercise per Session: 60 min  Stress: No Stress Concern Present (03/27/2023)   [employer] of Occupational Health - Occupational Stress Questionnaire    Feeling of Stress : Not at all  Social Connections: Socially Integrated (03/27/2023)   Social Connection and Isolation Panel [NHANES]    Frequency of Communication with Friends and Family: More than three times a week    Frequency of Social Gatherings with Friends and Family: More than three times a week    Attends Religious Services: More than 4 times per year    Active Member of [employer] or Organizations: Yes    Attends Engineer, structural: More than 4 times per year    Marital Status: Married  Catering manager Violence: Not At Risk (03/27/2023)   Humiliation, Afraid, Rape, and Kick questionnaire    Fear of Current or Ex-Partner: No     Emotionally Abused: No    Physically Abused: No    Sexually Abused: No    Family History  Problem Relation Age of Onset   Multiple myeloma Father    Colon cancer Mother 31   Heart failure Mother    Heart attack Maternal Uncle 61   Diabetes Neg Hx    Stroke Neg Hx    Esophageal cancer Neg Hx    Rectal cancer Neg Hx    Stomach cancer Neg Hx      Review of Systems  Constitutional: Negative.  Negative for chills and fever.  HENT:  Positive for congestion.   Respiratory:  Positive for cough and sputum production. Negative for shortness of breath.   Cardiovascular: Negative.  Negative for chest pain and palpitations.  Gastrointestinal:  Negative for abdominal pain, diarrhea, nausea and vomiting.  Genitourinary: Negative.  Negative for dysuria and hematuria.  Skin: Negative.  Negative for rash.  Neurological: Negative.  Negative for dizziness and headaches.  All other systems reviewed and are negative.   Vitals:   10/22/23 0839  BP: 112/60  Pulse: 71  Temp: 98 F (36.7 C)  SpO2: 95%    Physical Exam Vitals reviewed.  Constitutional:      Appearance: Normal appearance.  HENT:     Head: Normocephalic.     Mouth/Throat:     Mouth: Mucous membranes are moist.     Pharynx: Oropharynx is clear.  Eyes:     Extraocular Movements: Extraocular movements intact.     Conjunctiva/sclera: Conjunctivae normal.     Pupils: Pupils are equal, round, and reactive to light.  Cardiovascular:     Rate and Rhythm: Normal rate and regular rhythm.     Pulses: Normal pulses.     Heart sounds: Normal heart sounds.  Pulmonary:     Effort: Pulmonary effort is normal.     Breath sounds: Normal breath sounds.  Musculoskeletal:     Cervical back: No tenderness.  Lymphadenopathy:     Cervical: No cervical adenopathy.  Skin:    General: Skin is warm and dry.     Capillary Refill: Capillary refill takes less than 2 seconds.  Neurological:     General: No focal deficit present.      Mental Status: He is alert and oriented to person, place, and time.  Psychiatric:        Mood and Affect: Mood normal.        Behavior: Behavior normal.    Results for orders placed or performed in visit on 10/22/23 (from the past 24 hours)  POC COVID-19     Status: None   Collection Time: 10/22/23  9:13 AM  Result Value Ref Range   SARS Coronavirus 2 Ag Negative Negative  POCT Influenza A/B     Status: None   Collection Time: 10/22/23  9:13 AM  Result Value Ref Range   Influenza A, POC Negative Negative   Influenza B, POC Negative Negative      ASSESSMENT & PLAN: A total of 32 minutes was spent with the patient and counseling/coordination of care regarding preparing for this visit, review of most recent office visit notes, review of multiple chronic medical conditions under management, review of all medications, diagnosis of lower respiratory infection and need for antibiotics, symptom management, prognosis, documentation and need for follow-up if no better or worse during the next several days.  Problem List Items Addressed This Visit       Respiratory   Lower respiratory infection - Primary   Upper viral respiratory infection now with secondary bacterial infection. Recommend daily azithromycin  for 5 days Clinically stable.  No red flag signs or symptoms. Advised to rest and stay well-hydrated Symptom management discussed ED precautions given No signs or findings of pneumonia Advised to contact the office if no better or worse during the next several days      Relevant Medications   azithromycin  (ZITHROMAX ) 250 MG tablet     Other   Persistent cough   Cough management discussed Advised to rest and stay well-hydrated Recommend over-the-counter Mucinex DM and cough drops Tessalon  200 mg 3 times a day Hycodan syrup at bedtime      Relevant Medications   benzonatate  (TESSALON ) 200 MG capsule   HYDROcodone  bit-homatropine (HYCODAN) 5-1.5 MG/5ML syrup   Other Relevant  Orders   POC COVID-19   POCT Influenza A/B   Flu-like symptoms   Symptom management discussed Take Tylenol  and or Advil as needed Advised to rest and stay well-hydrated      Patient Instructions  Acute Bronchitis, Adult  Acute bronchitis is when air tubes in the lungs (bronchi) suddenly get swollen. The condition can make it hard for you to breathe. In adults, acute bronchitis usually goes away within 2 weeks. A cough caused by bronchitis may last up to 3 weeks. Smoking, allergies, and asthma can make the condition worse. What are the causes? Germs that cause cold and flu (viruses). The most common cause of this condition is the virus that causes the common cold. Bacteria. Substances that bother (irritate) the lungs, including: Smoke from cigarettes and other types of tobacco. Dust and pollen. Fumes from chemicals, gases, or burned fuel. Indoor or outdoor air pollution. What increases the risk? A weak body's defense system. This is also called the immune system. Any condition that affects your lungs and breathing, such as asthma. What are the signs or symptoms? A cough. Coughing up clear, yellow, or green mucus. Making high-pitched whistling sounds when you breathe, most often when you breathe  out (wheezing). Runny or stuffy nose. Having too much mucus in your lungs (chest congestion). Shortness of breath. Body aches. A sore throat. How is this treated? Acute bronchitis may go away over time without treatment. Your doctor may tell you to: Drink more fluids. This will help thin your mucus so it is easier to cough up. Use a device that gets medicine into your lungs (inhaler). Use a vaporizer or a humidifier. These are machines that add water to the air. This helps with coughing and poor breathing. Take a medicine that thins mucus and helps clear it from your lungs. Take a medicine that prevents or stops coughing. It is not common to take an antibiotic medicine for this  condition. Follow these instructions at home:  Take over-the-counter and prescription medicines only as told by your doctor. Use an inhaler, vaporizer, or humidifier as told by your doctor. Take two teaspoons (10 mL) of honey at bedtime. This helps lessen your coughing at night. Drink enough fluid to keep your pee (urine) pale yellow. Do not smoke or use any products that contain nicotine or tobacco. If you need help quitting, ask your doctor. Get a lot of rest. Return to your normal activities when your doctor says that it is safe. Keep all follow-up visits. How is this prevented?  Wash your hands often with soap and water for at least 20 seconds. If you cannot use soap and water, use hand sanitizer. Avoid contact with people who have cold symptoms. Try not to touch your mouth, nose, or eyes with your hands. Avoid breathing in smoke or chemical fumes. Make sure to get the flu shot every year. Contact a doctor if: Your symptoms do not get better in 2 weeks. You have trouble coughing up the mucus. Your cough keeps you awake at night. You have a fever. Get help right away if: You cough up blood. You have chest pain. You have very bad shortness of breath. You faint or keep feeling like you are going to faint. You have a very bad headache. Your fever or chills get worse. These symptoms may be an emergency. Get help right away. Call your local emergency services (911 in the [place] Do not wait to see if the symptoms will go away. Do not drive yourself to the hospital. Summary Acute bronchitis is when air tubes in the lungs (bronchi) suddenly get swollen. In adults, acute bronchitis usually goes away within 2 weeks. Drink more fluids. This will help thin your mucus so it is easier to cough up. Take over-the-counter and prescription medicines only as told by your doctor. Contact a doctor if your symptoms do not improve after 2 weeks of treatment. This information is not intended to  replace advice given to you by your health care provider. Make sure you discuss any questions you have with your health care provider. Document Revised: 09/27/2020 Document Reviewed: 09/27/2020 Elsevier Patient Education  2024 Elsevier Inc.    Maryagnes Small, MD [facility]

## 2023-10-23 ENCOUNTER — Other Ambulatory Visit (HOSPITAL_COMMUNITY): Payer: Self-pay

## 2023-10-23 ENCOUNTER — Telehealth: Payer: Self-pay

## 2023-10-23 NOTE — Telephone Encounter (Signed)
 Pharmacy Patient Advocate Encounter   Received notification from CoverMyMeds that prior authorization for HYDROcodone  Bit-Homatrop MBr 5-1.5MG /5ML solution is required/requested.   Insurance verification completed.   The patient is insured through [facility] .   Per test claim: PA required; PA started via CoverMyMeds. KEY [id number] . PT MUST PAY OUT POCKET DRUG NOT COVERED UNDER PART D BY LAW TEST CLAIM COPAY IS 33.30 MAY BE COVERED WITH DISCOUNT CARD. PER PHARMACY.

## 2023-10-24 ENCOUNTER — Other Ambulatory Visit: Payer: Self-pay | Admitting: Internal Medicine

## 2023-11-25 DIAGNOSIS — M25512 Pain in left shoulder: Secondary | ICD-10-CM | POA: Diagnosis not present

## 2024-01-12 DIAGNOSIS — K080 Exfoliation of teeth due to systemic causes: Secondary | ICD-10-CM | POA: Diagnosis not present

## 2024-02-10 ENCOUNTER — Other Ambulatory Visit: Payer: Self-pay | Admitting: Internal Medicine

## 2024-02-13 ENCOUNTER — Telehealth: Payer: Self-pay

## 2024-02-13 NOTE — Telephone Encounter (Signed)
Spoke with patient, gave verbal understanding.

## 2024-02-13 NOTE — Telephone Encounter (Signed)
 I do not recall writing a letter.  He could probably find/buy this medication through goodrx.com for even a lesser price.  Thanks

## 2024-02-13 NOTE — Telephone Encounter (Signed)
 Copied from CRM [id number]. Topic: Clinical - Prescription Issue >> Feb 12, 2024  2:55 PM Thersia BROCKS wrote: Reason for CRM: Patient needs to leave a message for nurse, pharmacy stated that [facility] is holding the medication dicyclomine  (BENTYL ) 10 MG capsule . Would like for a nurse to give a callback

## 2024-02-13 NOTE — Telephone Encounter (Signed)
 Spoke with Pharmacy, medication is ready for pick up for $25. Called and made patient aware, he does not want to pay for medication. States he thinks this is the medication he had issues with in the past, and PCP sent a letter to [employer] for him. I do not see previous letter, please advise patient is almost out of medication

## 2024-02-16 NOTE — Telephone Encounter (Signed)
 Copied from CRM [id number]. Topic: Clinical - Medication Prior Auth >> Feb 16, 2024 11:03 AM Roselie BROCKS wrote: Reason for CRM: [facility] care called with Prior Authorization for Dicyclomine  10mg , it has been approved for 1 year ,starting 02-16-24  Return phone number is [phone]

## 2024-03-23 ENCOUNTER — Ambulatory Visit: Payer: Self-pay

## 2024-03-23 NOTE — Telephone Encounter (Signed)
 FYI Only or Action Required?: FYI only for provider.  Patient was last seen in primary care on 10/22/2023 by Purcell Emil Schanz, MD.  Called Nurse Triage reporting Laceration - skin flap over wound.  Symptoms began several days ago.  Interventions attempted: OTC medications: Antibiotic ointment, bandaging at night.  Symptoms are: unchanged.  Triage Disposition: See PCP When Office is Open (Within 3 Days)  Patient/caregiver understands and will follow disposition?: Yes - Appt for tomorrow scheduled.                   Copied from CRM #[id number]. Topic: Clinical - Red Word Triage >> Mar 23, 2024  4:41 PM Alfonso HERO wrote: Red Word that prompted transfer to Nurse Triage: cut left forearm and its not healing looking very bad. Reason for Disposition  [1] Last tetanus shot > 5 years ago AND [2] DIRTY cut  Answer Assessment - Initial Assessment Questions 1. APPEARANCE of INJURY: What does the injury look like?      Oozing - not swollen , pink oozing, no 2. ONSET: How long ago did the injury occur?      5 days ago 3. LOCATION: Where is the injury located?      Top 3 inches behind wrist  4. SIZE: How large is the cut?      1.5 round 5. BLEEDING: Is it bleeding now? If Yes, ask: Is it difficult to stop?      no 6. PAIN: Is there any pain? If Yes, ask: How bad is the pain? (Scale 0-10; or none, mild, moderate, severe)     Not unless he hits it. 7. MECHANISM: Tell me how it happened.      Ripped it open on the door jam 8. TETANUS: When was your last tetanus booster?     Unsure  Protocols used: Cuts and Lacerations-A-AH

## 2024-03-24 ENCOUNTER — Encounter: Payer: Self-pay | Admitting: Internal Medicine

## 2024-03-24 ENCOUNTER — Ambulatory Visit: Admitting: Internal Medicine

## 2024-03-24 VITALS — BP 110/56 | HR 66 | Temp 97.7°F | Ht 67.0 in | Wt 151.0 lb

## 2024-03-24 DIAGNOSIS — L089 Local infection of the skin and subcutaneous tissue, unspecified: Secondary | ICD-10-CM | POA: Diagnosis not present

## 2024-03-24 DIAGNOSIS — Z23 Encounter for immunization: Secondary | ICD-10-CM | POA: Diagnosis not present

## 2024-03-24 DIAGNOSIS — S51812A Laceration without foreign body of left forearm, initial encounter: Secondary | ICD-10-CM | POA: Diagnosis not present

## 2024-03-24 DIAGNOSIS — T148XXA Other injury of unspecified body region, initial encounter: Secondary | ICD-10-CM | POA: Diagnosis not present

## 2024-03-24 MED ORDER — SIVEXTRO 200 MG PO TABS
1.0000 | ORAL_TABLET | Freq: Every day | ORAL | 0 refills | Status: AC
Start: 2024-03-24 — End: 2024-03-30

## 2024-03-24 NOTE — Patient Instructions (Signed)
Wound Infection A wound infection happens when germs start to grow in a wound. Infection can cause the wound to break open or get worse. Wound infections need treatment. If a wound infection is not treated, serious problems can happen. These problems could include getting an infection in your blood (septicemia) or bones (osteomyelitis). What are the causes? A wound infection is most often caused by bacteria growing in a wound. Other germs, like yeast and fungi, can also cause wound infections. What increases the risk? The following factors may make you more likely to develop a wound infection: A weak body defense system (immune system). Diabetes. Taking steroid medicines for a long time (chronic use). Smoking. Being an older adult. Obesity. Taking chemotherapy medicines. Poor nutrition. What are the signs or symptoms? Symptoms of a wound infection include: More redness, swelling, or pain at the wound site. More blood or fluid at the wound site. A bad smell coming from a wound or bandage (dressing). Fever. Feeling tired (fatigued). Warmth at or around the wound. Pus at the wound site. How is this diagnosed? A wound infection is diagnosed based on your symptoms, medical history, and physical exam. You may also have a wound culture, blood tests, or both. How is this treated? This condition is usually treated with antibiotics and medicines that lower inflammation. The infection should get better in 24-48 hours after starting antibiotics. After 24-48 hours, redness around the wound should stop spreading. The wound should also be less painful. If the condition is severe, you may need to stay at the hospital and get antibiotics through an IV. Follow these instructions at home: Medicines Take or apply over-the-counter and prescription medicines only as told by your health care provider. If you were prescribed antibiotics, take or apply them as told by your provider. Do not stop using the  antibiotic even if you start to feel better. Wound care  Clean the wound each day or as told by your provider. Wash the wound with mild soap and water. Rinse the wound with water to remove all soap. Pat the wound dry with a clean towel. Do not rub it. Follow instructions from your provider about how to take care of your wound. Make sure you: Wash your hands with soap and water for at least 20 seconds before and after you change your dressing. If soap and water are not available, use hand sanitizer. Change your dressing as told by your provider. Leave stitches (sutures), skin glue, or tape strips in place. These skin closures may need to stay in place for 2 weeks or longer. If tape strip edges start to loosen and curl up, you may trim the loose edges. Do not remove tape strips completely unless your provider tells you to do that. Check your wound every day for signs of infection. Check for: More redness, swelling, or pain. More fluid or blood. Warmth. Pus or a bad smell. General instructions Drink enough fluid to keep your pee (urine) pale yellow. Do not take baths, swim, or use a hot tub until your provider approves. Ask your provider if you may take showers. You may only be allowed to take sponge baths. Raise (elevate) the wound area above the level of your heart while you are sitting or lying down. Do not scratch or pick at the wound. Keep all follow-up visits. These visits help your provider make sure a more serious infection is not developing. Contact a health care provider if: Your infection does not get better in 24-48 hours.  You have signs of infection. You have a fever. Your wound gets larger, turns dark in color, or becomes more painful. You feel generally sick (malaise) with muscle aches and weakness. Your symptoms get worse. You have vomiting or diarrhea that does not stop. Get help right away if: Your wound that was closed breaks open. You see red streaks coming from the  infected area. This information is not intended to replace advice given to you by your health care provider. Make sure you discuss any questions you have with your health care provider. Document Revised: 02/28/2022 Document Reviewed: 02/28/2022 Elsevier Patient Education  2024 [employer].

## 2024-03-24 NOTE — Telephone Encounter (Signed)
 Pt is being seen today with provider

## 2024-03-24 NOTE — Telephone Encounter (Signed)
 Copied from CRM [id number]. Topic: Clinical - Medication Question >> Mar 24, 2024  3:01 PM Lauren C wrote: Reason for CRM: Pt was prescribed sivextro 200mg  today, went to pick it up at the pharmacy and they said it costs $2300 with his insurance. He is requesting a cheaper med to be called in to: [facility] - [place], [place] - [address] [place] [address] Phone: [phone] Fax: [phone]  He wants to know if he can do a dose pack.   Please call the pt once complete  [phone]

## 2024-03-24 NOTE — Progress Notes (Unsigned)
 Subjective:  Patient ID: Edwin Ramirez, male    DOB: 1941-04-09  Age: 83 y.o. MRN: [id number]  CC: Laceration (Left arm. Last Wednesday.  )   HPI Edwin Ramirez presents for a wound ---  Discussed the use of AI scribe software for clinical note transcription with the patient, who gave verbal consent to proceed.  History of Present Illness Edwin Ramirez is an 83 year old male who presents with an infected wound on his left forearm.  He sustained an injury to his left forearm a week ago after accidentally hitting it on a door frame at home while getting a drink at 3 AM. Initially, the wound was dry and covered with skin, but it started draining again this morning with a yellow substance.  His wife has been applying antibiotic ointment, and he has been leaving the bandage off during the day to allow air exposure. He sleeps on his right side to avoid pressure on the wound.  He rates the pain associated with the wound as a 'two or three' on a scale of ten, indicating mild discomfort. No fever or chills are present.  He is unsure about the status of his tetanus vaccination, estimating it has been about eight or nine years since his last shot.     Outpatient Medications Prior to Visit  Medication Sig Dispense Refill   b complex vitamins tablet Take 1 tablet by mouth daily. 100 tablet 3   brimonidine (ALPHAGAN) 0.2 % ophthalmic solution Place 1 drop into both eyes.     BRIMONIDINE TARTRATE OP Apply to eye.     Cholecalciferol (VITAMIN D3) 50 MCG (2000 UT) capsule Take 1 capsule (2,000 Units total) by mouth daily. 100 capsule 3   dicyclomine  (BENTYL ) 10 MG capsule TAKE 1 CAPSULE BY MOUTH 4 TIMES DAILY, BEFORE MEALS AND AT BEDTIME. 100 capsule 1   fluticasone  (FLONASE ) 50 MCG/ACT nasal spray Place 2 sprays into both nostrils daily. 16 g 6   HYDROcodone  bit-homatropine (HYCODAN) 5-1.5 MG/5ML syrup Take 5 mLs by mouth at bedtime as needed for cough. 120 mL 0    latanoprost (XALATAN) 0.005 % ophthalmic solution Place 1 drop into both eyes at bedtime.     LORazepam  (ATIVAN ) 1 MG tablet TAKE 1 TABLET BY MOUTH EVERY 8 HOURS AS NEEDED FOR ANXIETY 90 tablet 2   methocarbamol  (ROBAXIN ) 500 MG tablet Take 1 tablet (500 mg total) by mouth every 8 (eight) hours as needed for muscle spasms. 30 tablet 1   omeprazole  (PRILOSEC) 40 MG capsule TAKE 1 CAPSULE BY MOUTH TWICE A DAY 180 capsule 2   rosuvastatin  (CRESTOR ) 10 MG tablet TAKE 1 TABLET BY MOUTH EVERY DAY 90 tablet 3   traMADol  (ULTRAM ) 50 MG tablet Take 1 tablet (50 mg total) by mouth every 6 (six) hours as needed for severe pain. 20 tablet 0   traZODone  (DESYREL ) 50 MG tablet TAKE 1 TO 2 TABLETS BY MOUTH AT BEDTIME AS NEEDED FOR SLEEP 60 tablet 5   azithromycin  (ZITHROMAX ) 250 MG tablet Sig as indicated 6 tablet 0   benzonatate  (TESSALON ) 200 MG capsule Take 1 capsule (200 mg total) by mouth 2 (two) times daily as needed for cough. 20 capsule 0   methylPREDNISolone  (MEDROL  DOSEPAK) 4 MG TBPK tablet As directed (Patient not taking: Reported on 10/22/2023) 21 tablet 0   pseudoephedrine  (SUDAFED) 120 MG 12 hr tablet Take 1 tablet (120 mg total) by mouth 2 (two) times daily as needed for  congestion. (Patient not taking: Reported on 10/22/2023) 30 tablet 1   No facility-administered medications prior to visit.    ROS Review of Systems  Objective:  BP (!) 110/56 (BP Location: Left Arm, Patient Position: Sitting, Cuff Size: Normal)   Pulse 66   Temp 97.7 F (36.5 C) (Oral)   Ht 5' 7 (1.702 m)   Wt 151 lb (68.5 kg)   SpO2 97%   BMI 23.65 kg/m   BP Readings from Last 3 Encounters:  03/24/24 (!) 110/56  10/22/23 112/60  09/30/23 (!) 90/52    Wt Readings from Last 3 Encounters:  03/24/24 151 lb (68.5 kg)  10/22/23 152 lb (68.9 kg)  09/30/23 156 lb 9.6 oz (71 kg)    Physical Exam  Lab Results  Component Value Date   WBC 4.7 03/24/2023   HGB 13.2 03/24/2023   HCT 40.6 03/24/2023   PLT 149.0 (L)  03/24/2023   GLUCOSE 91 03/24/2023   CHOL 128 03/24/2023   TRIG 114.0 03/24/2023   HDL 42.80 03/24/2023   LDLDIRECT 78.8 02/05/2011   LDLCALC 62 03/24/2023   ALT 17 03/24/2023   AST 21 03/24/2023   NA 140 03/24/2023   K 4.1 03/24/2023   CL 106 03/24/2023   CREATININE 1.09 03/24/2023   BUN 17 03/24/2023   CO2 29 03/24/2023   TSH 2.36 03/24/2023   PSA 0.00 (L) 03/24/2023   HGBA1C 5.7 03/07/2014    No results found.  Assessment & Plan:  Need for immunization against influenza -     Flu vaccine HIGH DOSE PF(Fluzone Trivalent)  Laceration of left forearm, initial encounter  Wound infection, posttraumatic -     Sivextro; Take 1 tablet (200 mg total) by mouth daily for 6 days.  Dispense: 6 tablet; Refill: 0     Follow-up: No follow-ups on file.  Debby Molt, MD

## 2024-03-25 ENCOUNTER — Telehealth: Payer: Self-pay | Admitting: Internal Medicine

## 2024-03-25 NOTE — Telephone Encounter (Signed)
 PT saw Edwin Ramirez 03/24/24 states one of the medications is too expensive and would like to discuss switching to a generic.  Please reach out on mychart or call back at [phone]

## 2024-03-26 ENCOUNTER — Telehealth: Payer: Self-pay

## 2024-03-26 ENCOUNTER — Other Ambulatory Visit: Payer: Self-pay | Admitting: Internal Medicine

## 2024-03-26 MED ORDER — DOXYCYCLINE HYCLATE 100 MG PO TABS: 100.0000 mg | ORAL_TABLET | Freq: Two times a day (BID) | ORAL | 0 refills | Status: DC

## 2024-03-26 MED ORDER — MUPIROCIN 2 % EX OINT: TOPICAL_OINTMENT | CUTANEOUS | 0 refills | Status: AC

## 2024-03-26 NOTE — Telephone Encounter (Signed)
 Copied from CRM [id number]. Topic: General - Call Back - No Documentation >> Mar 26, 2024 10:53 AM Viola F wrote: Reason for CRM: Patient returning a call from the office but there is no documentation, he says it may be regarding the Tedizolid Phosphate (SIVEXTRO) 200 MG TABS medication

## 2024-03-26 NOTE — Telephone Encounter (Signed)
 Provider has stated If Sivextro is not covered use doxycycline  orally and Bactroban topically.  See me in 1 week.  Thanks

## 2024-03-26 NOTE — Telephone Encounter (Signed)
 If Sivextro is not covered use doxycycline  orally and Bactroban topically.  See me in 1 week.  Thanks

## 2024-03-26 NOTE — Telephone Encounter (Signed)
 Addressed in the previous message.  Thanks

## 2024-03-27 ENCOUNTER — Ambulatory Visit: Payer: Self-pay | Admitting: Internal Medicine

## 2024-03-27 LAB — WOUND CULTURE

## 2024-03-29 ENCOUNTER — Ambulatory Visit (INDEPENDENT_AMBULATORY_CARE_PROVIDER_SITE_OTHER): Payer: Medicare Other

## 2024-03-29 VITALS — Ht 66.5 in | Wt 151.0 lb

## 2024-03-29 DIAGNOSIS — Z0000 Encounter for general adult medical examination without abnormal findings: Secondary | ICD-10-CM

## 2024-03-29 NOTE — Patient Instructions (Addendum)
 Mr. Edwin Ramirez,  Thank you for taking the time for your Medicare Wellness Visit. I appreciate your continued commitment to your health goals. Please review the care plan we discussed, and feel free to reach out if I can assist you further.  Medicare recommends these wellness visits once per year to help you and your care team stay ahead of potential health issues. These visits are designed to focus on prevention, allowing your provider to concentrate on managing your acute and chronic conditions during your regular appointments.  Please note that Annual Wellness Visits do not include a physical exam. Some assessments may be limited, especially if the visit was conducted virtually. If needed, we may recommend a separate in-person follow-up with your provider.  Ongoing Care Seeing your primary care provider every 3 to 6 months helps us  monitor your health and provide consistent, personalized care.   Referrals If a referral was made during today's visit and you haven't received any updates within two weeks, please contact the referred provider directly to check on the status.  Recommended Screenings:  Health Maintenance  Topic Date Due   COVID-19 Vaccine (6 - 2025-26 season) 02/09/2024   Medicare Annual Wellness Visit  03/29/2025   DTaP/Tdap/Td vaccine (4 - Td or Tdap) 03/24/2034   Pneumococcal Vaccine for age over 42  Completed   Flu Shot  Completed   Zoster (Shingles) Vaccine  Completed   Meningitis B Vaccine  Aged Out       03/27/2023    1:39 PM  Advanced Directives  Does Patient Have a Medical Advance Directive? Yes  Type of Estate agent of [place];Living will  Copy of Healthcare Power of Attorney in Chart? No - copy requested   Advance Care Planning is important because it: Ensures you receive medical care that aligns with your values, goals, and preferences. Provides guidance to your family and loved ones, reducing the emotional burden of  decision-making during critical moments.  Vision: Annual vision screenings are recommended for early detection of glaucoma, cataracts, and diabetic retinopathy. These exams can also reveal signs of chronic conditions such as diabetes and high blood pressure.  Dental: Annual dental screenings help detect early signs of oral cancer, gum disease, and other conditions linked to overall health, including heart disease and diabetes.

## 2024-03-29 NOTE — Progress Notes (Signed)
 Subjective:  Please attest and cosign this visit due to patients primary care provider not being in the office at the time the visit was completed.  (Pt of Dr A. Plotnikov)   Edwin Ramirez is a 83 y.o. who presents for a Medicare Wellness preventive visit.  As a reminder, Annual Wellness Visits don't include a physical exam, and some assessments may be limited, especially if this visit is performed virtually. We may recommend an in-person follow-up visit with your provider if needed.  Visit Complete: Virtual I connected with  Edwin Ramirez on 03/29/24 by a audio enabled telemedicine application and verified that I am speaking with the correct person using two identifiers.  Patient Location: Home  Provider Location: Office/Clinic  I discussed the limitations of evaluation and management by telemedicine. The patient expressed understanding and agreed to proceed.  Vital Signs: Because this visit was a virtual/telehealth visit, some criteria may be missing or patient reported. Any vitals not documented were not able to be obtained and vitals that have been documented are patient reported.  VideoDeclined- This patient declined Librarian, academic. Therefore the visit was completed with audio only.  Persons Participating in Visit: Patient.  AWV Questionnaire: No: Patient Medicare AWV questionnaire was not completed prior to this visit.  Cardiac Risk Factors include: advanced age (>7men, >64 women);dyslipidemia;male gender;Other (see comment), Risk factor comments: CAD     Objective:    Today's Vitals   03/29/24 1137  Weight: 151 lb (68.5 kg)  Height: 5' 6.5 (1.689 m)   Body mass index is 24.01 kg/m.     03/27/2023    1:39 PM 04/05/2019   12:05 PM 01/06/2016   12:22 PM  Advanced Directives  Does Patient Have a Medical Advance Directive? Yes Yes No   Type of Estate agent of [place];Living will Healthcare Power of  [place];Living will   Copy of Healthcare Power of Attorney in Chart? No - copy requested No - copy requested   Would patient like information on creating a medical advance directive?   No - patient declined information      Data saved with a previous flowsheet row definition    Current Medications (verified) Outpatient Encounter Medications as of 03/29/2024  Medication Sig   b complex vitamins tablet Take 1 tablet by mouth daily.   brimonidine (ALPHAGAN) 0.2 % ophthalmic solution Place 1 drop into both eyes.   BRIMONIDINE TARTRATE OP Apply to eye.   Cholecalciferol (VITAMIN D3) 50 MCG (2000 UT) capsule Take 1 capsule (2,000 Units total) by mouth daily.   dicyclomine  (BENTYL ) 10 MG capsule TAKE 1 CAPSULE BY MOUTH 4 TIMES DAILY, BEFORE MEALS AND AT BEDTIME.   doxycycline  (VIBRA -TABS) 100 MG tablet Take 1 tablet (100 mg total) by mouth 2 (two) times daily.   fluticasone  (FLONASE ) 50 MCG/ACT nasal spray Place 2 sprays into both nostrils daily.   HYDROcodone  bit-homatropine (HYCODAN) 5-1.5 MG/5ML syrup Take 5 mLs by mouth at bedtime as needed for cough.   latanoprost (XALATAN) 0.005 % ophthalmic solution Place 1 drop into both eyes at bedtime.   LORazepam  (ATIVAN ) 1 MG tablet TAKE 1 TABLET BY MOUTH EVERY 8 HOURS AS NEEDED FOR ANXIETY   methocarbamol  (ROBAXIN ) 500 MG tablet Take 1 tablet (500 mg total) by mouth every 8 (eight) hours as needed for muscle spasms.   mupirocin ointment (BACTROBAN) 2 % On wound w/dressing change bid   omeprazole  (PRILOSEC) 40 MG capsule TAKE 1 CAPSULE BY MOUTH TWICE A  DAY   rosuvastatin  (CRESTOR ) 10 MG tablet TAKE 1 TABLET BY MOUTH EVERY DAY   Tedizolid Phosphate (SIVEXTRO) 200 MG TABS Take 1 tablet (200 mg total) by mouth daily for 6 days.   traZODone  (DESYREL ) 50 MG tablet TAKE 1 TO 2 TABLETS BY MOUTH AT BEDTIME AS NEEDED FOR SLEEP   traMADol  (ULTRAM ) 50 MG tablet Take 1 tablet (50 mg total) by mouth every 6 (six) hours as needed for severe pain. (Patient not  taking: Reported on 03/29/2024)   No facility-administered encounter medications on file as of 03/29/2024.    Allergies (verified) Codeine, Nabumetone, Pneumovax [pneumococcal polysaccharide vaccine], Prevnar 13 [pneumococcal 13-val conj vacc], and Aspirin    History: Past Medical History:  Diagnosis Date   Basal cell cancer    Dr Cary   DDD (degenerative disc disease)    Diverticulosis    Esophageal stricture    Dr Obie   Gastritis    GERD (gastroesophageal reflux disease)    Bertrum syndrome    Glaucoma    Hepatic cyst    History of prostate cancer    Dr Renda   Hx of adenomatous colonic polyps    Hyperlipidemia    has had good cholesterol count for years.   Internal hemorrhoids    Pulmonary nodule    not seen on F/U 2014   TMJ (dislocation of temporomandibular joint)    Past Surgical History:  Procedure Laterality Date   APPENDECTOMY     cataract surgery  2012   bilateral   CHOLECYSTECTOMY  1985   COLONOSCOPY W/ POLYPECTOMY  2004   Negative 2009 & 2013 Dr.Brodie   ESOPHAGEAL DILATION  2004   MOHS SURGERY     Basal Cell   PROSTATECTOMY  07/2005   Dr.Borden   ROTATOR CUFF REPAIR      X 2-Left; Dr Beverley   Family History  Problem Relation Age of Onset   Multiple myeloma Father    Colon cancer Mother 41   Heart failure Mother    Heart attack Maternal Uncle 57   Diabetes Neg Hx    Stroke Neg Hx    Esophageal cancer Neg Hx    Rectal cancer Neg Hx    Stomach cancer Neg Hx    Social History   Socioeconomic History   Marital status: Married    Spouse name: Not on file   Number of children: 2   Years of education: Not on file   Highest education level: Not on file  Occupational History   Occupation: retired   Occupation: retired  Tobacco Use   Smoking status: Never   Smokeless tobacco: Never  Vaping Use   Vaping status: Never Used  Substance and Sexual Activity   Alcohol use: Yes    Alcohol/week: 5.0 standard drinks of alcohol    Types: 5  Glasses of wine per week    Comment: Socially   Drug use: No   Sexual activity: Not Currently  Other Topics Concern   Not on file  Social History Narrative   DAILY CAFFEINE   Social Drivers of Health   Financial Resource Strain: Low Risk  (03/29/2024)   Overall Financial Resource Strain (CARDIA)    Difficulty of Paying Living Expenses: Not hard at all  Food Insecurity: No Food Insecurity (03/29/2024)   Hunger Vital Sign    Worried About Running Out of Food in the Last Year: Never true    Ran Out of Food in the Last Year: Never true  Transportation  Needs: No Transportation Needs (03/29/2024)   PRAPARE - Administrator, Civil Service (Medical): No    Lack of Transportation (Non-Medical): No  Physical Activity: Sufficiently Active (03/29/2024)   Exercise Vital Sign    Days of Exercise per Week: 5 days    Minutes of Exercise per Session: 60 min  Stress: No Stress Concern Present (03/29/2024)   [employer] of Occupational Health - Occupational Stress Questionnaire    Feeling of Stress: Not at all  Social Connections: Socially Integrated (03/29/2024)   Social Connection and Isolation Panel    Frequency of Communication with Friends and Family: More than three times a week    Frequency of Social Gatherings with Friends and Family: More than three times a week    Attends Religious Services: More than 4 times per year    Active Member of [employer] or Organizations: Yes    Attends Engineer, structural: More than 4 times per year    Marital Status: Married    Tobacco Counseling Counseling given: Not Answered    Clinical Intake:  Pre-visit preparation completed: Yes  Pain : No/denies pain     BMI - recorded: 24.01 Nutritional Status: BMI of 19-24  Normal Nutritional Risks: None Diabetes: No  Lab Results  Component Value Date   HGBA1C 5.7 03/07/2014     How often do you need to have someone help you when you read instructions, pamphlets, or other  written materials from your doctor or pharmacy?: 1 - Never  Interpreter Needed?: No  Information entered by :: Verdie Saba, CMA   Activities of Daily Living     03/29/2024   11:40 AM  In your present state of health, do you have any difficulty performing the following activities:  Hearing? 0  Vision? 0  Difficulty concentrating or making decisions? 0  Walking or climbing stairs? 0  Dressing or bathing? 0  Doing errands, shopping? 0  Preparing Food and eating ? N  Using the Toilet? N  In the past six months, have you accidently leaked urine? N  Do you have problems with loss of bowel control? N  Managing your Medications? N  Managing your Finances? N  Housekeeping or managing your Housekeeping? N    Patient Care Team: Plotnikov, Karlynn GAILS, MD as PCP - General (Internal Medicine) Renda Glance, MD as Consulting Physician (Urology) Legrand Victory LITTIE MOULD, MD as Consulting Physician (Gastroenterology) Octavia Bruckner, MD as Consulting Physician (Ophthalmology)  I have updated your Care Teams any recent Medical Services you may have received from other providers in the past year.     Assessment:   This is a routine wellness examination for Edwin Ramirez.  Hearing/Vision screen Hearing Screening - Comments:: Wears hearing aids Vision Screening - Comments:: Denies vision concerns   Goals Addressed               This Visit's Progress     Patient Stated (pt-stated)        Patient stated he plans to continue exercising - walking       Depression Screen     03/29/2024   11:42 AM 10/22/2023    8:46 AM 03/27/2023    1:40 PM 03/24/2023    9:15 AM 11/13/2022    7:58 AM 09/23/2022    9:36 AM 08/06/2022   10:28 AM  PHQ 2/9 Scores  PHQ - 2 Score 0 0 0 0 0 0 0  PHQ- 9 Score 0  0  Fall Risk     03/29/2024   11:41 AM 10/22/2023    8:45 AM 03/27/2023    1:40 PM 03/24/2023    9:15 AM 11/13/2022    7:58 AM  Fall Risk   Falls in the past year? 0 0 0 0 0  Number falls  in past yr: 0 0 0 0 0  Injury with Fall? 0 0 0 0 0  Risk for fall due to : No Fall Risks No Fall Risks No Fall Risks No Fall Risks No Fall Risks  Follow up Falls evaluation completed;Falls prevention discussed Falls evaluation completed Falls prevention discussed Falls evaluation completed Falls evaluation completed    MEDICARE RISK AT HOME:  Medicare Risk at Home Any stairs in or around the home?: Yes If so, are there any without handrails?: No Home free of loose throw rugs in walkways, pet beds, electrical cords, etc?: Yes Adequate lighting in your home to reduce risk of falls?: Yes Life alert?: No Use of a cane, walker or w/c?: No Grab bars in the bathroom?: No Shower chair or bench in shower?: Yes Elevated toilet seat or a handicapped toilet?: Yes  TIMED UP AND GO:  Was the test performed?  No  Cognitive Function: 6CIT completed        03/29/2024   11:44 AM 03/27/2023    1:42 PM 04/05/2019   12:07 PM  6CIT Screen  What Year? 0 points 0 points 0 points  What month? 0 points 0 points 0 points  What time? 0 points 0 points 0 points  Count back from 20 0 points 0 points 0 points  Months in reverse 0 points 0 points 0 points  Repeat phrase 0 points 0 points 0 points  Total Score 0 points 0 points 0 points    Immunizations Immunization History  Administered Date(s) Administered   Fluad Quad(high Dose 65+) 03/15/2019, 03/19/2021, 03/20/2022   Fluad Trivalent(High Dose 65+) 03/24/2023   INFLUENZA, HIGH DOSE SEASONAL PF 03/25/2013, 03/18/2017, 03/11/2018, 04/10/2020, 03/24/2024   Influenza Split 03/28/2011, 03/05/2012   Influenza Whole 03/16/2008, 04/06/2009, 02/08/2010   Influenza,inj,Quad PF,6+ Mos 03/17/2014, 03/01/2015, 03/01/2016   PFIZER Comirnaty(Gray Top)Covid-19 Tri-Sucrose Vaccine 10/10/2020   PFIZER(Purple Top)SARS-COV-2 Vaccination 06/30/2019, 07/21/2019, 03/14/2020   Pfizer Covid-19 Vaccine Bivalent Booster 6yrs & up 06/14/2021   Pneumococcal Conjugate-13  02/25/2014   Pneumococcal Polysaccharide-23 01/19/2010, 03/01/2016   Td 12/08/2006, 03/24/2024   Tdap 03/11/2018   Zoster Recombinant(Shingrix) 10/12/2017, 12/30/2017   Zoster, Live 04/21/2007    Screening Tests Health Maintenance  Topic Date Due   COVID-19 Vaccine (6 - 2025-26 season) 02/09/2024   Medicare Annual Wellness (AWV)  03/29/2025   DTaP/Tdap/Td (4 - Td or Tdap) 03/24/2034   Pneumococcal Vaccine: 50+ Years  Completed   Influenza Vaccine  Completed   Zoster Vaccines- Shingrix  Completed   Meningococcal B Vaccine  Aged Out    Health Maintenance Items Addressed:  03/29/2024  Additional Screening:  Vision Screening: Recommended annual ophthalmology exams for early detection of glaucoma and other disorders of the eye. Is the patient up to date with their annual eye exam?  Yes  Who is the provider or what is the name of the office in which the patient attends annual eye exams? Lonni Gaudy of [facility]  Dental Screening: Recommended annual dental exams for proper oral hygiene  Community Resource Referral / Chronic Care Management: CRR required this visit?  No   CCM required this visit?  No   Plan:  I have personally reviewed and noted the following in the patient's chart:   Medical and social history Use of alcohol, tobacco or illicit drugs  Current medications and supplements including opioid prescriptions. Patient is not currently taking opioid prescriptions. Functional ability and status Nutritional status Physical activity Advanced directives List of other physicians Hospitalizations, surgeries, and ER visits in previous 12 months Vitals Screenings to include cognitive, depression, and falls Referrals and appointments  In addition, I have reviewed and discussed with patient certain preventive protocols, quality metrics, and best practice recommendations. A written personalized care plan for preventive services as well as general preventive  health recommendations were provided to patient.   Verdie CHRISTELLA Saba, CMA   03/29/2024   After Visit Summary: (MyChart) Due to this being a telephonic visit, the after visit summary with patients personalized plan was offered to patient via MyChart   Notes: Scheduled a Physical w/PCP for 04/01/2024

## 2024-04-01 ENCOUNTER — Encounter: Payer: Self-pay | Admitting: Internal Medicine

## 2024-04-01 ENCOUNTER — Ambulatory Visit (INDEPENDENT_AMBULATORY_CARE_PROVIDER_SITE_OTHER): Admitting: Internal Medicine

## 2024-04-01 VITALS — BP 110/60 | HR 63 | Temp 98.1°F | Ht 66.5 in | Wt 150.4 lb

## 2024-04-01 DIAGNOSIS — Z0000 Encounter for general adult medical examination without abnormal findings: Secondary | ICD-10-CM

## 2024-04-01 DIAGNOSIS — I2583 Coronary atherosclerosis due to lipid rich plaque: Secondary | ICD-10-CM | POA: Diagnosis not present

## 2024-04-01 DIAGNOSIS — I2510 Atherosclerotic heart disease of native coronary artery without angina pectoris: Secondary | ICD-10-CM

## 2024-04-01 DIAGNOSIS — E785 Hyperlipidemia, unspecified: Secondary | ICD-10-CM | POA: Diagnosis not present

## 2024-04-01 MED ORDER — LORAZEPAM 1 MG PO TABS: 1.0000 mg | ORAL_TABLET | Freq: Three times a day (TID) | ORAL | 2 refills | Status: AC | PRN

## 2024-04-01 NOTE — Assessment & Plan Note (Signed)
 On Crestor 

## 2024-04-01 NOTE — Assessment & Plan Note (Signed)
No NSAIDs for ear pain

## 2024-04-01 NOTE — Progress Notes (Addendum)
 Subjective:  Patient ID: Edwin Ramirez, male    DOB: Jul 12, 1940  Age: 83 y.o. MRN: [id number]  CC: Medical Management of Chronic Issues (Annual Exam)   HPI Edwin Ramirez presents for a well exam F/u on wound, anxiety, CAD/dyslipidemia  Outpatient Medications Prior to Visit  Medication Sig Dispense Refill   b complex vitamins tablet Take 1 tablet by mouth daily. 100 tablet 3   brimonidine (ALPHAGAN) 0.2 % ophthalmic solution Place 1 drop into both eyes.     BRIMONIDINE TARTRATE OP Apply to eye.     Cholecalciferol (VITAMIN D3) 50 MCG (2000 UT) capsule Take 1 capsule (2,000 Units total) by mouth daily. 100 capsule 3   dicyclomine  (BENTYL ) 10 MG capsule TAKE 1 CAPSULE BY MOUTH 4 TIMES DAILY, BEFORE MEALS AND AT BEDTIME. 100 capsule 1   doxycycline  (VIBRA -TABS) 100 MG tablet Take 1 tablet (100 mg total) by mouth 2 (two) times daily. 20 tablet 0   fluticasone  (FLONASE ) 50 MCG/ACT nasal spray Place 2 sprays into both nostrils daily. 16 g 6   HYDROcodone  bit-homatropine (HYCODAN) 5-1.5 MG/5ML syrup Take 5 mLs by mouth at bedtime as needed for cough. 120 mL 0   latanoprost (XALATAN) 0.005 % ophthalmic solution Place 1 drop into both eyes at bedtime.     methocarbamol  (ROBAXIN ) 500 MG tablet Take 1 tablet (500 mg total) by mouth every 8 (eight) hours as needed for muscle spasms. 30 tablet 1   mupirocin ointment (BACTROBAN) 2 % On wound w/dressing change bid 30 g 0   omeprazole  (PRILOSEC) 40 MG capsule TAKE 1 CAPSULE BY MOUTH TWICE A DAY 180 capsule 2   rosuvastatin  (CRESTOR ) 10 MG tablet TAKE 1 TABLET BY MOUTH EVERY DAY 90 tablet 3   traZODone  (DESYREL ) 50 MG tablet TAKE 1 TO 2 TABLETS BY MOUTH AT BEDTIME AS NEEDED FOR SLEEP 60 tablet 5   LORazepam  (ATIVAN ) 1 MG tablet TAKE 1 TABLET BY MOUTH EVERY 8 HOURS AS NEEDED FOR ANXIETY 90 tablet 2   traMADol  (ULTRAM ) 50 MG tablet Take 1 tablet (50 mg total) by mouth every 6 (six) hours as needed for severe pain. (Patient not taking: Reported  on 04/01/2024) 20 tablet 0   No facility-administered medications prior to visit.    ROS: Review of Systems  Constitutional:  Negative for appetite change, fatigue and unexpected weight change.  HENT:  Negative for congestion, nosebleeds, sneezing, sore throat and trouble swallowing.   Eyes:  Negative for itching and visual disturbance.  Respiratory:  Negative for cough.   Cardiovascular:  Negative for chest pain, palpitations and leg swelling.  Gastrointestinal:  Negative for abdominal distention, blood in stool, diarrhea and nausea.  Genitourinary:  Negative for frequency and hematuria.  Musculoskeletal:  Negative for back pain, gait problem, joint swelling and neck pain.  Skin:  Negative for rash.  Neurological:  Negative for dizziness, tremors, speech difficulty and weakness.  Psychiatric/Behavioral:  Negative for agitation, dysphoric mood and sleep disturbance. The patient is not nervous/anxious.     Objective:  BP 110/60   Pulse 63   Temp 98.1 F (36.7 C)   Ht 5' 6.5 (1.689 m)   Wt 150 lb 6.4 oz (68.2 kg)   SpO2 98%   BMI 23.91 kg/m   BP Readings from Last 3 Encounters:  04/01/24 110/60  03/24/24 (!) 110/56  10/22/23 112/60    Wt Readings from Last 3 Encounters:  04/01/24 150 lb 6.4 oz (68.2 kg)  03/29/24 151 lb (68.5 kg)  03/24/24 151 lb (68.5 kg)    Physical Exam Constitutional:      General: He is not in acute distress.    Appearance: Normal appearance. He is well-developed.     Comments: NAD  Eyes:     Conjunctiva/sclera: Conjunctivae normal.     Pupils: Pupils are equal, round, and reactive to light.  Neck:     Thyroid : No thyromegaly.     Vascular: No JVD.  Cardiovascular:     Rate and Rhythm: Normal rate and regular rhythm.     Heart sounds: Normal heart sounds. No murmur heard.    No friction rub. No gallop.  Pulmonary:     Effort: Pulmonary effort is normal. No respiratory distress.     Breath sounds: Normal breath sounds. No wheezing or  rales.  Chest:     Chest wall: No tenderness.  Abdominal:     General: Bowel sounds are normal. There is no distension.     Palpations: Abdomen is soft. There is no mass.     Tenderness: There is no abdominal tenderness. There is no guarding or rebound.  Musculoskeletal:        General: No tenderness. Normal range of motion.     Cervical back: Normal range of motion.  Lymphadenopathy:     Cervical: No cervical adenopathy.  Skin:    General: Skin is warm and dry.     Findings: No rash.  Neurological:     Mental Status: He is alert and oriented to person, place, and time.     Cranial Nerves: No cranial nerve deficit.     Motor: No abnormal muscle tone.     Coordination: Coordination normal.     Gait: Gait normal.     Deep Tendon Reflexes: Reflexes are normal and symmetric. Reflexes normal.  Psychiatric:        Behavior: Behavior normal.        Thought Content: Thought content normal.        Judgment: Judgment normal.   Good scab on L forearm Rectal - per GI  Lab Results  Component Value Date   WBC 4.7 03/24/2023   HGB 13.2 03/24/2023   HCT 40.6 03/24/2023   PLT 149.0 (L) 03/24/2023   GLUCOSE 91 03/24/2023   CHOL 128 03/24/2023   TRIG 114.0 03/24/2023   HDL 42.80 03/24/2023   LDLDIRECT 78.8 02/05/2011   LDLCALC 62 03/24/2023   ALT 17 03/24/2023   AST 21 03/24/2023   NA 140 03/24/2023   K 4.1 03/24/2023   CL 106 03/24/2023   CREATININE 1.09 03/24/2023   BUN 17 03/24/2023   CO2 29 03/24/2023   TSH 2.36 03/24/2023   PSA 0.00 (L) 03/24/2023   HGBA1C 5.7 03/07/2014    No results found.  Assessment & Plan:   Problem List Items Addressed This Visit     CAD (coronary artery disease) - Primary   No NSAIDs for ear pain      Dyslipidemia   On Crestor       Well adult exam   We discussed age appropriate health related issues, including available/recomended screening tests and vaccinations. Labs were ordered to be later reviewed . All questions were answered. We  discussed one or more of the following - seat belt use, use of sunscreen/sun exposure exercise, fall risk reduction, second hand smoke exposure, firearm use and storage, seat belt use, a need for adhering to healthy diet and exercise. Labs were ordered.  All questions were answered.  Last  Colon 2024      Relevant Orders   TSH   Urinalysis   CBC with Differential/Platelet   Lipid panel   PSA   Comprehensive metabolic panel with GFR      Meds ordered this encounter  Medications   LORazepam  (ATIVAN ) 1 MG tablet    Sig: Take 1 tablet (1 mg total) by mouth every 8 (eight) hours as needed. for anxiety    Dispense:  90 tablet    Refill:  2      Follow-up: Return in about 6 months (around 09/30/2024) for a follow-up visit.  Marolyn Noel, MD

## 2024-04-01 NOTE — Assessment & Plan Note (Signed)

## 2024-04-02 ENCOUNTER — Other Ambulatory Visit (INDEPENDENT_AMBULATORY_CARE_PROVIDER_SITE_OTHER)

## 2024-04-02 DIAGNOSIS — Z125 Encounter for screening for malignant neoplasm of prostate: Secondary | ICD-10-CM

## 2024-04-02 DIAGNOSIS — Z13220 Encounter for screening for lipoid disorders: Secondary | ICD-10-CM | POA: Diagnosis not present

## 2024-04-02 DIAGNOSIS — Z0000 Encounter for general adult medical examination without abnormal findings: Secondary | ICD-10-CM | POA: Diagnosis not present

## 2024-04-02 LAB — CBC WITH DIFFERENTIAL/PLATELET
Basophils Absolute: 0 K/uL (ref 0.0–0.1)
Basophils Relative: 0.9 % (ref 0.0–3.0)
Eosinophils Absolute: 0.1 K/uL (ref 0.0–0.7)
Eosinophils Relative: 3.4 % (ref 0.0–5.0)
HCT: 37.7 % — ABNORMAL LOW (ref 39.0–52.0)
Hemoglobin: 12.4 g/dL — ABNORMAL LOW (ref 13.0–17.0)
Lymphocytes Relative: 29.4 % (ref 12.0–46.0)
Lymphs Abs: 1.1 K/uL (ref 0.7–4.0)
MCHC: 32.8 g/dL (ref 30.0–36.0)
MCV: 99.7 fl (ref 78.0–100.0)
Monocytes Absolute: 0.4 K/uL (ref 0.1–1.0)
Monocytes Relative: 10.6 % (ref 3.0–12.0)
Neutro Abs: 2.2 K/uL (ref 1.4–7.7)
Neutrophils Relative %: 55.7 % (ref 43.0–77.0)
Platelets: 151 K/uL (ref 150.0–400.0)
RBC: 3.78 Mil/uL — ABNORMAL LOW (ref 4.22–5.81)
RDW: 12.7 % (ref 11.5–15.5)
WBC: 3.9 K/uL — ABNORMAL LOW (ref 4.0–10.5)

## 2024-04-02 LAB — COMPREHENSIVE METABOLIC PANEL WITH GFR
ALT: 18 U/L (ref 0–53)
AST: 21 U/L (ref 0–37)
Albumin: 4.1 g/dL (ref 3.5–5.2)
Alkaline Phosphatase: 57 U/L (ref 39–117)
BUN: 19 mg/dL (ref 6–23)
CO2: 28 meq/L (ref 19–32)
Calcium: 8.9 mg/dL (ref 8.4–10.5)
Chloride: 105 meq/L (ref 96–112)
Creatinine, Ser: 1.17 mg/dL (ref 0.40–1.50)
GFR: 56.86 mL/min — ABNORMAL LOW (ref >60.00)
Glucose, Bld: 88 mg/dL (ref 70–99)
Potassium: 4.1 meq/L (ref 3.5–5.1)
Sodium: 142 meq/L (ref 135–145)
Total Bilirubin: 0.9 mg/dL (ref 0.2–1.2)
Total Protein: 6.2 g/dL (ref 6.0–8.3)

## 2024-04-02 LAB — URINALYSIS
Bilirubin Urine: NEGATIVE
Hgb urine dipstick: NEGATIVE
Ketones, ur: NEGATIVE
Leukocytes,Ua: NEGATIVE
Nitrite: NEGATIVE
Specific Gravity, Urine: 1.02 (ref 1.000–1.030)
Total Protein, Urine: NEGATIVE
Urine Glucose: NEGATIVE
Urobilinogen, UA: 0.2 (ref 0.0–1.0)
pH: 6 (ref 5.0–8.0)

## 2024-04-02 LAB — LIPID PANEL
Cholesterol: 126 mg/dL (ref 0–200)
HDL: 42.7 mg/dL (ref >39.00)
LDL Cholesterol: 62 mg/dL (ref 0–99)
NonHDL: 82.93
Total CHOL/HDL Ratio: 3
Triglycerides: 107 mg/dL (ref 0.0–149.0)
VLDL: 21.4 mg/dL (ref 0.0–40.0)

## 2024-04-02 LAB — PSA: PSA: 0 ng/mL — ABNORMAL LOW (ref 0.10–4.00)

## 2024-04-02 LAB — TSH: TSH: 1.74 u[IU]/mL (ref 0.35–5.50)

## 2024-04-04 ENCOUNTER — Ambulatory Visit: Payer: Self-pay | Admitting: Internal Medicine

## 2024-04-05 ENCOUNTER — Encounter: Payer: Self-pay | Admitting: *Deleted

## 2024-04-05 ENCOUNTER — Ambulatory Visit
Admission: EM | Admit: 2024-04-05 | Discharge: 2024-04-05 | Disposition: A | Discharge status: Home / Self Care | Attending: Student | Admitting: Student

## 2024-04-05 ENCOUNTER — Other Ambulatory Visit: Payer: Self-pay

## 2024-04-05 DIAGNOSIS — H6992 Unspecified Eustachian tube disorder, left ear: Secondary | ICD-10-CM | POA: Diagnosis not present

## 2024-04-05 DIAGNOSIS — S134XXA Sprain of ligaments of cervical spine, initial encounter: Secondary | ICD-10-CM

## 2024-04-05 MED ORDER — PREDNISONE 10 MG PO TABS: 20.0000 mg | ORAL_TABLET | Freq: Every day | ORAL | 0 refills | Status: AC
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
## 2024-04-05 NOTE — Discharge Instructions (Addendum)
-  Prednisone , 2 pills taken at the same time for 5 days in a row.   -Tylenol  up to 1000mg  3x daily for pain -Continue benadryl for allergie

## 2024-04-09 DIAGNOSIS — H0102B Squamous blepharitis left eye, upper and lower eyelids: Secondary | ICD-10-CM | POA: Diagnosis not present

## 2024-04-09 DIAGNOSIS — H401131 Primary open-angle glaucoma, bilateral, mild stage: Secondary | ICD-10-CM | POA: Diagnosis not present

## 2024-04-09 DIAGNOSIS — H35373 Puckering of macula, bilateral: Secondary | ICD-10-CM | POA: Diagnosis not present

## 2024-04-09 DIAGNOSIS — H0102A Squamous blepharitis right eye, upper and lower eyelids: Secondary | ICD-10-CM | POA: Diagnosis not present

## 2024-04-22 DIAGNOSIS — R0789 Other chest pain: Secondary | ICD-10-CM | POA: Diagnosis not present

## 2024-04-30 DIAGNOSIS — M75102 Unspecified rotator cuff tear or rupture of left shoulder, not specified as traumatic: Secondary | ICD-10-CM | POA: Diagnosis not present

## 2024-04-30 DIAGNOSIS — M12812 Other specific arthropathies, not elsewhere classified, left shoulder: Secondary | ICD-10-CM | POA: Diagnosis not present

## 2024-05-04 ENCOUNTER — Other Ambulatory Visit: Payer: Self-pay | Admitting: Internal Medicine

## 2024-05-17 ENCOUNTER — Ambulatory Visit: Payer: Self-pay

## 2024-05-17 NOTE — Telephone Encounter (Signed)
 FYI Only or Action Required?: Action required by provider: update on patient condition.  Patient was last seen in primary care on 04/01/2024 by Plotnikov, Karlynn GAILS, MD.  Called Nurse Triage reporting Rib Injury.  Symptoms began about a month ago.  Interventions attempted: OTC medications: tylenol , UC visit.  Symptoms are: gradually improving.  Triage Disposition: See PCP When Office is Open (Within 3 Days)  Patient/caregiver understands and will follow disposition?: No, wishes to speak with PCP  Copied from CRM #[id number]. Topic: Clinical - Red Word Triage >> May 17, 2024 12:55 PM Ashley R wrote: Red Word that prompted transfer to Nurse Triage: fall around one month ago, went to urgent care- xray (no fracture) back pain left side.   ----------------------------------------------------------------------- From previous Reason for Contact - Scheduling: Patient/patient representative is calling to schedule an appointment. Refer to attachments for appointment information. Reason for Disposition  [1] Chest wall swelling or pain AND [2] present > 7 days  Answer Assessment - Initial Assessment Questions 1. MECHANISM: How did the injury happen?     fall 2. ONSET: When did the injury happen? (.e.g., minutes, hours, days ago)     One month ago  3. LOCATION: Where on the chest is the injury located?     Back of right rib 4. APPEARANCE: What does the injury look like?     All bruising has resolved 5. BLEEDING: Is there any bleeding now? If Yes, ask: How long has it been bleeding?     denies 6. SEVERITY: Any difficulty with breathing?     denies 7. SIZE: For cuts, bruises, or swelling, ask: How large is it? (e.g., inches or centimeters)     N/a 8. PAIN: Is there pain? If Yes, ask: How bad is the pain? (e.g., Scale 0-10; none, mild, moderate, severe)     Describes as nagging pain  Protocols used: Chest Injury-A-AH

## 2024-05-18 NOTE — Telephone Encounter (Signed)
 Please schedule office visit with any provider.  Thank

## 2024-05-21 DIAGNOSIS — H0102B Squamous blepharitis left eye, upper and lower eyelids: Secondary | ICD-10-CM | POA: Diagnosis not present

## 2024-05-21 DIAGNOSIS — H0102A Squamous blepharitis right eye, upper and lower eyelids: Secondary | ICD-10-CM | POA: Diagnosis not present

## 2024-05-21 DIAGNOSIS — Z961 Presence of intraocular lens: Secondary | ICD-10-CM | POA: Diagnosis not present

## 2024-05-21 DIAGNOSIS — H401131 Primary open-angle glaucoma, bilateral, mild stage: Secondary | ICD-10-CM | POA: Diagnosis not present

## 2024-05-24 ENCOUNTER — Other Ambulatory Visit: Payer: Self-pay | Admitting: Internal Medicine

## 2024-05-26 DIAGNOSIS — K080 Exfoliation of teeth due to systemic causes: Secondary | ICD-10-CM | POA: Diagnosis not present

## 2024-06-30 ENCOUNTER — Ambulatory Visit: Admitting: Internal Medicine

## 2024-06-30 ENCOUNTER — Encounter: Payer: Self-pay | Admitting: Internal Medicine

## 2024-06-30 VITALS — BP 110/62 | HR 58 | Temp 97.8°F | Ht 66.0 in | Wt 149.0 lb

## 2024-06-30 DIAGNOSIS — S2231XS Fracture of one rib, right side, sequela: Secondary | ICD-10-CM

## 2024-06-30 DIAGNOSIS — Z7184 Encounter for health counseling related to travel: Secondary | ICD-10-CM

## 2024-06-30 MED ORDER — AZITHROMYCIN 250 MG PO TABS: ORAL_TABLET | ORAL | 0 refills | Status: AC

## 2024-06-30 NOTE — Progress Notes (Unsigned)
 "  Subjective:  Patient ID: Edwin Ramirez, male    DOB: 1941-01-06  Age: 84 y.o. MRN: [id number]  CC: Follow-up (rib injury one month ago)   HPI Edwin Ramirez presents for possible rib fx s/p fall - healed: it is a follow-up visit F/u R shoulder pain  Outpatient Medications Prior to Visit  Medication Sig Dispense Refill   b complex vitamins tablet Take 1 tablet by mouth daily. 100 tablet 3   brimonidine (ALPHAGAN) 0.2 % ophthalmic solution Place 1 drop into both eyes.     BRIMONIDINE TARTRATE OP Apply to eye.     Cholecalciferol (VITAMIN D3) 50 MCG (2000 UT) capsule Take 1 capsule (2,000 Units total) by mouth daily. 100 capsule 3   dicyclomine  (BENTYL ) 10 MG capsule TAKE 1 CAPSULE BY MOUTH 4 TIMES DAILY, BEFORE MEALS AND AT BEDTIME. 100 capsule 1   fluticasone  (FLONASE ) 50 MCG/ACT nasal spray Place 2 sprays into both nostrils daily. 16 g 6   HYDROcodone  bit-homatropine (HYCODAN) 5-1.5 MG/5ML syrup Take 5 mLs by mouth at bedtime as needed for cough. 120 mL 0   latanoprost (XALATAN) 0.005 % ophthalmic solution Place 1 drop into both eyes at bedtime.     LORazepam  (ATIVAN ) 1 MG tablet Take 1 tablet (1 mg total) by mouth every 8 (eight) hours as needed. for anxiety 90 tablet 2   methocarbamol  (ROBAXIN ) 500 MG tablet Take 1 tablet (500 mg total) by mouth every 8 (eight) hours as needed for muscle spasms. 30 tablet 1   mupirocin  ointment (BACTROBAN ) 2 % On wound w/dressing change bid 30 g 0   omeprazole  (PRILOSEC) 40 MG capsule TAKE 1 CAPSULE BY MOUTH TWICE A DAY 180 capsule 2   rosuvastatin  (CRESTOR ) 10 MG tablet TAKE 1 TABLET BY MOUTH EVERY DAY 90 tablet 3   traMADol  (ULTRAM ) 50 MG tablet Take 1 tablet (50 mg total) by mouth every 6 (six) hours as needed for severe pain. 20 tablet 0   traZODone  (DESYREL ) 50 MG tablet TAKE 1 TO 2 TABLETS BY MOUTH AT BEDTIME AS NEEDED FOR SLEEP 60 tablet 5   doxycycline  (VIBRA -TABS) 100 MG tablet Take 1 tablet (100 mg total) by mouth  2 (two) times daily. 20 tablet 0   No facility-administered medications prior to visit.    ROS: Review of Systems  Constitutional:  Negative for appetite change, fatigue and unexpected weight change.  HENT:  Negative for congestion, nosebleeds, sneezing, sore throat and trouble swallowing.   Eyes:  Negative for itching and visual disturbance.  Respiratory:  Negative for cough.   Cardiovascular:  Negative for chest pain, palpitations and leg swelling.  Gastrointestinal:  Negative for abdominal distention, blood in stool, diarrhea and nausea.  Genitourinary:  Negative for frequency and hematuria.  Musculoskeletal:  Negative for back pain, gait problem, joint swelling and neck pain.  Skin:  Negative for rash.  Neurological:  Negative for dizziness, tremors, speech difficulty and weakness.  Psychiatric/Behavioral:  Negative for agitation, dysphoric mood and sleep disturbance. The patient is not nervous/anxious.     Objective:  BP 110/62   Pulse (!) 58   Temp 97.8 F (36.6 C) (Oral)   Ht 5' 6 (1.676 m)   Wt 149 lb (67.6 kg)   SpO2 93%   BMI 24.05 kg/m   BP Readings from Last 3 Encounters:  06/30/24 110/62  04/05/24 100/61  04/01/24 110/60    Wt Readings from Last 3 Encounters:  06/30/24 149 lb (67.6 kg)  04/01/24 150 lb  6.4 oz (68.2 kg)  03/29/24 151 lb (68.5 kg)    Physical Exam Constitutional:      General: He is not in acute distress.    Appearance: Normal appearance. He is well-developed.     Comments: NAD  Eyes:     Conjunctiva/sclera: Conjunctivae normal.     Pupils: Pupils are equal, round, and reactive to light.  Neck:     Thyroid : No thyromegaly.     Vascular: No JVD.  Cardiovascular:     Rate and Rhythm: Normal rate and regular rhythm.     Heart sounds: Normal heart sounds. No murmur heard.    No friction rub. No gallop.  Pulmonary:     Effort: Pulmonary effort is normal. No respiratory distress.     Breath sounds: Normal breath sounds. No wheezing or  rales.  Chest:     Chest wall: No tenderness.  Abdominal:     General: Bowel sounds are normal. There is no distension.     Palpations: Abdomen is soft. There is no mass.     Tenderness: There is no abdominal tenderness. There is no guarding or rebound.  Musculoskeletal:        General: No tenderness. Normal range of motion.     Cervical back: Normal range of motion.     Right lower leg: No edema.     Left lower leg: No edema.  Lymphadenopathy:     Cervical: No cervical adenopathy.  Skin:    General: Skin is warm and dry.     Findings: No rash.  Neurological:     Mental Status: He is alert and oriented to person, place, and time.     Cranial Nerves: No cranial nerve deficit.     Motor: No abnormal muscle tone.     Coordination: Coordination normal.     Gait: Gait normal.     Deep Tendon Reflexes: Reflexes are normal and symmetric.  Psychiatric:        Behavior: Behavior normal.        Thought Content: Thought content normal.        Judgment: Judgment normal.   L shoulder w/ some pain on range of motion Chest wall is nontender  Lab Results  Component Value Date   WBC 3.9 (L) 04/02/2024   HGB 12.4 (L) 04/02/2024   HCT 37.7 (L) 04/02/2024   PLT 151.0 04/02/2024   GLUCOSE 88 04/02/2024   CHOL 126 04/02/2024   TRIG 107.0 04/02/2024   HDL 42.70 04/02/2024   LDLDIRECT 78.8 02/05/2011   LDLCALC 62 04/02/2024   ALT 18 04/02/2024   AST 21 04/02/2024   NA 142 04/02/2024   K 4.1 04/02/2024   CL 105 04/02/2024   CREATININE 1.17 04/02/2024   BUN 19 04/02/2024   CO2 28 04/02/2024   TSH 1.74 04/02/2024   PSA 0.00 (L) 04/02/2024   HGBA1C 5.7 03/07/2014    No results found.  Assessment & Plan:   Problem List Items Addressed This Visit   None Visit Diagnoses       Closed fracture of one rib of right side, sequela    -  Primary         Meds ordered this encounter  Medications   azithromycin  (ZITHROMAX  Z-PAK) 250 MG tablet    Sig: As directed    Dispense:  6  tablet    Refill:  0      Follow-up: Return in about 6 months (around 12/28/2024) for Wellness Exam.  Marolyn  Steffie Waggoner, MD "

## 2024-07-04 DIAGNOSIS — S2231XA Fracture of one rib, right side, initial encounter for closed fracture: Secondary | ICD-10-CM | POA: Insufficient documentation

## 2024-07-04 DIAGNOSIS — Z7184 Encounter for health counseling related to travel: Secondary | ICD-10-CM | POA: Insufficient documentation

## 2024-07-04 NOTE — Assessment & Plan Note (Signed)
 Healed.  No symptoms at present

## 2024-07-04 NOTE — Assessment & Plan Note (Signed)
 Traveling to [place] for a river cruise.  Z-Pak prescription provided

## 2024-09-08 ENCOUNTER — Ambulatory Visit: Admitting: Internal Medicine

## 2025-04-05 ENCOUNTER — Ambulatory Visit

## 2025-04-05 ENCOUNTER — Encounter: Admitting: Internal Medicine
